# Patient Record
Sex: Male | Born: 1955 | Race: White | Hispanic: No | Smoking: Never smoker
Health system: Southern US, Community
[De-identification: ages and names within clinical notes are randomized; demographics above are authoritative.]

## PROBLEM LIST (undated history)

## (undated) DIAGNOSIS — M79673 Pain in unspecified foot: Secondary | ICD-10-CM

## (undated) DIAGNOSIS — E119 Type 2 diabetes mellitus without complications: Secondary | ICD-10-CM

## (undated) DIAGNOSIS — F419 Anxiety disorder, unspecified: Secondary | ICD-10-CM

## (undated) DIAGNOSIS — E7800 Pure hypercholesterolemia, unspecified: Secondary | ICD-10-CM

## (undated) DIAGNOSIS — B1920 Unspecified viral hepatitis C without hepatic coma: Secondary | ICD-10-CM

## (undated) HISTORY — DX: Unspecified viral hepatitis C without hepatic coma: B19.20

## (undated) HISTORY — DX: Pure hypercholesterolemia, unspecified: E78.00

## (undated) HISTORY — DX: Anxiety disorder, unspecified: F41.9

## (undated) HISTORY — PX: OTHER SURGICAL HISTORY: SHX169

## (undated) HISTORY — DX: Pain in unspecified foot: M79.673

## (undated) HISTORY — DX: Type 2 diabetes mellitus without complications: E11.9

---

## 2009-03-16 ENCOUNTER — Emergency Department (HOSPITAL_COMMUNITY): Admission: EM | Admit: 2009-03-16 | Discharge: 2009-03-16 | Payer: Self-pay | Admitting: Emergency Medicine

## 2009-05-02 ENCOUNTER — Encounter: Admission: RE | Admit: 2009-05-02 | Discharge: 2009-05-02 | Payer: Self-pay | Admitting: Family Medicine

## 2009-06-30 ENCOUNTER — Encounter: Admission: RE | Admit: 2009-06-30 | Discharge: 2009-06-30 | Payer: Self-pay | Admitting: Family Medicine

## 2010-04-13 ENCOUNTER — Emergency Department (HOSPITAL_COMMUNITY): Admission: EM | Admit: 2010-04-13 | Discharge: 2010-04-13 | Payer: Self-pay | Admitting: Emergency Medicine

## 2010-08-22 LAB — URINALYSIS, ROUTINE W REFLEX MICROSCOPIC
Bilirubin Urine: NEGATIVE
Glucose, UA: NEGATIVE mg/dL
Hgb urine dipstick: NEGATIVE
Ketones, ur: NEGATIVE mg/dL
Nitrite: NEGATIVE
Protein, ur: NEGATIVE mg/dL
Specific Gravity, Urine: 1.014 (ref 1.005–1.030)
Urobilinogen, UA: 1 mg/dL (ref 0.0–1.0)
pH: 6 (ref 5.0–8.0)

## 2010-08-22 LAB — CBC
HCT: 41 % (ref 39.0–52.0)
Hemoglobin: 14.3 g/dL (ref 13.0–17.0)
MCH: 32.2 pg (ref 26.0–34.0)
MCHC: 34.9 g/dL (ref 30.0–36.0)
MCV: 92.3 fL (ref 78.0–100.0)
Platelets: 103 K/uL — ABNORMAL LOW (ref 150–400)
RBC: 4.44 MIL/uL (ref 4.22–5.81)
RDW: 14 % (ref 11.5–15.5)
WBC: 4.9 10*3/uL (ref 4.0–10.5)

## 2010-08-22 LAB — DIFFERENTIAL
Basophils Absolute: 0 K/uL (ref 0.0–0.1)
Basophils Relative: 0 % (ref 0–1)
Eosinophils Absolute: 0.1 10*3/uL (ref 0.0–0.7)
Eosinophils Relative: 1 % (ref 0–5)
Lymphocytes Relative: 31 % (ref 12–46)
Lymphs Abs: 1.6 10*3/uL (ref 0.7–4.0)
Monocytes Absolute: 0.3 K/uL (ref 0.1–1.0)
Monocytes Relative: 6 % (ref 3–12)
Neutro Abs: 3 K/uL (ref 1.7–7.7)
Neutrophils Relative %: 61 % (ref 43–77)

## 2010-08-22 LAB — COMPREHENSIVE METABOLIC PANEL WITH GFR
Albumin: 2.8 g/dL — ABNORMAL LOW (ref 3.5–5.2)
Alkaline Phosphatase: 120 U/L — ABNORMAL HIGH (ref 39–117)
BUN: 6 mg/dL (ref 6–23)
Chloride: 106 meq/L (ref 96–112)
Creatinine, Ser: 0.77 mg/dL (ref 0.4–1.5)
Glucose, Bld: 82 mg/dL (ref 70–99)
Potassium: 3.2 meq/L — ABNORMAL LOW (ref 3.5–5.1)
Total Bilirubin: 0.9 mg/dL (ref 0.3–1.2)

## 2010-08-22 LAB — COMPREHENSIVE METABOLIC PANEL
ALT: 177 U/L — ABNORMAL HIGH (ref 0–53)
AST: 206 U/L — ABNORMAL HIGH (ref 0–37)
CO2: 26 mEq/L (ref 19–32)
Calcium: 8.6 mg/dL (ref 8.4–10.5)
GFR calc Af Amer: >60 mL/min (ref >60)
GFR calc non Af Amer: >60 mL/min (ref >60)
Sodium: 138 mEq/L (ref 135–145)
Total Protein: 7.9 g/dL (ref 6.0–8.3)

## 2010-08-22 LAB — LIPASE, BLOOD: Lipase: 41 U/L (ref 11–59)

## 2010-08-22 LAB — GLUCOSE, CAPILLARY: Glucose-Capillary: 76 mg/dL (ref 70–99)

## 2011-03-19 DIAGNOSIS — F329 Major depressive disorder, single episode, unspecified: Secondary | ICD-10-CM | POA: Insufficient documentation

## 2011-06-06 DIAGNOSIS — D61818 Other pancytopenia: Secondary | ICD-10-CM | POA: Insufficient documentation

## 2011-08-14 DIAGNOSIS — E1140 Type 2 diabetes mellitus with diabetic neuropathy, unspecified: Secondary | ICD-10-CM | POA: Insufficient documentation

## 2011-08-14 DIAGNOSIS — B351 Tinea unguium: Secondary | ICD-10-CM | POA: Insufficient documentation

## 2011-08-20 DIAGNOSIS — E291 Testicular hypofunction: Secondary | ICD-10-CM | POA: Insufficient documentation

## 2011-09-12 IMAGING — CR DG WRIST COMPLETE 3+V*R*
4 series · 4 of 4 positions shown · non-contrast
Comparison: None available.

CLINICAL DATA: Status post fall.  Pain.

RIGHT WRIST - COMPLETE 3+ VIEW

[x wrist pa right]
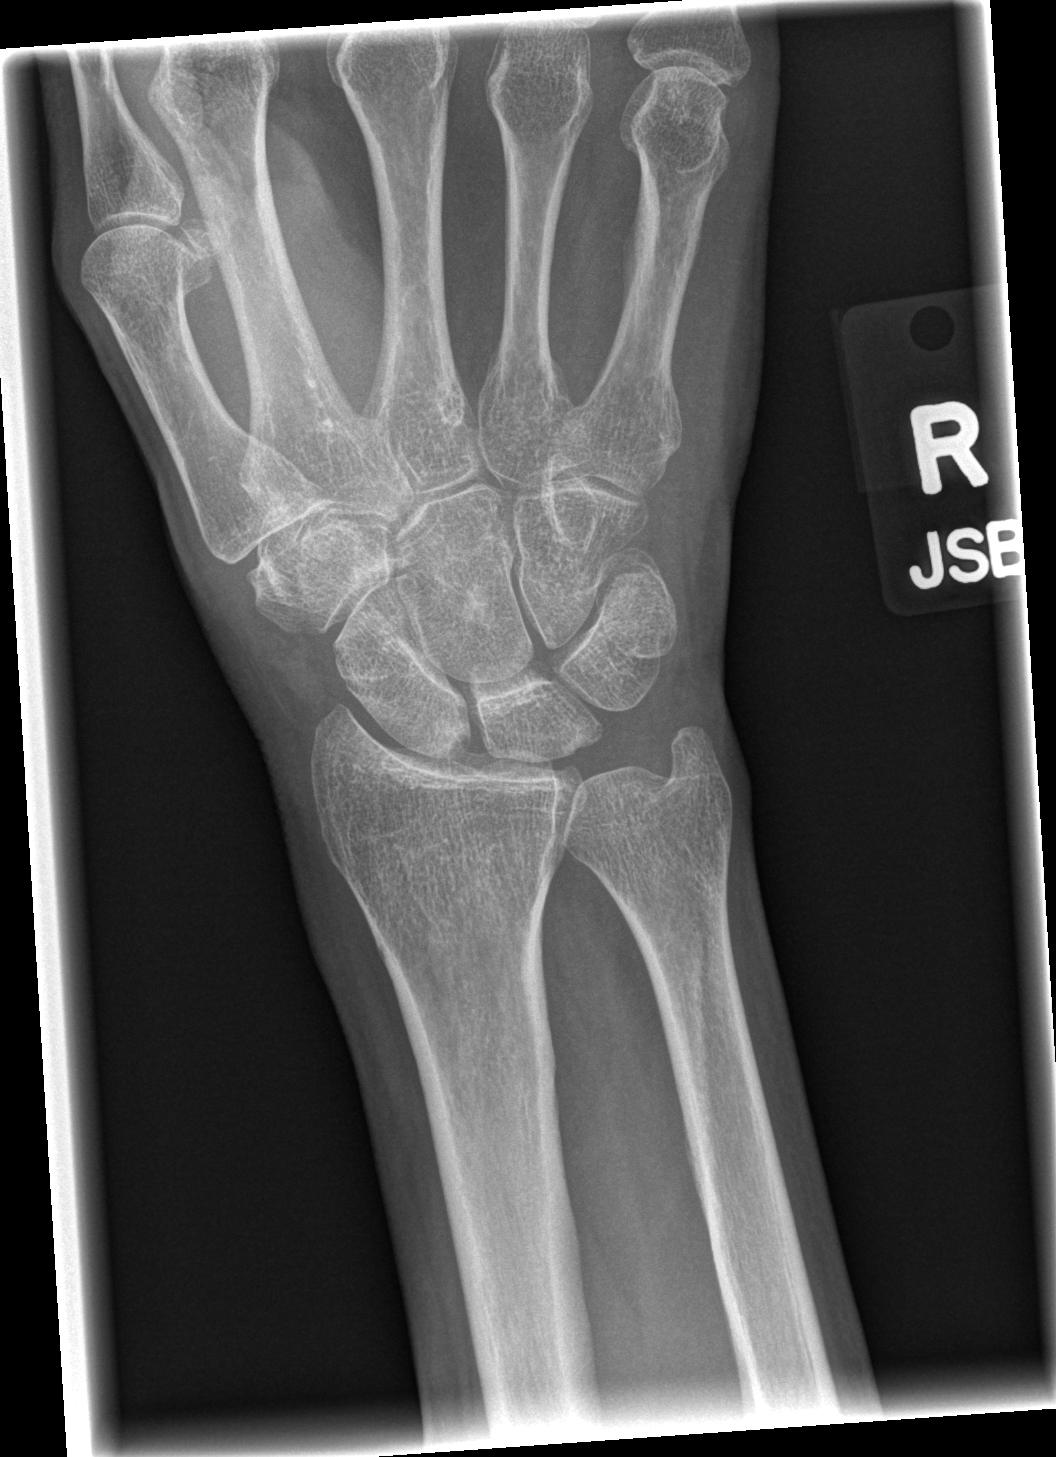

[x wrist obl right]
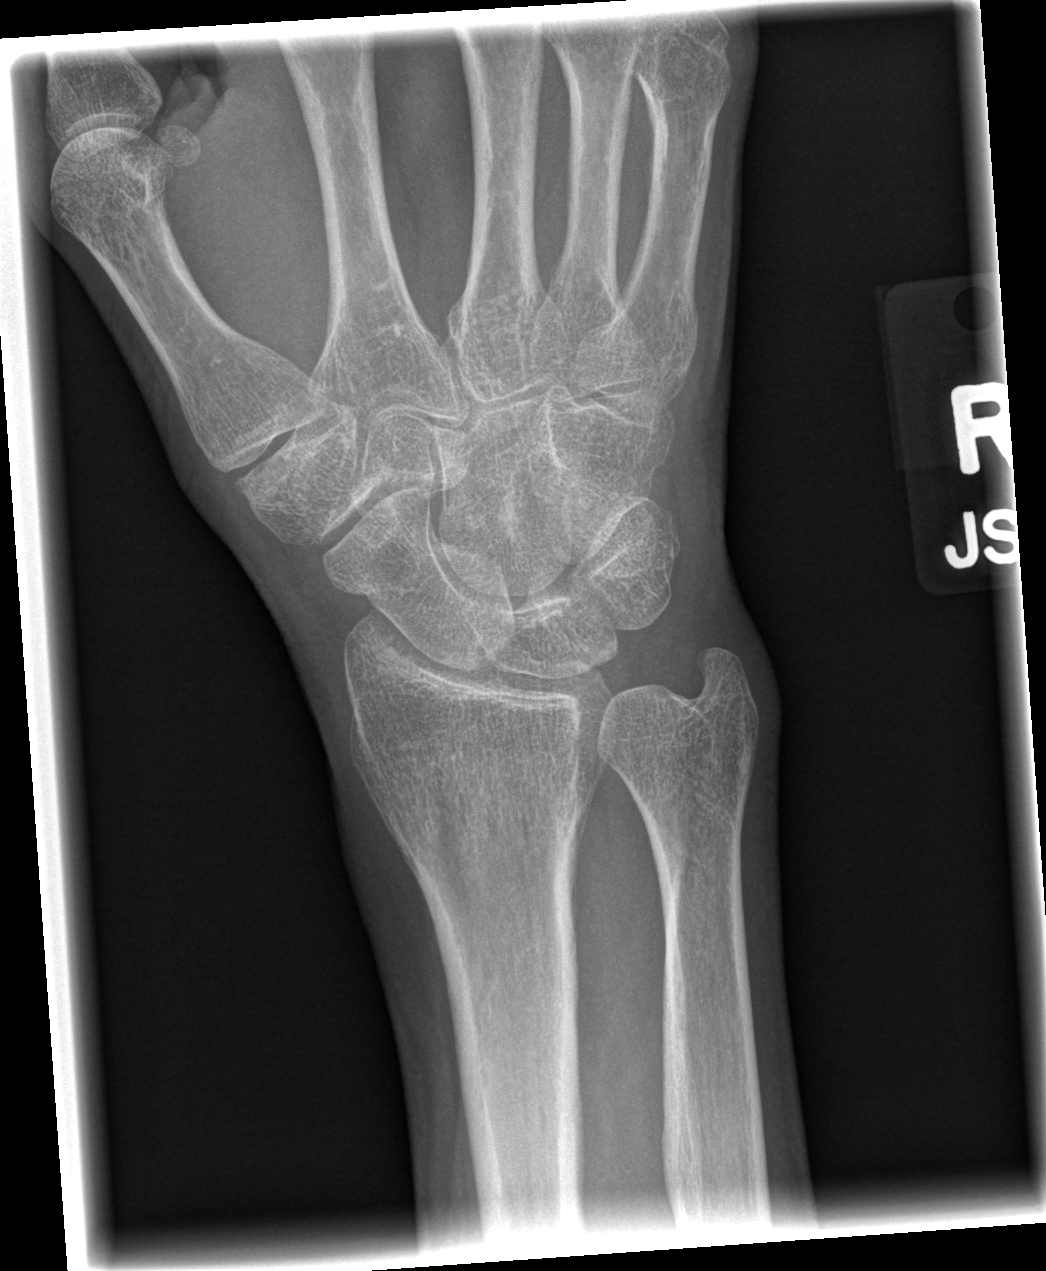

[x wrist lat right]
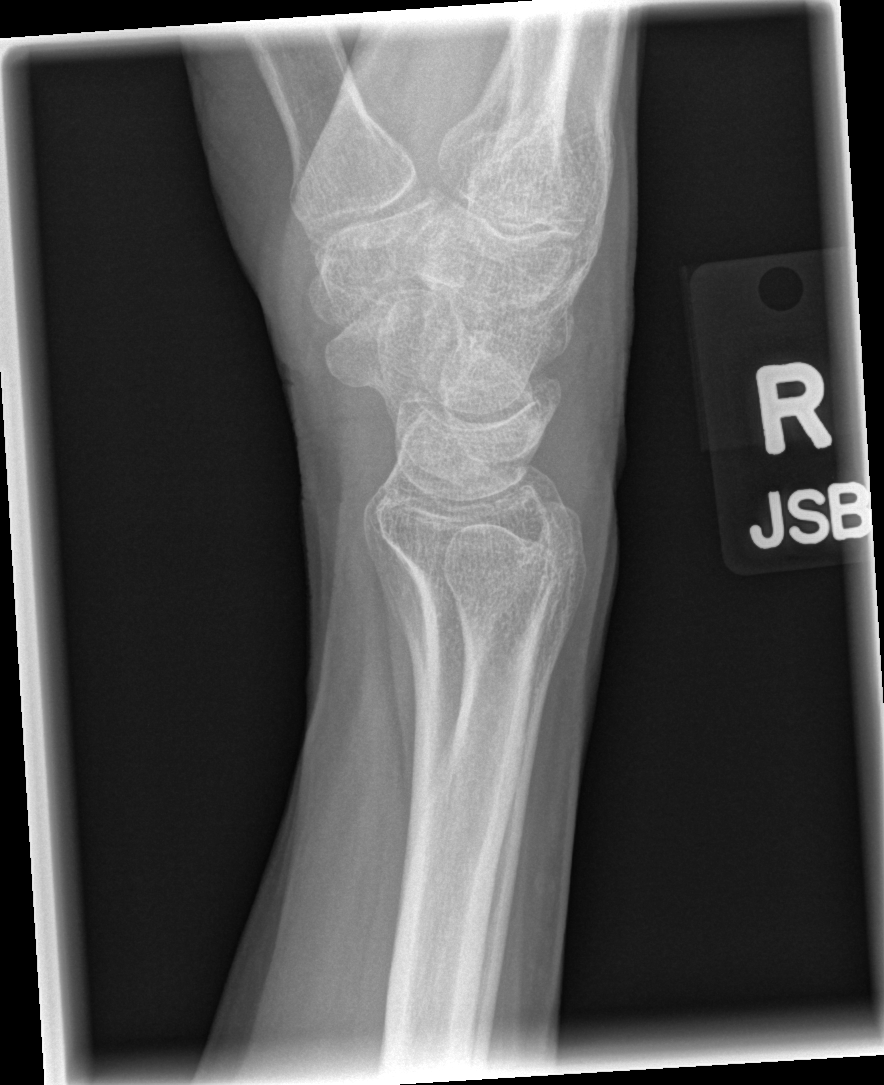

[x navicular]
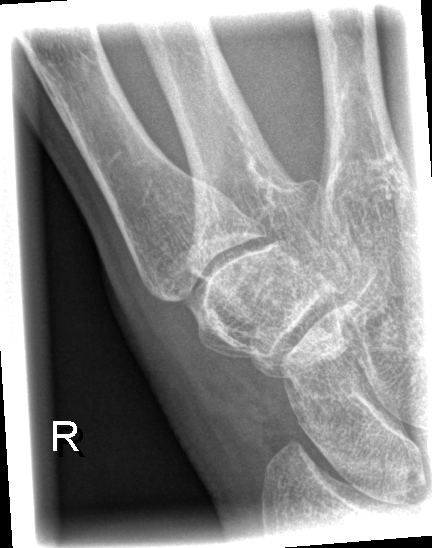

[4 of 4 positions shown; findings below may reference images not displayed]

FINDINGS: No fracture or dislocation is identified.  Bones are
osteopenic.  There is some degenerative disease of the first CMC
joint.
IMPRESSION: 1.  No acute finding.
2.  Osteopenia.
3.  First CMC osteoarthritis.

## 2012-03-17 DIAGNOSIS — K921 Melena: Secondary | ICD-10-CM | POA: Insufficient documentation

## 2012-05-14 DIAGNOSIS — E559 Vitamin D deficiency, unspecified: Secondary | ICD-10-CM | POA: Insufficient documentation

## 2012-10-17 DIAGNOSIS — R252 Cramp and spasm: Secondary | ICD-10-CM | POA: Insufficient documentation

## 2012-12-01 DIAGNOSIS — L089 Local infection of the skin and subcutaneous tissue, unspecified: Secondary | ICD-10-CM | POA: Insufficient documentation

## 2013-06-12 ENCOUNTER — Telehealth: Payer: Self-pay | Admitting: Internal Medicine

## 2013-06-12 NOTE — Telephone Encounter (Signed)
S/W PT AND GVE NP APPT 01/20 @ 10:30 W/DR. CHISM REFERRING DR. Melina SchoolsOBYN [name] DX- HEMOCHROMATOSIS WELCOME PACKET MAILED.

## 2013-06-15 ENCOUNTER — Telehealth: Payer: Self-pay | Admitting: Internal Medicine

## 2013-06-15 NOTE — Telephone Encounter (Signed)
C/D 06/15/13 for appt. 06/30/13

## 2013-06-29 ENCOUNTER — Encounter: Payer: Self-pay | Admitting: Neurology

## 2013-06-29 ENCOUNTER — Ambulatory Visit (INDEPENDENT_AMBULATORY_CARE_PROVIDER_SITE_OTHER): Payer: Medicaid Other | Admitting: Neurology

## 2013-06-29 VITALS — BP 111/77 | HR 64 | Ht 72.0 in | Wt 163.0 lb

## 2013-06-29 DIAGNOSIS — B1920 Unspecified viral hepatitis C without hepatic coma: Secondary | ICD-10-CM

## 2013-06-29 DIAGNOSIS — F419 Anxiety disorder, unspecified: Secondary | ICD-10-CM | POA: Insufficient documentation

## 2013-06-29 DIAGNOSIS — E119 Type 2 diabetes mellitus without complications: Secondary | ICD-10-CM

## 2013-06-29 DIAGNOSIS — F411 Generalized anxiety disorder: Secondary | ICD-10-CM

## 2013-06-29 DIAGNOSIS — E7800 Pure hypercholesterolemia, unspecified: Secondary | ICD-10-CM | POA: Insufficient documentation

## 2013-06-29 NOTE — Progress Notes (Signed)
[facility]  PATIENT: John [name] DOB: May 17, 1956  HISTORICAL  John [name] is a 58 years old Caucasian male, referred by his primary care physician Dr. 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 Multiple echogenic nodules within a heterogenous liver parenchyma. Cholelithiasis with tumefactive sludge. Persistent gallbladder wall thickening is likely related to underlying liver disease.   He was previously evaluated by neurologists at [facility], was given prescription of gabapentin, which does help him some    He also complains of right hip pain, difficulty crossing his legs because of hip pain, radiating pain to his right groin area   REVIEW OF SYSTEMS: Full 14 system review of systems performed and notable only for feeling cold, confusion, numbness, weakness, cramps, insomnia, restless leg, not enough sleep, decreased energy, change in appetite  ALLERGIES: No Known Allergies  PAST MEDICAL HISTORY: Past Medical History  Diagnosis Date  . Diabetes   . High cholesterol   . Anxiety   . Hepatitis C   hereditary hemochromatosis   PAST SURGICAL HISTORY: Past Surgical History    Procedure Laterality Date  . None      FAMILY HISTORY: Adopted  SOCIAL HISTORY:  History   Social History  . Marital Status: Single    Spouse Name: N/A    Number of Children: 4  . Years of Education: 70   Occupational History    Disabled in 2007   Social History Main Topics  . Smoking status: Never Smoker   . Smokeless tobacco: Never Used  . Alcohol Use: No     Comment: Quit 2007  . Drug Use: No     Comment: Quit 1980  . Sexual Activity: Not on file   Other Topics Concern  . Not on file   Social History Narrative   Patient lives at home with his girlfriend.    Patient is disabled.   Right handed.   Caffeine- 1-2 cups of caffeine daily.   Education- two years of college.          PHYSICAL EXAM   Filed Vitals:   06/29/13 1052  BP: 111/77  Pulse: 64  Height: 6' (1.829 m)  Weight: 163 lb (73.936 kg)     Body mass index is 22.1 kg/(m^2).   Generalized: In no acute distress  Neck: Supple, no carotid bruits   Cardiac: Regular rate rhythm  Pulmonary: Clear to auscultation bilaterally  Musculoskeletal: No deformity  Neurological examination  Mentation: unkempt, Alert oriented to time, place, history taking, and causual conversation  Cranial nerve II-XII: Pupils were equal round reactive to light extraocular movements were full, Visual field were full on confrontational test. Bilateral fundi were sharp.  Facial sensation and strength were normal. Hearing was intact to finger rubbing bilaterally. Uvula tongue midline.  head  turning and shoulder shrug and were normal and symmetric.Tongue protrusion into cheek strength was normal.  Motor: normal tone, bulk and strength.  Sensory: Intact to fine touch, pinprick, preserved vibratory sensation, and proprioception at toes.  Coordination: Normal finger to nose, heel-to-shin bilaterally there was no truncal ataxia  Gait: Rising up from seated position without assistance narrow-base, mild atalgic due to  right hip pain  Romberg signs: Negative  Deep tendon reflexes: Brachioradialis 2/2, biceps 2/2, triceps 2/2, patellar 2/2, Achilles 2/2, plantar responses were flexor bilaterally.   DIAGNOSTIC DATA (LABS, IMAGING, TESTING) - I reviewed patient records, labs, notes, testing and imaging myself where available.  Lab Results  Component Value Date   WBC 4.9 04/13/2010   HGB 14.3 04/13/2010   HCT 41.0 04/13/2010   MCV 92.3 04/13/2010   PLT 103* 04/13/2010      Component Value Date/Time   NA 138 04/13/2010 1417   K 3.2* 04/13/2010 1417   CL 106 04/13/2010 1417   CO2 26 04/13/2010 1417   GLUCOSE 82 04/13/2010 1417   BUN 6 04/13/2010 1417   CREATININE 0.77 04/13/2010 1417   CALCIUM 8.6 04/13/2010 1417   PROT 7.9 04/13/2010 1417   ALBUMIN 2.8* 04/13/2010 1417   AST 206* 04/13/2010 1417   ALT 177* 04/13/2010 1417   ALKPHOS 120* 04/13/2010 1417   BILITOT 0.9 04/13/2010 1417   GFRNONAA >60 04/13/2010 1417   GFRAA  Value: >60        The eGFR has been calculated using the MDRD equation. This calculation has not been validated in all clinical situations. eGFR's persistently <60 mL/min signify possible Chronic Kidney Disease. 04/13/2010 1417     ASSESSMENT AND PLAN    58 years old Caucasian male, presenting with frequent motor and voice tics, in the setting of hemochromatosis, stage IV hepatic cirrhosis, hepatitis C,   1.  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  .    4 right hip pain, x-ray of right hip 5.Return to clinic in 2-3 months.     Marcial Pacas, M.D. Ph.D.  [facility] [address], [place], [place] [address] [phone]

## 2013-06-30 ENCOUNTER — Ambulatory Visit: Payer: Medicaid Other

## 2013-06-30 ENCOUNTER — Other Ambulatory Visit (HOSPITAL_BASED_OUTPATIENT_CLINIC_OR_DEPARTMENT_OTHER): Payer: Medicaid Other

## 2013-06-30 ENCOUNTER — Encounter: Payer: Self-pay | Admitting: Internal Medicine

## 2013-06-30 ENCOUNTER — Other Ambulatory Visit: Payer: Self-pay | Admitting: Internal Medicine

## 2013-06-30 ENCOUNTER — Ambulatory Visit (HOSPITAL_BASED_OUTPATIENT_CLINIC_OR_DEPARTMENT_OTHER): Payer: Medicaid Other | Admitting: Internal Medicine

## 2013-06-30 ENCOUNTER — Telehealth: Payer: Self-pay | Admitting: Internal Medicine

## 2013-06-30 DIAGNOSIS — K7460 Unspecified cirrhosis of liver: Secondary | ICD-10-CM

## 2013-06-30 DIAGNOSIS — E7800 Pure hypercholesterolemia, unspecified: Secondary | ICD-10-CM

## 2013-06-30 DIAGNOSIS — B1920 Unspecified viral hepatitis C without hepatic coma: Secondary | ICD-10-CM

## 2013-06-30 DIAGNOSIS — B182 Chronic viral hepatitis C: Secondary | ICD-10-CM

## 2013-06-30 DIAGNOSIS — E119 Type 2 diabetes mellitus without complications: Secondary | ICD-10-CM

## 2013-06-30 LAB — COMPREHENSIVE METABOLIC PANEL (CC13)
ALBUMIN: 2.3 g/dL — AB (ref 3.5–5.0)
ALK PHOS: 171 U/L — AB (ref 40–150)
ALT: 103 U/L — ABNORMAL HIGH (ref 0–55)
ANION GAP: 4 meq/L (ref 3–11)
AST: 137 U/L — ABNORMAL HIGH (ref 5–34)
BILIRUBIN TOTAL: 0.74 mg/dL (ref 0.20–1.20)
BUN: 11.3 mg/dL (ref 7.0–26.0)
CO2: 29 mEq/L (ref 22–29)
Calcium: 8.7 mg/dL (ref 8.4–10.4)
Chloride: 100 mEq/L (ref 98–109)
Creatinine: 0.9 mg/dL (ref 0.7–1.3)
GLUCOSE: 373 mg/dL — AB (ref 70–140)
Potassium: 4.5 mEq/L (ref 3.5–5.1)
Sodium: 133 mEq/L — ABNORMAL LOW (ref 136–145)
Total Protein: 7.4 g/dL (ref 6.4–8.3)

## 2013-06-30 LAB — CBC WITH DIFFERENTIAL/PLATELET
BASO%: 0 % (ref 0.0–2.0)
BASOS ABS: 0 10*3/uL (ref 0.0–0.1)
EOS%: 3.3 % (ref 0.0–7.0)
Eosinophils Absolute: 0.1 10*3/uL (ref 0.0–0.5)
HCT: 39.6 % (ref 38.4–49.9)
HGB: 13.7 g/dL (ref 13.0–17.1)
LYMPH#: 1 10*3/uL (ref 0.9–3.3)
LYMPH%: 30.5 % (ref 14.0–49.0)
MCH: 33.1 pg (ref 27.2–33.4)
MCHC: 34.6 g/dL (ref 32.0–36.0)
MCV: 95.7 fL (ref 79.3–98.0)
MONO#: 0.3 10*3/uL (ref 0.1–0.9)
MONO%: 9.6 % (ref 0.0–14.0)
NEUT%: 56.6 % (ref 39.0–75.0)
NEUTROS ABS: 1.9 10*3/uL (ref 1.5–6.5)
Platelets: DECREASED 10*3/uL (ref 140–400)
RBC: 4.14 10*6/uL — AB (ref 4.20–5.82)
RDW: 14.6 % (ref 11.0–14.6)
WBC: 3.3 10*3/uL — AB (ref 4.0–10.3)
nRBC: 0 % (ref 0–0)

## 2013-06-30 LAB — IRON AND TIBC CHCC
%SAT: 61 % — ABNORMAL HIGH (ref 20–55)
IRON: 196 ug/dL — AB (ref 42–163)
TIBC: 324 ug/dL (ref 202–409)
UIBC: 128 ug/dL (ref 117–376)

## 2013-06-30 LAB — LACTATE DEHYDROGENASE (CC13): LDH: 252 U/L — ABNORMAL HIGH (ref 125–245)

## 2013-06-30 LAB — FERRITIN CHCC: FERRITIN: 379 ng/mL — AB (ref 22–316)

## 2013-06-30 NOTE — Telephone Encounter (Signed)
Gave pt appt for lab and md for february 2015

## 2013-06-30 NOTE — Patient Instructions (Signed)
Hemochromatosis You have hemochromatosis. This is when you have too much iron in your body. Iron is necessary in your body for many reasons. Iron works in the hemoglobin (a protein in red blood cells) which carries oxygen to your body. However, too much iron can lead to long-term health problems. When the iron builds up in the body it affects the liver, heart, and pancreas. This can lead to heart failure, diabetes, cirrhosis, cancer, impotence, and depression. Fatigue is a common complaint. Additional symptoms include headache, shortness of breath, heart irregularities, and tanning skin. The most common cause of this disease is hereditary (passed from parents). Another cause is a diet that contains too much iron. This can happen to people who eat a lot of red meat, take supplemental iron, take vitamin C, and drink alcohol. Frequent transfusions may also lead to iron overload. DIAGNOSIS  Three simple blood tests will usually make the diagnosis of hemochromatosis.  A transferrin iron saturation percentage test will measure your ability to circulate iron.  A ferritin test will measure the amount of iron you have in storage.  A hemoglobin test will measure how much iron is circulating in your blood. HOME CARE INSTRUCTIONS  Follow the diet provided for you by your caregiver. The recommended daily allowance for iron is:  15 mg per day for women ages 17 to 17.  30 mg per day for women who are pregnant or lactating.  10 mg per day for men and women ages 54 and older. It is important to follow your caregiver's instructions because you can lead a normal life with treatment. If left untreated, this disease can be life-threatening. MAKE SURE YOU:   Understand these instructions.  Will watch your condition.  Will get help right away if you are not doing well or get worse. Document Released: 05/25/2000 Document Revised: 08/20/2011 Document Reviewed: 05/28/2005 [facility] Patient Information 2014  [place], [place].  Therapeutic Phlebotomy Therapeutic phlebotomy is the controlled removal of blood from your body for the purpose of treating a medical condition. It is similar to donating blood. Usually, about a pint (470 mL) of blood is removed. The average adult has 9 to 12 pints (4.3 to 5.7 L) of blood. Therapeutic phlebotomy may be used to treat the following medical conditions:  Hemochromatosis. This is a condition in which there is too much iron in the blood.  Polycythemia vera. This is a condition in which there are too many red cells in the blood.  Porphyria cutanea tarda. This is a disease usually passed from one generation to the next (inherited). It is a condition in which an important part of hemoglobin is not made properly. This results in the build up of abnormal amounts of porphyrins in the body.  Sickle cell disease. This is an inherited disease. It is a condition in which the red blood cells form an abnormal crescent shape rather than a round shape. LET YOUR CAREGIVER KNOW ABOUT:  Allergies.  Medicines taken including herbs, eyedrops, over-the-counter medicines, and creams.  Use of steroids (by mouth or creams).  Previous problems with anesthetics or numbing medicine.  History of blood clots.  History of bleeding or blood problems.  Previous surgery.  Possibility of pregnancy, if this applies. RISKS AND COMPLICATIONS This is a simple and safe procedure. Problems are unlikely. However, problems can occur and may include:  Nausea or lightheadedness.  Low blood pressure.  Soreness, bleeding, swelling, or bruising at the needle insertion site.  Infection. BEFORE THE PROCEDURE  This is  a procedure that can be done as an outpatient. Confirm the time that you need to arrive for your procedure. Confirm whether there is a need to fast or withhold any medications. It is helpful to wear clothing with sleeves that can be raised above the elbow. A blood sample may be done  to determine the amount of red blood cells or iron in your blood. Plan ahead of time to have someone drive you home after the procedure. PROCEDURE The entire procedure from preparation through recovery takes about 1 hour. The actual collection takes about 10 to 15 minutes.  A needle will be inserted into your vein.  Tubing and a collection bag will be attached to that needle.  Blood will flow through the needle and tubing into the collection bag.  You may be asked to open and close your hand slowly and continuously during the entire collection.  Once the specified amount of blood has been removed from your body, the collection bag and tubing will be clamped.  The needle will be removed.  Pressure will be held on the site of the needle insertion to stop the bleeding. Then a bandage will be placed over the needle insertion site. AFTER THE PROCEDURE  Your recovery will be assessed and monitored. If there are no problems, as an outpatient, you should be able to go home shortly after the procedure.  Document Released: 10/30/2010 Document Revised: 08/20/2011 Document Reviewed: 10/30/2010 [facility] Patient Information 2014 [place]

## 2013-06-30 NOTE — Progress Notes (Signed)
Checked in new patient with no financial issues .he has appt card and [place] access.

## 2013-07-01 ENCOUNTER — Telehealth: Payer: Self-pay | Admitting: Internal Medicine

## 2013-07-01 ENCOUNTER — Telehealth: Payer: Self-pay | Admitting: *Deleted

## 2013-07-01 DIAGNOSIS — R251 Tremor, unspecified: Secondary | ICD-10-CM | POA: Insufficient documentation

## 2013-07-01 NOTE — Telephone Encounter (Signed)
s.w. pt and advised on 1.22.15 and 2.3.15 appt....pt ok adn aware

## 2013-07-01 NOTE — Telephone Encounter (Signed)
Per staff message and POF I have scheduled appts.  JMW  

## 2013-07-02 ENCOUNTER — Ambulatory Visit (HOSPITAL_BASED_OUTPATIENT_CLINIC_OR_DEPARTMENT_OTHER): Payer: Medicaid Other

## 2013-07-02 MED ORDER — SODIUM CHLORIDE 0.9 % IV SOLN
Freq: Once | INTRAVENOUS | Status: AC
  Administered 2013-07-02: 10:00:00 via INTRAVENOUS

## 2013-07-02 NOTE — Progress Notes (Signed)
[facility] [phone]   [phone]  NEW PATIENT EVALUATION   Name: John [name] Date: 07/02/2013 MRN: [id number] DOB: 27-Dec-1955  PCP: Alva Garnet., MD   REFERRING PHYSICIAN: Alva Garnet., MD [phone]  REASON FOR REFERRAL: Hereditary Hemochromatosis    HISTORY OF PRESENT ILLNESS:John [name] is a 58 y.o. male who is being referred for further evaluation and management for his hereditary hemochromatosis.Of note patient also has a history of hepatitis C and DM2 on insulin.  He was managed by Dr. Gardiner Coins from 2008 till presently.  He desires to follow locally for his hematology requiring bi-weekly therapeutic phlebotomy.  He noted phlebotomy times would be in excess five hours and made long commute times from [place] to [place].  Most of his care including his endocrinology and neurology remains at [facility].    He reports that he obtains therapeutic phlebotomy for hereditary hemochromatosis but notes elevated ferritin.  He also goes to [facility].  He has a history of essential tremors and goes to neurologist at [facility].  The hemachromatosis was diagnosed 7 years ago.  He also has diabetes managed by endocrinology.  He denies any recent hypoglycemic episodes.  He notes hyperglycemia recently.  His diabetes was diagnosed in 2007.    Today, he reports a limp due to his right leg hurting.  He has a right hip x-ray planned. In addition, his MR of brain is scheduled for 20th of April.  He reports an EGD done 2 years ago with varices reported.  He has been on nadolol.  His hepatitis C is managed by Dr. Dahlia Client with biannual ultrasound.  His HBA1C was 8.4.  He reports taking lactulose as needed.  He has not needed it in about one year.  He was diagnosed with the above when he moved from [place] in 2007.  He is adopted and does not know his family history.  He lives with his significant other of 13 years.  He has four children.  His  last hospitalization was during Christmas 2013 for pneumonia.    PAST MEDICAL HISTORY:  has a past medical history of Diabetes; High cholesterol; Anxiety; and Hepatitis C.     PAST SURGICAL HISTORY: Past Surgical History  Procedure Laterality Date  . None       CURRENT MEDICATIONS: has a current medication list which includes the following prescription(s): gabapentin, glucagon, insulin aspart, insulin glargine, and nadolol.   ALLERGIES: Sulfa antibiotics and Testosterone   SOCIAL HISTORY:  reports that he has never smoked. He has never used smokeless tobacco. He reports that he does not drink alcohol or use illicit drugs.   FAMILY HISTORY: family history is not on file.   LABORATORY DATA:  Results for orders placed in visit on 06/30/13 (from the past 48 hour(s))  CBC WITH DIFFERENTIAL     Status: Abnormal   Collection Time    06/30/13 10:33 AM      Result Value Range   WBC 3.3 (*) 4.0 - 10.3 10e3/uL   NEUT# 1.9  1.5 - 6.5 10e3/uL   HGB 13.7  13.0 - 17.1 g/dL   HCT 30.8  65.7 - 84.6 %   Platelets Clumped Platelets--Appears Decreased  140 - 400 10e3/uL   MCV 95.7  79.3 - 98.0 fL   MCH 33.1  27.2 - 33.4 pg   MCHC 34.6  32.0 - 36.0 g/dL   RBC 9.62 (*) 9.52 - 8.41 10e6/uL   RDW 14.6  11.0 -  14.6 %   lymph# 1.0  0.9 - 3.3 10e3/uL   MONO# 0.3  0.1 - 0.9 10e3/uL   Eosinophils Absolute 0.1  0.0 - 0.5 10e3/uL   Basophils Absolute 0.0  0.0 - 0.1 10e3/uL   NEUT% 56.6  39.0 - 75.0 %   LYMPH% 30.5  14.0 - 49.0 %   MONO% 9.6  0.0 - 14.0 %   EOS% 3.3  0.0 - 7.0 %   BASO% 0.0  0.0 - 2.0 %   nRBC 0  0 - 0 %  IRON AND TIBC [facility]     Status: Abnormal   Collection Time    06/30/13 10:33 AM      Result Value Range   Iron 196 (*) 42 - 163 ug/dL   TIBC 829324  562202 - 130409 ug/dL   UIBC 865128  784117 - 696376 ug/dL   %SAT 61 (*) 20 - 55 %  FERRITIN [facility]     Status: Abnormal   Collection Time    06/30/13 10:33 AM      Result Value Range   Ferritin 379 (*) 22 - 316 ng/ml  COMPREHENSIVE METABOLIC  PANEL ([id number])     Status: Abnormal   Collection Time    06/30/13 10:33 AM      Result Value Range   Sodium 133 (*) 136 - 145 mEq/L   Potassium 4.5  3.5 - 5.1 mEq/L   Chloride 100  98 - 109 mEq/L   CO2 29  22 - 29 mEq/L   Glucose 373 (*) 70 - 140 mg/dl   BUN 84.111.3  7.0 - 32.426.0 mg/dL   Creatinine 0.9  0.7 - 1.3 mg/dL   Total Bilirubin 4.010.74  0.20 - 1.20 mg/dL   Alkaline Phosphatase 171 (*) 40 - 150 U/L   AST 137 (*) 5 - 34 U/L   ALT 103 (*) 0 - 55 U/L   Total Protein 7.4  6.4 - 8.3 g/dL   Albumin 2.3 (*) 3.5 - 5.0 g/dL   Calcium 8.7  8.4 - 02.710.4 mg/dL   Anion Gap 4  3 - 11 mEq/L  LACTATE DEHYDROGENASE (CC13)     Status: Abnormal   Collection Time    06/30/13 10:33 AM      Result Value Range   LDH 252 (*) 125 - 245 U/L       RADIOGRAPHY: No results found.     REVIEW OF SYSTEMS:  Constitutional: Denies fevers, chills or abnormal weight loss Eyes: Denies blurriness of vision Ears, nose, mouth, throat, and face: Denies mucositis or sore throat Respiratory: Denies cough, dyspnea or wheezes Cardiovascular: Denies palpitation, chest discomfort or lower extremity swelling Gastrointestinal:  Denies nausea, heartburn or change in bowel habits Skin: Denies abnormal skin rashes Lymphatics: Denies new lymphadenopathy or easy bruising Neurological:Denies numbness, tingling or new weaknesses Behavioral/Psych: Mood is stable, no new changes  All other systems were reviewed with the patient and are negative.  PHYSICAL EXAM:  height is 6' (1.829 m) and weight is 159 lb 6.4 oz (72.303 kg). His oral temperature is 97.9 F (36.6 C). His blood pressure is 123/71 and his pulse is 61. His respiration is 18 and oxygen saturation is 100%.    GENERAL:alert, no distress and comfortable; well developed and well nourished; anxious and wears beard SKIN: skin color, texture, turgor are normal, no rashes or significant lesions EYES: normal, Conjunctiva are pink and non-injected, sclera clear OROPHARYNX:no  exudate, no erythema and lips, buccal mucosa, and tongue normal  NECK: supple, thyroid normal size, non-tender, without nodularity LYMPH:  no palpable lymphadenopathy in the cervical, axillary or inguinal LUNGS: clear to auscultation and percussion with normal breathing effort HEART: regular rate & rhythm and no murmurs and no lower extremity edema ABDOMEN:abdomen soft, non-tender and normal bowel sounds; enlarged spleen about 2 cm below costaphrenic angle.  No additional stigmata of liver disease.  Musculoskeletal:no cyanosis of digits and no clubbing  NEURO: alert & oriented x 3 with fluent speech, no focal motor/sensory deficits   IMPRESSION: John [name] is a 58 y.o. male with a history of    PLAN: 1.  Hereditary hemochromatosis. --We will request his hematology records from Dr. Alvera Singh office.  We discuss maintaining his ferritin less than 50.   We will facilitate therapeutic phlebotomy in the next day or so.  Patient reports that his needs normal saline with his phlebotomy.  We will continue phlebotomy biweekly as tolerated.   2. DM2 secondary #1.  --Continue insulin sliding scale and follow-up with [facility].    3. 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 --Per his report, he has a follow up with GI to discuss treatment.  We will follow closely.   --Continue nadolol.  He was counseled to report any bleeding.   4. Follow-up.  --Labs including CBC,ferritin biweekly with phlebotomy if ferritin greater than 50; Labs including CBC, CMP in 4 weeks upon return visit.   All questions were answered. The patient knows to call the clinic with any problems, questions or concerns. We can certainly see the patient much sooner if necessary.  I spent 25 minutes counseling the patient face to face. The total time spent in the appointment was 45 minutes.    Jourdan Durbin, MD 07/02/2013 6:14 AM

## 2013-07-02 NOTE — Progress Notes (Signed)
Pt in today for therapuetic phlebotomy. Pt received 500 ml NS over 1 hour per Dr Rosie Fatehism v/o/rb. 500 g blood phlebotomized via 18 g angio cath to rt ac. Pt tolerated procedure well and is without complaints. Phlebotomy time: 10:50-11:05 11:35 Pt's V/S stable/Pt discharged alert and ambulatory and is without complaints.

## 2013-07-02 NOTE — Patient Instructions (Signed)
Therapeutic Phlebotomy Therapeutic phlebotomy is the controlled removal of blood from your body for the purpose of treating a medical condition. It is similar to donating blood. Usually, about a pint (470 mL) of blood is removed. The average adult has 9 to 12 pints (4.3 to 5.7 L) of blood. Therapeutic phlebotomy may be used to treat the following medical conditions:  Hemochromatosis. This is a condition in which there is too much iron in the blood.  Polycythemia vera. This is a condition in which there are too many red cells in the blood.  Porphyria cutanea tarda. This is a disease usually passed from one generation to the next (inherited). It is a condition in which an important part of hemoglobin is not made properly. This results in the build up of abnormal amounts of porphyrins in the body.  Sickle cell disease. This is an inherited disease. It is a condition in which the red blood cells form an abnormal crescent shape rather than a round shape. LET YOUR CAREGIVER KNOW ABOUT:  Allergies.  Medicines taken including herbs, eyedrops, over-the-counter medicines, and creams.  Use of steroids (by mouth or creams).  Previous problems with anesthetics or numbing medicine.  History of blood clots.  History of bleeding or blood problems.  Previous surgery.  Possibility of pregnancy, if this applies. RISKS AND COMPLICATIONS This is a simple and safe procedure. Problems are unlikely. However, problems can occur and may include:  Nausea or lightheadedness.  Low blood pressure.  Soreness, bleeding, swelling, or bruising at the needle insertion site.  Infection. BEFORE THE PROCEDURE  This is a procedure that can be done as an outpatient. Confirm the time that you need to arrive for your procedure. Confirm whether there is a need to fast or withhold any medications. It is helpful to wear clothing with sleeves that can be raised above the elbow. A blood sample may be done to determine the  amount of red blood cells or iron in your blood. Plan ahead of time to have someone drive you home after the procedure. PROCEDURE The entire procedure from preparation through recovery takes about 1 hour. The actual collection takes about 10 to 15 minutes.  A needle will be inserted into your vein.  Tubing and a collection bag will be attached to that needle.  Blood will flow through the needle and tubing into the collection bag.  You may be asked to open and close your hand slowly and continuously during the entire collection.  Once the specified amount of blood has been removed from your body, the collection bag and tubing will be clamped.  The needle will be removed.  Pressure will be held on the site of the needle insertion to stop the bleeding. Then a bandage will be placed over the needle insertion site. AFTER THE PROCEDURE  Your recovery will be assessed and monitored. If there are no problems, as an outpatient, you should be able to go home shortly after the procedure.  Document Released: 10/30/2010 Document Revised: 08/20/2011 Document Reviewed: 10/30/2010 [employer] Patient Information 2014 [employer].  

## 2013-07-08 ENCOUNTER — Telehealth: Payer: Self-pay | Admitting: Neurology

## 2013-07-08 NOTE — Telephone Encounter (Signed)
Message copied by Levert FeinsteinYAN, Elleigh Cassetta on Wed Jul 08, 2013  4:26 PM ------      Message from: Cindie LarocheSIMPKINS, KATHERINE V      Created: Wed Jul 08, 2013  3:12 PM      Regarding: MRI Denial       Medicaid denied the MRI Brain WO stating their clinical guidelines do not deem it necessary to perform MRI Brain for movement disorder unless there are Parkinson's symptoms.  If you would like to do a peer to peer you can call [employer] at [phone] option 4 and reference case # [id number].            Thanks,      Dorathy DaftKayla ------

## 2013-07-08 NOTE — Telephone Encounter (Signed)
Dana:  Please help me with peer to peer review.

## 2013-07-09 NOTE — Telephone Encounter (Signed)
I have done peer-to-peer reveal, patient is approved for MRI of the brain, the confirmation member will be faxed over within 12 hours.

## 2013-07-10 ENCOUNTER — Ambulatory Visit
Admission: RE | Admit: 2013-07-10 | Discharge: 2013-07-10 | Disposition: A | Discharge status: Home / Self Care | Payer: Medicaid Other | Source: Ambulatory Visit | Attending: Neurology | Admitting: Neurology

## 2013-07-10 DIAGNOSIS — E7800 Pure hypercholesterolemia, unspecified: Secondary | ICD-10-CM

## 2013-07-10 DIAGNOSIS — F419 Anxiety disorder, unspecified: Secondary | ICD-10-CM

## 2013-07-10 DIAGNOSIS — E119 Type 2 diabetes mellitus without complications: Secondary | ICD-10-CM

## 2013-07-10 DIAGNOSIS — B1920 Unspecified viral hepatitis C without hepatic coma: Secondary | ICD-10-CM

## 2013-07-14 ENCOUNTER — Telehealth: Payer: Self-pay | Admitting: *Deleted

## 2013-07-14 ENCOUNTER — Ambulatory Visit (HOSPITAL_BASED_OUTPATIENT_CLINIC_OR_DEPARTMENT_OTHER): Payer: Medicaid Other | Admitting: Internal Medicine

## 2013-07-14 ENCOUNTER — Other Ambulatory Visit (HOSPITAL_BASED_OUTPATIENT_CLINIC_OR_DEPARTMENT_OTHER): Payer: Medicaid Other

## 2013-07-14 ENCOUNTER — Telehealth: Payer: Self-pay | Admitting: Internal Medicine

## 2013-07-14 ENCOUNTER — Ambulatory Visit: Payer: Medicaid Other

## 2013-07-14 DIAGNOSIS — E119 Type 2 diabetes mellitus without complications: Secondary | ICD-10-CM

## 2013-07-14 DIAGNOSIS — K7460 Unspecified cirrhosis of liver: Secondary | ICD-10-CM

## 2013-07-14 DIAGNOSIS — B182 Chronic viral hepatitis C: Secondary | ICD-10-CM

## 2013-07-14 LAB — CBC WITH DIFFERENTIAL/PLATELET
BASO%: 0.4 % (ref 0.0–2.0)
BASOS ABS: 0 10*3/uL (ref 0.0–0.1)
EOS%: 3.7 % (ref 0.0–7.0)
Eosinophils Absolute: 0.1 10*3/uL (ref 0.0–0.5)
HEMATOCRIT: 37 % — AB (ref 38.4–49.9)
HEMOGLOBIN: 12.5 g/dL — AB (ref 13.0–17.1)
LYMPH#: 1 10*3/uL (ref 0.9–3.3)
LYMPH%: 31.5 % (ref 14.0–49.0)
MCH: 34.1 pg — AB (ref 27.2–33.4)
MCHC: 33.8 g/dL (ref 32.0–36.0)
MCV: 100.9 fL — ABNORMAL HIGH (ref 79.3–98.0)
MONO#: 0.4 10*3/uL (ref 0.1–0.9)
MONO%: 11.8 % (ref 0.0–14.0)
NEUT%: 52.6 % (ref 39.0–75.0)
NEUTROS ABS: 1.6 10*3/uL (ref 1.5–6.5)
Platelets: 53 10*3/uL — ABNORMAL LOW (ref 140–400)
RBC: 3.67 10*6/uL — ABNORMAL LOW (ref 4.20–5.82)
RDW: 14.6 % (ref 11.0–14.6)
WBC: 3 10*3/uL — ABNORMAL LOW (ref 4.0–10.3)

## 2013-07-14 LAB — IRON AND TIBC CHCC
%SAT: 23 % (ref 20–55)
Iron: 76 ug/dL (ref 42–163)
TIBC: 334 ug/dL (ref 202–409)
UIBC: 258 ug/dL (ref 117–376)

## 2013-07-14 LAB — FERRITIN CHCC: Ferritin: 196 ng/ml (ref 22–316)

## 2013-07-14 NOTE — Telephone Encounter (Signed)
Per staff message and POF I have scheduled appts.  JMW  

## 2013-07-14 NOTE — Telephone Encounter (Signed)
Gave pt appt for infusion tomorrow also advised pt to get appt calendar for the rest of February 2015

## 2013-07-14 NOTE — Telephone Encounter (Signed)
Gave pt appt for lab and MD on MArch 2015 pt will have phlebotomy for tomorrow and and next 2 weeks

## 2013-07-14 NOTE — Progress Notes (Signed)
[facility] OFFICE PROGRESS NOTE  Alva GarnetSHELTON,KIMBERLY R., MD [address] [place] [id number]  DIAGNOSIS: Other hemochromatosis - Plan: CBC with Differential, Comprehensive metabolic panel (Cmet) - [facility], Ferritin, CBC with Differential, Ferritin  Chief Complaint  Patient presents with  . Other hemochromatosis    CURRENT THERAPY: Phlebotomy to maintain a ferritin less than 50 and hold prlebotomy if hemoglobin is less than 11.   INTERVAL HISTORY: John [name] 58 y.o. male with a history of  Hereditary hemochromatosis is here for follow-up. He was initially seen by us on 06/30/2013.   Of note patient also has a history of hepatitis C and DM2 on insulin. He was managed by Dr. Gardiner CoinsJohn Owen from 2008 till presently. He desires to follow locally for his hematology requiring bi-weekly therapeutic phlebotomy. He noted phlebotomy times would be in excess five hours and [place] [facility] [place] Most of his care including his endocrinology and neurology [facility]   He reports that he obtains therapeutic phlebotomy for hereditary hemochromatosis but notes elevated ferritin. [facility] also [facility]. He has a history of essential tremors and [facility]. The hemachromatosis was diagnosed 7 years ago. He also has diabetes managed by endocrinology. He denies any recent hypoglycemic episodes. He notes hyperglycemia recently. His diabetes was diagnosed in 2007.   Today, he reports a limp due to his right leg hurting. He has a right hip x-ray planned. In addition, his MR of brain is scheduled for 20th of April. He reports an EGD done 2 years ago with varices reported. He has been on nadolol. His hepatitis C is managed by Dr. Dahlia ClientBrussan with biannual ultrasound. His HBA1C was 8.4. He reports taking lactulose as needed. He has not needed it in about one year. He was diagnosed with the above when he moved from [place] in  2007. He is adopted and does not know his family history. He lives with his significant other of 13 years. He has four children. His last hospitalization was during Christmas 2013 for pneumonia. He reports that his youngest child is ill.     MEDICAL HISTORY: Past Medical History  Diagnosis Date  . Diabetes   . High cholesterol   . Anxiety   . Hepatitis C     INTERIM HISTORY: has Diabetes; High cholesterol; Anxiety; Hepatitis C; Other hemochromatosis; and Tremors of nervous system on his problem list.    ALLERGIES:  is allergic to sulfa antibiotics and testosterone.  MEDICATIONS: has a current medication list which includes the following prescription(s): gabapentin, glucagon, insulin aspart, insulin glargine, and nadolol.  SURGICAL HISTORY:  Past Surgical History  Procedure Laterality Date  . None      REVIEW OF SYSTEMS:   Constitutional: Denies fevers, chills or abnormal weight loss Eyes: Denies blurriness of vision Ears, nose, mouth, throat, and face: Denies mucositis or sore throat Respiratory: Denies cough, dyspnea or wheezes Cardiovascular: Denies palpitation, chest discomfort or lower extremity swelling Gastrointestinal:  Denies nausea, heartburn or change in bowel habits Skin: Denies abnormal skin rashes Lymphatics: Denies new lymphadenopathy or easy bruising Neurological:Denies numbness, tingling or new weaknesses Behavioral/Psych: Mood is stable, no new changes  All other systems were reviewed with the patient and are negative.  PHYSICAL EXAMINATION: ECOG PERFORMANCE STATUS: 0 - Asymptomatic  Blood pressure 98/66, pulse 98, temperature 97.7 F (36.5 C), temperature source Oral, resp. rate 18, height 6' (1.829 m), weight 167 lb 4.8 oz (75.887 kg),  SpO2 98.00%.  GENERAL:alert, no distress and comfortable SKIN: skin color, texture, turgor are normal, no rashes or significant lesions EYES: normal, Conjunctiva are pink and non-injected, sclera clear OROPHARYNX:no  exudate, no erythema and lips, buccal mucosa, and tongue normal  NECK: supple, thyroid normal size, non-tender, without nodularity LYMPH:  no palpable lymphadenopathy in the cervical, axillary or supraclavicular LUNGS: clear to auscultation and percussion with normal breathing effort HEART: regular rate & rhythm and no murmurs and no lower extremity edema ABDOMEN:abdomen soft, non-tender and normal bowel sounds; enlarged spleen about 2 cm below costaphrenic angle. No additional stigmata of liver disease.  Musculoskeletal:no cyanosis of digits and no clubbing  NEURO: alert & oriented x 3 with fluent speech, no focal motor/sensory deficits  LABORATORY DATA: Results for orders placed in visit on 07/14/13 (from the past 48 hour(s))  CBC WITH DIFFERENTIAL     Status: Abnormal   Collection Time    07/14/13  8:41 AM      Result Value Range   WBC 3.0 (*) 4.0 - 10.3 10e3/uL   NEUT# 1.6  1.5 - 6.5 10e3/uL   HGB 12.5 (*) 13.0 - 17.1 g/dL   HCT 09.8 (*) 11.9 - 14.7 %   Platelets 53 Platelet count confirmed by slide estimate (*) 140 - 400 10e3/uL   MCV 100.9 (*) 79.3 - 98.0 fL   MCH 34.1 (*) 27.2 - 33.4 pg   MCHC 33.8  32.0 - 36.0 g/dL   RBC 8.29 (*) 5.62 - 1.30 10e6/uL   RDW 14.6  11.0 - 14.6 %   lymph# 1.0  0.9 - 3.3 10e3/uL   MONO# 0.4  0.1 - 0.9 10e3/uL   Eosinophils Absolute 0.1  0.0 - 0.5 10e3/uL   Basophils Absolute 0.0  0.0 - 0.1 10e3/uL   NEUT% 52.6  39.0 - 75.0 %   LYMPH% 31.5  14.0 - 49.0 %   MONO% 11.8  0.0 - 14.0 %   EOS% 3.7  0.0 - 7.0 %   BASO% 0.4  0.0 - 2.0 %       Labs:  Lab Results  Component Value Date   WBC 3.0* 07/14/2013   HGB 12.5* 07/14/2013   HCT 37.0* 07/14/2013   MCV 100.9* 07/14/2013   PLT 53 Platelet count confirmed by slide estimate* 07/14/2013   NEUTROABS 1.6 07/14/2013      Chemistry      Component Value Date/Time   NA 133* 06/30/2013 1033   NA 138 04/13/2010 1417   K 4.5 06/30/2013 1033   K 3.2* 04/13/2010 1417   CL 106 04/13/2010 1417   CO2 29 06/30/2013  1033   CO2 26 04/13/2010 1417   BUN 11.3 06/30/2013 1033   BUN 6 04/13/2010 1417   CREATININE 0.9 06/30/2013 1033   CREATININE 0.77 04/13/2010 1417      Component Value Date/Time   CALCIUM 8.7 06/30/2013 1033   CALCIUM 8.6 04/13/2010 1417   ALKPHOS 171* 06/30/2013 1033   ALKPHOS 120* 04/13/2010 1417   AST 137* 06/30/2013 1033   AST 206* 04/13/2010 1417   ALT 103* 06/30/2013 1033   ALT 177* 04/13/2010 1417   BILITOT 0.74 06/30/2013 1033   BILITOT 0.9 04/13/2010 1417     CBC:  Recent Labs Lab 07/14/13 0841  WBC 3.0*  NEUTROABS 1.6  HGB 12.5*  HCT 37.0*  MCV 100.9*  PLT 53 Platelet count confirmed by slide estimate*   Iron/TIBC/Ferritin    Component Value Date/Time   IRON 196* 06/30/2013 1033  TIBC 324 06/30/2013 1033   FERRITIN 379* 06/30/2013 1033   Studies:  No results found.   RADIOGRAPHIC STUDIES: Dg Hip Complete Right  07/10/2013   CLINICAL DATA:  Right groin pain for approximately 1 year. No known injury.  EXAM: RIGHT HIP - COMPLETE 2+ VIEW  COMPARISON:  None.  FINDINGS: The mineralization and alignment are normal. There is no evidence of acute fracture or dislocation. There is no evidence of femoral head avascular necrosis. No significant arthropathic changes are identified.  IMPRESSION: Negative right hip radiographs.   Electronically Signed   By: Roxy Horseman M.D.   On: 07/10/2013 10:39    ASSESSMENT: Sher Hellinger 58 y.o. male with a history of Other hemochromatosis - Plan: CBC with Differential, Comprehensive metabolic panel (Cmet) - [facility], Ferritin, CBC with Differential, Ferritin   PLAN:  1. Hereditary hemochromatosis.  --We will request his hematology records from Dr. Alvera Singh office. We discuss maintaining his ferritin less than 50. We will facilitate therapeutic phlebotomy in the next day or so. Patient reports that his needs normal saline with his phlebotomy. We will continue phlebotomy biweekly as tolerated. We hope to maintain a hemoglobin greater than 11 (20%  of his initial hemoglobin baseline of 13.7) and hold phlebotomy if it is much lower than that.   He was counseled on symptoms of ortho stasis.     2. DM2 secondary #1.  --Continue insulin sliding scale and follow-up with [facility].   3. 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  --Per his report, he has a follow up with GI to discuss treatment. We will follow closely.  --Continue nadolol. He was counseled to report any bleeding.   4. Follow-up.  --Labs including CBC,ferritin biweekly with phlebotomy if ferritin greater than 50 and hemoglobin greater than 11; Labs including CBC, CMP in 4 weeks upon return visit.   All questions were answered. The patient knows to call the clinic with any problems, questions or concerns. We can certainly see the patient much sooner if necessary.  I spent 15 minutes counseling the patient face to face. The total time spent in the appointment was 25 minutes.    Latosha Gaylord, MD 07/14/2013 9:45 AM

## 2013-07-15 ENCOUNTER — Ambulatory Visit (HOSPITAL_BASED_OUTPATIENT_CLINIC_OR_DEPARTMENT_OTHER): Payer: Medicaid Other

## 2013-07-15 ENCOUNTER — Other Ambulatory Visit: Payer: Self-pay | Admitting: Internal Medicine

## 2013-07-15 NOTE — Progress Notes (Signed)
1 unit of blood removed for therapeutic phlebotomy per orders via L AC. Patient tolerated well, procedure lasted from 1428-1435. Food and drink provided and patient monitored per policy.

## 2013-07-15 NOTE — Patient Instructions (Signed)
Therapeutic Phlebotomy Therapeutic phlebotomy is the controlled removal of blood from your body for the purpose of treating a medical condition. It is similar to donating blood. Usually, about a pint (470 mL) of blood is removed. The average adult has 9 to 12 pints (4.3 to 5.7 L) of blood. Therapeutic phlebotomy may be used to treat the following medical conditions:  Hemochromatosis. This is a condition in which there is too much iron in the blood.  Polycythemia vera. This is a condition in which there are too many red cells in the blood.  Porphyria cutanea tarda. This is a disease usually passed from one generation to the next (inherited). It is a condition in which an important part of hemoglobin is not made properly. This results in the build up of abnormal amounts of porphyrins in the body.  Sickle cell disease. This is an inherited disease. It is a condition in which the red blood cells form an abnormal crescent shape rather than a round shape. LET YOUR CAREGIVER KNOW ABOUT:  Allergies.  Medicines taken including herbs, eyedrops, over-the-counter medicines, and creams.  Use of steroids (by mouth or creams).  Previous problems with anesthetics or numbing medicine.  History of blood clots.  History of bleeding or blood problems.  Previous surgery.  Possibility of pregnancy, if this applies. RISKS AND COMPLICATIONS This is a simple and safe procedure. Problems are unlikely. However, problems can occur and may include:  Nausea or lightheadedness.  Low blood pressure.  Soreness, bleeding, swelling, or bruising at the needle insertion site.  Infection. BEFORE THE PROCEDURE  This is a procedure that can be done as an outpatient. Confirm the time that you need to arrive for your procedure. Confirm whether there is a need to fast or withhold any medications. It is helpful to wear clothing with sleeves that can be raised above the elbow. A blood sample may be done to determine the  amount of red blood cells or iron in your blood. Plan ahead of time to have someone drive you home after the procedure. PROCEDURE The entire procedure from preparation through recovery takes about 1 hour. The actual collection takes about 10 to 15 minutes.  A needle will be inserted into your vein.  Tubing and a collection bag will be attached to that needle.  Blood will flow through the needle and tubing into the collection bag.  You may be asked to open and close your hand slowly and continuously during the entire collection.  Once the specified amount of blood has been removed from your body, the collection bag and tubing will be clamped.  The needle will be removed.  Pressure will be held on the site of the needle insertion to stop the bleeding. Then a bandage will be placed over the needle insertion site. AFTER THE PROCEDURE  Your recovery will be assessed and monitored. If there are no problems, as an outpatient, you should be able to go home shortly after the procedure.  Document Released: 10/30/2010 Document Revised: 08/20/2011 Document Reviewed: 10/30/2010 [employer] Patient Information 2014 [employer].  

## 2013-08-12 ENCOUNTER — Telehealth: Payer: Self-pay | Admitting: *Deleted

## 2013-08-12 ENCOUNTER — Ambulatory Visit (HOSPITAL_BASED_OUTPATIENT_CLINIC_OR_DEPARTMENT_OTHER): Payer: Medicaid Other | Admitting: Internal Medicine

## 2013-08-12 ENCOUNTER — Telehealth: Payer: Self-pay | Admitting: Internal Medicine

## 2013-08-12 ENCOUNTER — Other Ambulatory Visit (HOSPITAL_BASED_OUTPATIENT_CLINIC_OR_DEPARTMENT_OTHER): Payer: Medicaid Other

## 2013-08-12 ENCOUNTER — Ambulatory Visit (HOSPITAL_BASED_OUTPATIENT_CLINIC_OR_DEPARTMENT_OTHER): Payer: Medicaid Other

## 2013-08-12 ENCOUNTER — Encounter: Payer: Self-pay | Admitting: Internal Medicine

## 2013-08-12 LAB — COMPREHENSIVE METABOLIC PANEL (CC13)
ALT: 135 U/L — ABNORMAL HIGH (ref 0–55)
ANION GAP: 6 meq/L (ref 3–11)
AST: 178 U/L — AB (ref 5–34)
Albumin: 2.2 g/dL — ABNORMAL LOW (ref 3.5–5.0)
Alkaline Phosphatase: 153 U/L — ABNORMAL HIGH (ref 40–150)
BUN: 11.8 mg/dL (ref 7.0–26.0)
CHLORIDE: 103 meq/L (ref 98–109)
CO2: 29 meq/L (ref 22–29)
Calcium: 8.5 mg/dL (ref 8.4–10.4)
Creatinine: 0.9 mg/dL (ref 0.7–1.3)
Glucose: 229 mg/dl — ABNORMAL HIGH (ref 70–140)
POTASSIUM: 3.9 meq/L (ref 3.5–5.1)
Sodium: 138 mEq/L (ref 136–145)
TOTAL PROTEIN: 7.1 g/dL (ref 6.4–8.3)
Total Bilirubin: 0.59 mg/dL (ref 0.20–1.20)

## 2013-08-12 LAB — CBC WITH DIFFERENTIAL/PLATELET
BASO%: 0.5 % (ref 0.0–2.0)
Basophils Absolute: 0 10*3/uL (ref 0.0–0.1)
EOS%: 3.4 % (ref 0.0–7.0)
Eosinophils Absolute: 0.1 10*3/uL (ref 0.0–0.5)
HEMATOCRIT: 39.3 % (ref 38.4–49.9)
HEMOGLOBIN: 13.2 g/dL (ref 13.0–17.1)
LYMPH%: 25.6 % (ref 14.0–49.0)
MCH: 32.3 pg (ref 27.2–33.4)
MCHC: 33.6 g/dL (ref 32.0–36.0)
MCV: 96.2 fL (ref 79.3–98.0)
MONO#: 0.4 10*3/uL (ref 0.1–0.9)
MONO%: 8.9 % (ref 0.0–14.0)
NEUT#: 2.5 10*3/uL (ref 1.5–6.5)
NEUT%: 61.6 % (ref 39.0–75.0)
Platelets: 66 10*3/uL — ABNORMAL LOW (ref 140–400)
RBC: 4.08 10*6/uL — ABNORMAL LOW (ref 4.20–5.82)
RDW: 13.8 % (ref 11.0–14.6)
WBC: 4.1 10*3/uL (ref 4.0–10.3)
lymph#: 1.1 10*3/uL (ref 0.9–3.3)

## 2013-08-12 LAB — FERRITIN CHCC: Ferritin: 144 ng/ml (ref 22–316)

## 2013-08-12 NOTE — Telephone Encounter (Signed)
Per staff message and POF I have scheduled appts.  JMW  

## 2013-08-12 NOTE — Patient Instructions (Signed)
Therapeutic Phlebotomy Therapeutic phlebotomy is the controlled removal of blood from your body for the purpose of treating a medical condition. It is similar to donating blood. Usually, about a pint (470 mL) of blood is removed. The average adult has 9 to 12 pints (4.3 to 5.7 L) of blood. Therapeutic phlebotomy may be used to treat the following medical conditions:  Hemochromatosis. This is a condition in which there is too much iron in the blood.  Polycythemia vera. This is a condition in which there are too many red cells in the blood.  Porphyria cutanea tarda. This is a disease usually passed from one generation to the next (inherited). It is a condition in which an important part of hemoglobin is not made properly. This results in the build up of abnormal amounts of porphyrins in the body.  Sickle cell disease. This is an inherited disease. It is a condition in which the red blood cells form an abnormal crescent shape rather than a round shape. LET YOUR CAREGIVER KNOW ABOUT:  Allergies.  Medicines taken including herbs, eyedrops, over-the-counter medicines, and creams.  Use of steroids (by mouth or creams).  Previous problems with anesthetics or numbing medicine.  History of blood clots.  History of bleeding or blood problems.  Previous surgery.  Possibility of pregnancy, if this applies. RISKS AND COMPLICATIONS This is a simple and safe procedure. Problems are unlikely. However, problems can occur and may include:  Nausea or lightheadedness.  Low blood pressure.  Soreness, bleeding, swelling, or bruising at the needle insertion site.  Infection. BEFORE THE PROCEDURE  This is a procedure that can be done as an outpatient. Confirm the time that you need to arrive for your procedure. Confirm whether there is a need to fast or withhold any medications. It is helpful to wear clothing with sleeves that can be raised above the elbow. A blood sample may be done to determine the  amount of red blood cells or iron in your blood. Plan ahead of time to have someone drive you home after the procedure. PROCEDURE The entire procedure from preparation through recovery takes about 1 hour. The actual collection takes about 10 to 15 minutes.  A needle will be inserted into your vein.  Tubing and a collection bag will be attached to that needle.  Blood will flow through the needle and tubing into the collection bag.  You may be asked to open and close your hand slowly and continuously during the entire collection.  Once the specified amount of blood has been removed from your body, the collection bag and tubing will be clamped.  The needle will be removed.  Pressure will be held on the site of the needle insertion to stop the bleeding. Then a bandage will be placed over the needle insertion site. AFTER THE PROCEDURE  Your recovery will be assessed and monitored. If there are no problems, as an outpatient, you should be able to go home shortly after the procedure.  Document Released: 10/30/2010 Document Revised: 08/20/2011 Document Reviewed: 10/30/2010 [employer] Patient Information 2014 [employer].  

## 2013-08-12 NOTE — Progress Notes (Signed)
One unit phlebotomy performed using a 16 gauge phlebotomy set in the left antecubital. Patient tolerated well. Nourishment provided.

## 2013-08-12 NOTE — Telephone Encounter (Signed)
pt will get appt calendar for [facility]Arch with [facility] chemo room, he was called to chemo

## 2013-08-12 NOTE — Progress Notes (Signed)
[facility] OFFICE PROGRESS NOTE  Alva GarnetSHELTON,KIMBERLY R., MD [address] [place] [id number]  DIAGNOSIS: Other hemochromatosis - Plan: CBC with Differential, Comprehensive metabolic panel (Cmet) - [facility]  Chief Complaint  Patient presents with  . Other hemochromatosis    CURRENT THERAPY: Phlebotomy to maintain a ferritin less than 50 and hold phlebotomy if hemoglobin is less than 11.   INTERVAL HISTORY: John Chylelvin Gnau 58 y.o. male with a history of  Hereditary hemochromatosis is here for follow-up. He was last seen by us on 07/14/2013.   Of note patient also has a history of hepatitis C and DM2 on insulin. He was managed by Dr. Gardiner CoinsJohn Owen from 2008 till presently. He desires to follow locally for his hematology requiring bi-weekly therapeutic phlebotomy. He noted phlebotomy times would be in excess five hours and [place]. Most of his care including his endocrinology and neurology [facility].   He reports that he obtains therapeutic phlebotomy for hereditary hemochromatosis but notes elevated ferritin. [facility] also [facility]. He has a history of essential tremors and [facility]. The hemachromatosis was diagnosed 7 years ago. He also has diabetes managed by endocrinology. He denies any recent hypoglycemic episodes. He notes hyperglycemia recently. His diabetes was diagnosed in 2007.   He reports a limp due to his right leg hurting. He has a right hip x-ray planned. In addition, his MR of brain is scheduled for 20th of April. He reports an EGD done 2 years ago with varices reported. He has been on nadolol. His hepatitis C is managed by Dr. Dahlia ClientBrussan with biannual ultrasound. His HBA1C was 8.4. He reports taking lactulose as needed. He has not needed it in about one year. He was diagnosed with the above when he moved from [place] in 2007. He is adopted and does not know his family history.  He lives with his significant other of 13 years. He has four children. His last hospitalization was during Christmas 2013 for pneumonia.   Today, he is accompanied by his significant other.  He reports increased fatigue but denies falls.  He has day time somnolence and takes naps doing the day.  He snores occasionally.  He significant other denies evidence of apnea episodes.     MEDICAL HISTORY: Past Medical History  Diagnosis Date  . Diabetes   . High cholesterol   . Anxiety   . Hepatitis C     INTERIM HISTORY: has Diabetes; High cholesterol; Anxiety; Hepatitis C; Other hemochromatosis; and Tremors of nervous system on his problem list.    ALLERGIES:  is allergic to sulfa antibiotics and testosterone.  MEDICATIONS: has a current medication list which includes the following prescription(s): gabapentin, glucagon, insulin aspart, insulin glargine, and nadolol.  SURGICAL HISTORY:  Past Surgical History  Procedure Laterality Date  . None      REVIEW OF SYSTEMS:   Constitutional: Denies fevers, chills or abnormal weight loss Eyes: Denies blurriness of vision Ears, nose, mouth, throat, and face: Denies mucositis or sore throat Respiratory: Denies cough, dyspnea or wheezes Cardiovascular: Denies palpitation, chest discomfort or lower extremity swelling Gastrointestinal:  Denies nausea, heartburn or change in bowel habits Skin: Denies abnormal skin rashes Lymphatics: Denies new lymphadenopathy or easy bruising Neurological:Denies numbness, tingling or new weaknesses Behavioral/Psych: Mood is stable, no new changes  All other systems were reviewed with the patient and are negative.  PHYSICAL EXAMINATION: ECOG PERFORMANCE STATUS: 0 -  Asymptomatic  Blood pressure 91/55, pulse 68, temperature 97.1 F (36.2 C), temperature source Oral, resp. rate 18, height 6' (1.829 m), weight 162 lb 3.2 oz (73.573 kg).  GENERAL:alert, no distress and comfortable SKIN: skin color, texture, turgor  are normal, no rashes or significant lesions EYES: normal, Conjunctiva are pink and non-injected, sclera clear OROPHARYNX:no exudate, no erythema and lips, buccal mucosa, and tongue normal  NECK: supple, thyroid normal size, non-tender, without nodularity LYMPH:  no palpable lymphadenopathy in the cervical, axillary or supraclavicular LUNGS: clear to auscultation and percussion with normal breathing effort HEART: regular rate & rhythm and no murmurs and no lower extremity edema ABDOMEN:abdomen soft, non-tender and normal bowel sounds; enlarged spleen about 2 cm below costaphrenic angle. No additional stigmata of liver disease.  Musculoskeletal:no cyanosis of digits and no clubbing  NEURO: alert & oriented x 3 with fluent speech, no focal motor/sensory deficits  LABORATORY DATA: Results for orders placed in visit on 08/12/13 (from the past 48 hour(s))  CBC WITH DIFFERENTIAL     Status: Abnormal   Collection Time    08/12/13  8:32 AM      Result Value Ref Range   WBC 4.1  4.0 - 10.3 10e3/uL   NEUT# 2.5  1.5 - 6.5 10e3/uL   HGB 13.2  13.0 - 17.1 g/dL   HCT 16.1  09.6 - 04.5 %   Platelets 66 (*) 140 - 400 10e3/uL   MCV 96.2  79.3 - 98.0 fL   MCH 32.3  27.2 - 33.4 pg   MCHC 33.6  32.0 - 36.0 g/dL   RBC 4.09 (*) 8.11 - 9.14 10e6/uL   RDW 13.8  11.0 - 14.6 %   lymph# 1.1  0.9 - 3.3 10e3/uL   MONO# 0.4  0.1 - 0.9 10e3/uL   Eosinophils Absolute 0.1  0.0 - 0.5 10e3/uL   Basophils Absolute 0.0  0.0 - 0.1 10e3/uL   NEUT% 61.6  39.0 - 75.0 %   LYMPH% 25.6  14.0 - 49.0 %   MONO% 8.9  0.0 - 14.0 %   EOS% 3.4  0.0 - 7.0 %   BASO% 0.5  0.0 - 2.0 %  FERRITIN [facility]     Status: None   Collection Time    08/12/13  8:32 AM      Result Value Ref Range   Ferritin 144  22 - 316 ng/ml  COMPREHENSIVE METABOLIC PANEL (CC13)     Status: Abnormal   Collection Time    08/12/13  8:32 AM      Result Value Ref Range   Sodium 138  136 - 145 mEq/L   Potassium 3.9  3.5 - 5.1 mEq/L   Chloride 103  98 - 109  mEq/L   CO2 29  22 - 29 mEq/L   Glucose 229 (*) 70 - 140 mg/dl   BUN 78.2  7.0 - 95.6 mg/dL   Creatinine 0.9  0.7 - 1.3 mg/dL   Total Bilirubin 2.13  0.20 - 1.20 mg/dL   Alkaline Phosphatase 153 (*) 40 - 150 U/L   AST 178 (*) 5 - 34 U/L   ALT 135 (*) 0 - 55 U/L   Total Protein 7.1  6.4 - 8.3 g/dL   Albumin 2.2 (*) 3.5 - 5.0 g/dL   Calcium 8.5  8.4 - 08.6 mg/dL   Anion Gap 6  3 - 11 mEq/L       Labs:  Lab Results  Component Value Date   WBC 4.1  08/12/2013   HGB 13.2 08/12/2013   HCT 39.3 08/12/2013   MCV 96.2 08/12/2013   PLT 66* 08/12/2013   NEUTROABS 2.5 08/12/2013      Chemistry      Component Value Date/Time   NA 138 08/12/2013 0832   NA 138 04/13/2010 1417   K 3.9 08/12/2013 0832   K 3.2* 04/13/2010 1417   CL 106 04/13/2010 1417   CO2 29 08/12/2013 0832   CO2 26 04/13/2010 1417   BUN 11.8 08/12/2013 0832   BUN 6 04/13/2010 1417   CREATININE 0.9 08/12/2013 0832   CREATININE 0.77 04/13/2010 1417      Component Value Date/Time   CALCIUM 8.5 08/12/2013 0832   CALCIUM 8.6 04/13/2010 1417   ALKPHOS 153* 08/12/2013 0832   ALKPHOS 120* 04/13/2010 1417   AST 178* 08/12/2013 0832   AST 206* 04/13/2010 1417   ALT 135* 08/12/2013 0832   ALT 177* 04/13/2010 1417   BILITOT 0.59 08/12/2013 0832   BILITOT 0.9 04/13/2010 1417     CBC:  Recent Labs Lab 08/12/13 0832  WBC 4.1  NEUTROABS 2.5  HGB 13.2  HCT 39.3  MCV 96.2  PLT 66*   Iron/TIBC/Ferritin    Component Value Date/Time   IRON 76 07/14/2013 0841   TIBC 334 07/14/2013 0841   FERRITIN 144 08/12/2013 0832   Studies:  No results found.   RADIOGRAPHIC STUDIES: Dg Hip Complete Right  07/10/2013   CLINICAL DATA:  Right groin pain for approximately 1 year. No known injury.  EXAM: RIGHT HIP - COMPLETE 2+ VIEW  COMPARISON:  None.  FINDINGS: The mineralization and alignment are normal. There is no evidence of acute fracture or dislocation. There is no evidence of femoral head avascular necrosis. No significant arthropathic changes are identified.   IMPRESSION: Negative right hip radiographs.   Electronically Signed   By: Roxy Horseman M.D.   On: 07/10/2013 10:39    ASSESSMENT: John [name] 58 y.o. male with a history of Other hemochromatosis - Plan: CBC with Differential, Comprehensive metabolic panel (Cmet) - [facility]   PLAN:  1. Hereditary hemochromatosis.  --We discuss maintaining his ferritin less than 50. We will facilitate therapeutic phlebotomy today. Patient reports that his needs normal saline with his phlebotomy. We will continue phlebotomy biweekly as tolerated. We hope to maintain a hemoglobin greater than 11 (20% of his initial hemoglobin baseline of 13.7) and hold phlebotomy if it is much lower than that.   He was counseled on symptoms of ortho stasis.     2. DM2 secondary #1.  --Continue insulin sliding scale and follow-up with [facility].   3. 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  --Per his report, he has a follow up with GI to discuss treatment. We will follow closely.  --Continue nadolol. He was counseled to report any bleeding.   4. Daytime somnolence. --We discussed continued good sleep hygiene.  If this persists, he may require a sleep study.   5. Follow-up.  --Labs including CBC,ferritin biweekly with phlebotomy if ferritin greater than 50 and hemoglobin greater than 11; Labs including CBC, CMP in 4 weeks upon return visit.   All questions were answered. The patient knows to call the clinic with any problems, questions or concerns. We can certainly see the patient much sooner if necessary.  I spent 15 minutes counseling the patient face to face. The total time spent in the appointment was 25 minutes.    Brighton Pilley,  MD 08/14/2013 5:46 AM

## 2013-08-14 ENCOUNTER — Telehealth: Payer: Self-pay | Admitting: Internal Medicine

## 2013-08-14 NOTE — Telephone Encounter (Signed)
Called pt and left message regarding next week advised pt to get appt calendar for lab,md and phlebotomy

## 2013-08-26 ENCOUNTER — Telehealth: Payer: Self-pay | Admitting: *Deleted

## 2013-08-26 ENCOUNTER — Ambulatory Visit (HOSPITAL_BASED_OUTPATIENT_CLINIC_OR_DEPARTMENT_OTHER): Payer: Medicaid Other

## 2013-08-26 ENCOUNTER — Other Ambulatory Visit (HOSPITAL_BASED_OUTPATIENT_CLINIC_OR_DEPARTMENT_OTHER): Payer: Medicaid Other

## 2013-08-26 LAB — CBC WITH DIFFERENTIAL/PLATELET
BASO%: 0.4 % (ref 0.0–2.0)
Basophils Absolute: 0 10*3/uL (ref 0.0–0.1)
EOS%: 3.6 % (ref 0.0–7.0)
Eosinophils Absolute: 0.1 10*3/uL (ref 0.0–0.5)
HCT: 38.8 % (ref 38.4–49.9)
HGB: 12.9 g/dL — ABNORMAL LOW (ref 13.0–17.1)
LYMPH%: 35.9 % (ref 14.0–49.0)
MCH: 31.6 pg (ref 27.2–33.4)
MCHC: 33.3 g/dL (ref 32.0–36.0)
MCV: 94.9 fL (ref 79.3–98.0)
MONO#: 0.4 10*3/uL (ref 0.1–0.9)
MONO%: 12.1 % (ref 0.0–14.0)
NEUT#: 1.6 10*3/uL (ref 1.5–6.5)
NEUT%: 48 % (ref 39.0–75.0)
PLATELETS: 75 10*3/uL — AB (ref 140–400)
RBC: 4.09 10*6/uL — AB (ref 4.20–5.82)
RDW: 14.6 % (ref 11.0–14.6)
WBC: 3.2 10*3/uL — ABNORMAL LOW (ref 4.0–10.3)
lymph#: 1.2 10*3/uL (ref 0.9–3.3)

## 2013-08-26 LAB — FERRITIN CHCC: FERRITIN: 81 ng/mL (ref 22–316)

## 2013-08-26 NOTE — Patient Instructions (Signed)

## 2013-08-26 NOTE — Telephone Encounter (Signed)
Nourishments offered to [name] while here today for phlebotomy.  He did not eat breakfast and did not want to eat.  He did eat peanut butter, cheese on crackers with a diet soda.  Reports he has been unable to see dietician for diabetes.  [facility] dietician does not evaluate solely for diabetes.  Would like a referral and needs something to help with increasing appetite.  Mentions living in [place] and needing something close to home.  Will notify providers.

## 2013-08-26 NOTE — Progress Notes (Signed)
Vitals post phlebotomy WNL.

## 2013-08-26 NOTE — Progress Notes (Signed)
Discharged ambulatory in no distress with wife.

## 2013-09-01 ENCOUNTER — Encounter: Payer: Self-pay | Admitting: Internal Medicine

## 2013-09-02 ENCOUNTER — Telehealth: Payer: Self-pay | Admitting: Medical Oncology

## 2013-09-02 NOTE — Telephone Encounter (Signed)
I called pt and left him a message regarding his my chart message. Per Dr. Rosie Fatehism his Iron levels 07/14/13 were normal. His ferritin level is coming down nicely. He could be feeling more tired due the phlebotomy considering he has other medical conditions like his diabetes. I asked him to call back if he has further questions or concerns. He has lab and ? Phlebotomy ( depending on labs) on 09/09/13.

## 2013-09-08 ENCOUNTER — Other Ambulatory Visit: Payer: Self-pay

## 2013-09-09 ENCOUNTER — Ambulatory Visit: Payer: Medicaid Other

## 2013-09-09 ENCOUNTER — Other Ambulatory Visit (HOSPITAL_BASED_OUTPATIENT_CLINIC_OR_DEPARTMENT_OTHER): Payer: Medicaid Other

## 2013-09-09 LAB — FERRITIN CHCC: FERRITIN: 54 ng/mL (ref 22–316)

## 2013-09-09 LAB — CBC WITH DIFFERENTIAL/PLATELET
BASO%: 0.4 % (ref 0.0–2.0)
BASOS ABS: 0 10*3/uL (ref 0.0–0.1)
EOS%: 3.8 % (ref 0.0–7.0)
Eosinophils Absolute: 0.1 10*3/uL (ref 0.0–0.5)
HCT: 37 % — ABNORMAL LOW (ref 38.4–49.9)
HEMOGLOBIN: 12.2 g/dL — AB (ref 13.0–17.1)
LYMPH#: 1 10*3/uL (ref 0.9–3.3)
LYMPH%: 33 % (ref 14.0–49.0)
MCH: 30.8 pg (ref 27.2–33.4)
MCHC: 33 g/dL (ref 32.0–36.0)
MCV: 93.4 fL (ref 79.3–98.0)
MONO#: 0.3 10*3/uL (ref 0.1–0.9)
MONO%: 9.6 % (ref 0.0–14.0)
NEUT%: 53.2 % (ref 39.0–75.0)
NEUTROS ABS: 1.7 10*3/uL (ref 1.5–6.5)
Platelets: 67 10*3/uL — ABNORMAL LOW (ref 140–400)
RBC: 3.96 10*6/uL — AB (ref 4.20–5.82)
RDW: 14.9 % — AB (ref 11.0–14.6)
WBC: 3.1 10*3/uL — ABNORMAL LOW (ref 4.0–10.3)

## 2013-09-09 NOTE — Progress Notes (Signed)
Patient does not feel comfortable having a phlebotomy today as he wants to know his ferritin level first. Patient rescheduled for tomorrow for possible phlebotomy.

## 2013-09-10 ENCOUNTER — Ambulatory Visit: Payer: Medicaid Other

## 2013-09-10 NOTE — Progress Notes (Signed)
Patient came today for a phlebotomy. This RN reviewed labs with patient. Ferritin was 54 and Hemoglobin was 12.2. Patient refused the phlebotomy. He stated,"I would rather wait since, my labs aren't that much over where Dr. Rosie Fatehism wants them to be." I informed patient that if he started feeling like he needed a phlebotomy to please call the office. Patient has appointments for lab/ phlebotomy in two weeks. Patient verbalized understanding.

## 2013-09-21 ENCOUNTER — Ambulatory Visit
Admission: RE | Admit: 2013-09-21 | Discharge: 2013-09-21 | Disposition: A | Discharge status: Home / Self Care | Payer: Medicaid Other | Source: Ambulatory Visit | Attending: Neurology | Admitting: Neurology

## 2013-09-21 DIAGNOSIS — E119 Type 2 diabetes mellitus without complications: Secondary | ICD-10-CM

## 2013-09-21 DIAGNOSIS — B1920 Unspecified viral hepatitis C without hepatic coma: Secondary | ICD-10-CM

## 2013-09-21 DIAGNOSIS — E7800 Pure hypercholesterolemia, unspecified: Secondary | ICD-10-CM

## 2013-09-21 DIAGNOSIS — F419 Anxiety disorder, unspecified: Secondary | ICD-10-CM

## 2013-09-21 DIAGNOSIS — R259 Unspecified abnormal involuntary movements: Secondary | ICD-10-CM

## 2013-09-23 ENCOUNTER — Ambulatory Visit: Payer: Medicaid Other

## 2013-09-23 ENCOUNTER — Other Ambulatory Visit (HOSPITAL_BASED_OUTPATIENT_CLINIC_OR_DEPARTMENT_OTHER): Payer: Medicaid Other

## 2013-09-23 LAB — CBC WITH DIFFERENTIAL/PLATELET
BASO%: 0.4 % (ref 0.0–2.0)
Basophils Absolute: 0 10*3/uL (ref 0.0–0.1)
EOS ABS: 0.2 10*3/uL (ref 0.0–0.5)
EOS%: 3.4 % (ref 0.0–7.0)
HCT: 39 % (ref 38.4–49.9)
HGB: 12.9 g/dL — ABNORMAL LOW (ref 13.0–17.1)
LYMPH#: 1.6 10*3/uL (ref 0.9–3.3)
LYMPH%: 35 % (ref 14.0–49.0)
MCH: 30.3 pg (ref 27.2–33.4)
MCHC: 33.1 g/dL (ref 32.0–36.0)
MCV: 91.3 fL (ref 79.3–98.0)
MONO#: 0.5 10*3/uL (ref 0.1–0.9)
MONO%: 11.4 % (ref 0.0–14.0)
NEUT%: 49.8 % (ref 39.0–75.0)
NEUTROS ABS: 2.3 10*3/uL (ref 1.5–6.5)
PLATELETS: 75 10*3/uL — AB (ref 140–400)
RBC: 4.27 10*6/uL (ref 4.20–5.82)
RDW: 15.5 % — ABNORMAL HIGH (ref 11.0–14.6)
WBC: 4.5 10*3/uL (ref 4.0–10.3)

## 2013-09-23 LAB — FERRITIN CHCC: Ferritin: 43 ng/ml (ref 22–316)

## 2013-09-23 NOTE — Progress Notes (Signed)
Patient did not want to proceed with phlebotomy today since his ferratin level is not back. Patient rescheduled for tomorrow at 8:15am. Patient given copy of today's CBC and agreeable to return tomorrow. Angelena FormKristen Matson Welch, RN

## 2013-09-23 NOTE — Telephone Encounter (Signed)
Lupita LeashDonna: Please Caucasian, MRI of the brain showed age-related changes, no acute lesions, I will explain details at his followup visit.

## 2013-09-24 ENCOUNTER — Ambulatory Visit: Payer: Medicaid Other

## 2013-09-24 NOTE — Progress Notes (Signed)
Ferritin level 43.  Per Dr. Rosie Fatehism no phlebotomy needed.  Pt notified of ferritin level via telephone, and instructed to return in two weeks for repeat levels.  Pt verbalized understanding.

## 2013-09-28 ENCOUNTER — Encounter: Payer: Self-pay | Admitting: Neurology

## 2013-09-28 ENCOUNTER — Ambulatory Visit (INDEPENDENT_AMBULATORY_CARE_PROVIDER_SITE_OTHER): Payer: Medicaid Other | Admitting: Neurology

## 2013-09-28 VITALS — BP 102/61 | HR 74 | Ht 72.0 in | Wt 163.0 lb

## 2013-09-28 DIAGNOSIS — F411 Generalized anxiety disorder: Secondary | ICD-10-CM

## 2013-09-28 DIAGNOSIS — E7800 Pure hypercholesterolemia, unspecified: Secondary | ICD-10-CM

## 2013-09-28 DIAGNOSIS — E119 Type 2 diabetes mellitus without complications: Secondary | ICD-10-CM

## 2013-09-28 DIAGNOSIS — F419 Anxiety disorder, unspecified: Secondary | ICD-10-CM

## 2013-09-28 DIAGNOSIS — F959 Tic disorder, unspecified: Secondary | ICD-10-CM | POA: Insufficient documentation

## 2013-09-28 DIAGNOSIS — B1920 Unspecified viral hepatitis C without hepatic coma: Secondary | ICD-10-CM

## 2013-09-28 MED ORDER — GABAPENTIN 100 MG PO CAPS: 300.0000 mg | ORAL_CAPSULE | Freq: Three times a day (TID) | ORAL | Status: DC

## 2013-09-28 NOTE — Progress Notes (Signed)
[facility]  PATIENT: John [name] DOB: 12/11/1955  HISTORICAL  John [name] is a 58 years old Caucasian male, referred by his primary care physician Dr. Karlton [name] for evaluation of episodes of uncontrollable sudden body movement. 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  Around that time, he also developed episode of uncontrollable body jerking movement, making sound sometimes, it progressively worsened over the years,  During interview, he had multiple episodes of uncontrollable sudden body flexion, making sound, no loss of consciousness,  He was previously diagnosed with motor tics, was given prescription of clonazepam, but he rarely tries it, worry about the side effect, clonazepam would put him into sleep, also worry about the side effect of addiction, liver damage.   [place] 05/2013  showed Hepatic cirrhosis. Multiple echogenic nodules within a heterogenous liver parenchyma. Cholelithiasis with tumefactive sludge. Persistent gallbladder wall thickening is likely related to underlying liver disease.  He was previously evaluated by neurologists at [name] for similar complains, was given prescription of gabapentin, which does help him some   He also complains of right hip pain, difficulty crossing his legs because of hip pain, radiating pain to his right groin area  UPDATE April 20th 2015:  He is with his girl friend [name] since 2002 at today's clinical visit. We have reviewed MRI of the brain which showed mild small vessel disease, no acute lesions, no  significant basal ganglion signal changes  X-ray of right hip was essentially normal,  He was seen by [name] in November 2013, was diagnosed with complex tic disorder, he was getting prescription of clonazepam, which was helping him, but again patient was complains about potential side effect  He has been  followed up closely by GI specialist at [name], most recen visit was in April 14th 2015 by Dr. Deidre [name] on starting [name].   He was diagnosed with hepatitis C  in 2006. He has a liver biopsy then in [place], [place]. He was later seen at [name]. He has never received hepatitis C  treatment in part because  he was concerned about the possible psychiatric side effects of interferon.  He undergoes phlebotomy every 2 weeks since 05/2013; he is seeing an outside hematologist at [name] in [place].  He does not have hepatic encephalopathy or  ascites. He takes lactulose prn for constipation. He is on nadolol for varices, but has had no bleeding events.  He responded very well to [name] 300 mg 3 times a day, but complains of tongue swelling with certain generic form of gabapentin   REVIEW OF SYSTEMS: Full 14 system review of systems performed and notable only for fatigue, excessive thirst, abdominal pain, restless legs, frequent awakening, daytime sleepiness, snoring, achy muscles, muscle cramps, neck stiffness, rash, numbness, weakness, confusion, nervousness   ALLERGIES: Allergies  Allergen Reactions  . Sulfa Antibiotics Swelling    Of tongue  . Sulfamethoxazole Swelling  . Testosterone Rash and Dermatitis    PAST MEDICAL HISTORY: Past Medical History  Diagnosis Date  . Diabetes   . High cholesterol   . Anxiety   .  Hepatitis C   hereditary hemochromatosis   PAST SURGICAL HISTORY: Past Surgical History  Procedure Laterality Date  . None      FAMILY HISTORY: Adopted  SOCIAL HISTORY:  History   Social  History  . Marital Status: Single    Spouse Name: N/A    Number of Children: 4  . Years of Education: 39   Occupational History    Disabled in 2007   Social History Main Topics  . Smoking status: Never Smoker   . Smokeless tobacco: Never Used  . Alcohol Use: No     Comment: Quit 2007  . Drug Use: No     Comment: Quit 1980  . Sexual Activity: Not on file   Other Topics Concern  . Not on file   Social History Narrative   Patient lives at home with his girlfriend.    Patient is disabled.   Right handed.   Caffeine- 1-2 cups of caffeine daily.   Education- two years of college.          PHYSICAL EXAM   Filed Vitals:   09/28/13 1117  BP: 102/61  Pulse: 74  Height: 6' (1.829 m)  Weight: 163 lb (73.936 kg)     Body mass index is 22.1 kg/(m^2).   Generalized: In no acute distress  Neck: Supple, no carotid bruits   Cardiac: Regular rate rhythm  Pulmonary: Clear to auscultation bilaterally  Musculoskeletal: No deformity  Neurological examination  Mentation: unkempt, Alert oriented to time, place, history taking, and causual conversation  Cranial nerve II-XII: Pupils were equal round reactive to light extraocular movements were full, Visual field were full on confrontational test. Bilateral fundi were sharp.  Facial sensation and strength were normal. Hearing was intact to finger rubbing bilaterally. Uvula tongue midline.  head turning and shoulder shrug and were normal and symmetric.Tongue protrusion into cheek strength was normal.  Motor: normal tone, bulk and strength, he complains of bilateral adductor longus muscle tenderness upon deep palpation, after he holds his legs in an external rotated position.  Sensory: Intact to fine touch, pinprick, preserved vibratory sensation, and proprioception at toes.  Coordination: Normal finger to nose, heel-to-shin bilaterally there was no truncal ataxia  Gait: Rising up from seated position without assistance  narrow-base, mild atalgic due to right hip pain  Romberg signs: Negative  Deep tendon reflexes: Brachioradialis 2/2, biceps 2/2, triceps 2/2, patellar 2/2, Achilles 2/2, plantar responses were flexor bilaterally.   DIAGNOSTIC DATA (LABS, IMAGING, TESTING) - I reviewed patient records, labs, notes, testing and imaging myself where available.  Lab Results  Component Value Date   WBC 4.5 09/23/2013   HGB 12.9* 09/23/2013   HCT 39.0 09/23/2013   MCV 91.3 09/23/2013   PLT 75* 09/23/2013      Component Value Date/Time   NA 138 08/12/2013 0832   NA 138 04/13/2010 1417   K 3.9 08/12/2013 0832   K 3.2* 04/13/2010 1417   CL 106 04/13/2010 1417   CO2 29 08/12/2013 0832   CO2 26 04/13/2010 1417   GLUCOSE 229* 08/12/2013 0832   GLUCOSE 82 04/13/2010 1417   BUN 11.8 08/12/2013 0832   BUN 6 04/13/2010 1417   CREATININE 0.9 08/12/2013 0832   CREATININE 0.77 04/13/2010 1417   CALCIUM 8.5 08/12/2013 0832   CALCIUM 8.6 04/13/2010 1417   PROT 7.1 08/12/2013 0832   PROT 7.9 04/13/2010 1417   ALBUMIN 2.2* 08/12/2013 0832   ALBUMIN 2.8* 04/13/2010 1417   AST 178* 08/12/2013 1572  AST 206* 04/13/2010 1417   ALT 135* 08/12/2013 0832   ALT 177* 04/13/2010 1417   ALKPHOS 153* 08/12/2013 0832   ALKPHOS 120* 04/13/2010 1417   BILITOT 0.59 08/12/2013 0832   BILITOT 0.9 04/13/2010 1417   GFRNONAA >60 04/13/2010 1417   GFRAA  Value: >60        The eGFR has been calculated using the MDRD equation. This calculation has not been validated in all clinical situations. eGFR's persistently <60 mL/min signify possible Chronic Kidney Disease. 04/13/2010 1417     ASSESSMENT AND PLAN   58 years old Caucasian male, presenting with frequent motor and voice tics, in the setting of hemochromatosis, stage IV hepatic cirrhosis, hepatitis C,   He responded to [name] well, but complains of tongue swelling with certain generic form of gabapentin, he sleeps well with 300 mg every day,  1. I have right brand-name [name] 100 mg 3 tablets 3 times a  day 2,, Continue hepatitis C treatment, return to clinic in 6-9 months with Rhae Hammock, M.D. Ph.D.  [name] [address], [place],  [address] [phone]

## 2013-10-07 ENCOUNTER — Other Ambulatory Visit (HOSPITAL_BASED_OUTPATIENT_CLINIC_OR_DEPARTMENT_OTHER): Payer: Medicaid Other

## 2013-10-07 LAB — COMPREHENSIVE METABOLIC PANEL (CC13)
ALT: 88 U/L — AB (ref 0–55)
AST: 131 U/L — AB (ref 5–34)
Albumin: 2 g/dL — ABNORMAL LOW (ref 3.5–5.0)
Alkaline Phosphatase: 170 U/L — ABNORMAL HIGH (ref 40–150)
Anion Gap: 5 mEq/L (ref 3–11)
BILIRUBIN TOTAL: 0.48 mg/dL (ref 0.20–1.20)
BUN: 11.9 mg/dL (ref 7.0–26.0)
CHLORIDE: 105 meq/L (ref 98–109)
CO2: 23 mEq/L (ref 22–29)
CREATININE: 0.9 mg/dL (ref 0.7–1.3)
Calcium: 8.4 mg/dL (ref 8.4–10.4)
Glucose: 356 mg/dl — ABNORMAL HIGH (ref 70–140)
Potassium: 4.2 mEq/L (ref 3.5–5.1)
Sodium: 133 mEq/L — ABNORMAL LOW (ref 136–145)
TOTAL PROTEIN: 7 g/dL (ref 6.4–8.3)

## 2013-10-07 LAB — CBC WITH DIFFERENTIAL/PLATELET
BASO%: 0.2 % (ref 0.0–2.0)
BASOS ABS: 0 10*3/uL (ref 0.0–0.1)
EOS ABS: 0.1 10*3/uL (ref 0.0–0.5)
EOS%: 3.8 % (ref 0.0–7.0)
HEMATOCRIT: 38 % — AB (ref 38.4–49.9)
HEMOGLOBIN: 12.5 g/dL — AB (ref 13.0–17.1)
LYMPH#: 1 10*3/uL (ref 0.9–3.3)
LYMPH%: 27.1 % (ref 14.0–49.0)
MCH: 30.2 pg (ref 27.2–33.4)
MCHC: 32.9 g/dL (ref 32.0–36.0)
MCV: 91.7 fL (ref 79.3–98.0)
MONO#: 0.5 10*3/uL (ref 0.1–0.9)
MONO%: 12.4 % (ref 0.0–14.0)
NEUT#: 2.1 10*3/uL (ref 1.5–6.5)
NEUT%: 56.5 % (ref 39.0–75.0)
Platelets: 59 10*3/uL — ABNORMAL LOW (ref 140–400)
RBC: 4.15 10*6/uL — ABNORMAL LOW (ref 4.20–5.82)
RDW: 16.6 % — AB (ref 11.0–14.6)
WBC: 3.6 10*3/uL — AB (ref 4.0–10.3)

## 2013-10-07 LAB — FERRITIN CHCC: Ferritin: 34 ng/ml (ref 22–316)

## 2013-10-12 ENCOUNTER — Ambulatory Visit (HOSPITAL_BASED_OUTPATIENT_CLINIC_OR_DEPARTMENT_OTHER): Payer: Medicaid Other | Admitting: Internal Medicine

## 2013-10-12 ENCOUNTER — Other Ambulatory Visit (HOSPITAL_BASED_OUTPATIENT_CLINIC_OR_DEPARTMENT_OTHER): Payer: Medicaid Other

## 2013-10-12 ENCOUNTER — Telehealth: Payer: Self-pay | Admitting: Internal Medicine

## 2013-10-12 DIAGNOSIS — B1920 Unspecified viral hepatitis C without hepatic coma: Secondary | ICD-10-CM

## 2013-10-12 DIAGNOSIS — E119 Type 2 diabetes mellitus without complications: Secondary | ICD-10-CM

## 2013-10-12 DIAGNOSIS — K7460 Unspecified cirrhosis of liver: Secondary | ICD-10-CM

## 2013-10-12 LAB — CBC WITH DIFFERENTIAL/PLATELET
BASO%: 0.3 % (ref 0.0–2.0)
BASOS ABS: 0 10*3/uL (ref 0.0–0.1)
EOS ABS: 0.2 10*3/uL (ref 0.0–0.5)
EOS%: 4.6 % (ref 0.0–7.0)
HCT: 37.8 % — ABNORMAL LOW (ref 38.4–49.9)
HEMOGLOBIN: 12.6 g/dL — AB (ref 13.0–17.1)
LYMPH%: 31.8 % (ref 14.0–49.0)
MCH: 30.1 pg (ref 27.2–33.4)
MCHC: 33.3 g/dL (ref 32.0–36.0)
MCV: 90.3 fL (ref 79.3–98.0)
MONO#: 0.5 10*3/uL (ref 0.1–0.9)
MONO%: 12.2 % (ref 0.0–14.0)
NEUT%: 51.1 % (ref 39.0–75.0)
NEUTROS ABS: 2 10*3/uL (ref 1.5–6.5)
PLATELETS: 65 10*3/uL — AB (ref 140–400)
RBC: 4.19 10*6/uL — ABNORMAL LOW (ref 4.20–5.82)
RDW: 16.1 % — AB (ref 11.0–14.6)
WBC: 3.9 10*3/uL — ABNORMAL LOW (ref 4.0–10.3)
lymph#: 1.2 10*3/uL (ref 0.9–3.3)

## 2013-10-12 NOTE — Patient Instructions (Signed)
TIBC, Transferrin, Total Iron-Binding Capacity  This is a test to learn about your body's ability to transport iron. Your caregiver may do this when they suspect you may have too much or too little iron in your body because of a variety of conditions. The test also helps to monitor liver function and nutrition. TIBC measures the total capacity of your blood to transport iron. TIBC correlates with the amount of the protein transferrin in your blood. Transferrin is a protein that attaches iron molecules and transports iron to the blood plasma. Transferrin is largely made in the liver and regulates your body's iron absorption into the blood.  PREPARATION FOR TEST A blood sample is obtained by inserting a needle into a vein in the arm. NORMAL FINDINGS  Iron  Male: 80-180 mcg/dL or 16-1014-32 micromole/L (SI units)  Male: 60-160 mcg/dL or 96-0411-29 micromole/L (SI units)  Newborn: 100-250 mcg/dL  Child: 54-09850-120 mcg/dL  TIBC  119-147250-460 mcg/dL or 82-9545-82 micromole/L (SI units)  Transferrin  Adult male: 215-365 mg/dL or 6.21-3.082.15-3.65 g/L (SI units)  Adult male: 250-380 mg/dL or 6.57-8.462.50-3.80 g/L (SI units)  Newborn: 130-275 mg/dL  Child: 962-952203-360 mg/dL  Transferrin Saturation  Males: 20% to 50%  Females: 15% to 50% Ranges for normal findings may vary among different laboratories and hospitals. You should always check with your doctor after having lab work or other tests done to discuss the meaning of your test results and whether your values are considered within normal limits. MEANING OF TEST  Your caregiver will go over the test results with you and discuss the importance and meaning of your results, as well as treatment options and the need for additional tests if necessary. OBTAINING THE TEST RESULTS It is your responsibility to obtain your test results. Ask the lab or department performing the test when and how you will get your results. Document Released: 06/30/2004 Document Revised: 08/20/2011  Document Reviewed: 05/09/2008 [facility]ExitCare Patient Information 2014 [place]Care, [place]. Ferritin This is done to test for anemia. Anemia occurs when the amount of hemoglobin (found in the red blood cells) drops below normal. Hemoglobin is necessary for the transportation of oxygen throughout the body. Blood tests may show a variety of common, treatable abnormalities that can lead to problems associated with anemia. Iron deficiency anemia is the most common of the anemias. It is usually due to bleeding. In women, iron deficiency may be due to heavy menstrual periods. In older women and in men, the bleeding is usually from disease of the intestines. In children and in pregnant women, the body needs more iron. Iron deficiency may be due to simply not eating enough iron in the diet. Iron deficiency may also result from some extreme diets. Treatment of iron deficiency usually involves iron supplements. In older women and in men, there is usually some further testing needed to determine why the person is iron deficient.  PREPARATION FOR TEST A blood sample is obtained by inserting a needle into a vein in the arm. NORMAL FINDINGS Male: 12-300ng/ml or 12-300  g/L (SI units) Male:  10-150 ng/ml or 10-150  g/L (SI units) Children/adolescent:  Newborn: 25-200 ng/ml  1 month: 200-600 ng/ml  2-5 months: 50-200 ng/ml  6 months-15 years: 7-142 ng/ml Ranges for normal findings may vary among different laboratories and hospitals. You should always check with your doctor after having lab work or other tests done to discuss the meaning of your test results and whether your values are considered within normal limits. MEANING OF TEST  Your caregiver will go over the test results with you and discuss the importance and meaning of your results, as well as treatment options and the need for additional tests if necessary. OBTAINING THE TEST RESULTS  It is your responsibility to obtain your test results. Ask the lab or  department performing the test when and how you will get your results. Document Released: 06/20/2004 Document Revised: 08/20/2011 Document Reviewed: 05/07/2008 [facility]ExitCare Patient Information 2014 [place]ExitCare, [place]LL[place]. Hemochromatosis You have hemochromatosis. This is when you have too much iron in your body. Iron is necessary in your body for many reasons. Iron works in the hemoglobin (a protein in red blood cells) which carries oxygen to your body. However, too much iron can lead to long-term health problems. When the iron builds up in the body it affects the liver, heart, and pancreas. This can lead to heart failure, diabetes, cirrhosis, cancer, impotence, and depression. Fatigue is a common complaint. Additional symptoms include headache, shortness of breath, heart irregularities, and tanning skin. The most common cause of this disease is hereditary (passed from parents). Another cause is a diet that contains too much iron. This can happen to people who eat a lot of red meat, take supplemental iron, take vitamin C, and drink alcohol. Frequent transfusions may also lead to iron overload. DIAGNOSIS  Three simple blood tests will usually make the diagnosis of hemochromatosis.  A transferrin iron saturation percentage test will measure your ability to circulate iron.  A ferritin test will measure the amount of iron you have in storage.  A hemoglobin test will measure how much iron is circulating in your blood. HOME CARE INSTRUCTIONS  Follow the diet provided for you by your caregiver. The recommended daily allowance for iron is:  15 mg per day for women ages 2312 to 6949.  30 mg per day for women who are pregnant or lactating.  10 mg per day for men and women ages 5750 and older. It is important to follow your caregiver's instructions because you can lead a normal life with treatment. If left untreated, this disease can be life-threatening. MAKE SURE YOU:   Understand these instructions.  Will watch your  condition.  Will get help right away if you are not doing well or get worse. Document Released: 05/25/2000 Document Revised: 08/20/2011 Document Reviewed: 05/28/2005 [facility] Patient Information 2014 [place] ParkExitCare, [place]LLC.

## 2013-10-12 NOTE — Progress Notes (Signed)
[facility] OFFICE PROGRESS NOTE  Alva Garnet., MD [address] [place] [address]  DIAGNOSIS: Other hemochromatosis - Plan: CBC with Differential, Comprehensive metabolic panel (Cmet) - [facility], Ferritin  Hepatitis C  Chief Complaint  Patient presents with  . Follow-up    CURRENT THERAPY: Phlebotomy to maintain a ferritin less than 50 and hold phlebotomy if hemoglobin is less than 11. We will start every month on 10/12/2013.  He will start treatment for hepatitis C in the coming weeks and may require a hold of his phlebotomy.   INTERVAL HISTORY: [name] 58 y.o. male with a history of  Hereditary hemochromatosis is here for follow-up. He was last seen by [place] on 08/12/2013.   Of note patient also has a history of hepatitis C and DM2 on insulin. He was managed by Dr. Gardiner Coins from 2008 till presently. He desires to follow locally for his hematology requiring bi-weekly therapeutic phlebotomy. He noted phlebotomy times would be in excess five hours and made long commute times from [place] to [place]. Most of his care including his endocrinology and neurology remains at [facility]   He also goes to [facility]. He has a history of essential tremors and goes to neurologist at [facility]. The hemachromatosis was diagnosed 7 years ago. He also has diabetes managed by endocrinology. He denies any recent hypoglycemic episodes. He notes hyperglycemia recently. His diabetes was diagnosed in 2007.   He reports a limp due to his right leg hurting. He has a right hip x-ray planned. In addition, his MR of brain is scheduled for 20th of April. He reports an EGD done 2 years ago with varices reported. He has been on nadolol. His hepatitis C is managed by Dr. Dahlia Client with biannual ultrasound. His HBA1C was 8.4. He reports taking lactulose as needed. He has not needed it in about one year. He was diagnosed with the above when he moved from [place] in 2007. He is  adopted and does not know his family history. He lives with his significant other of 13 years. He has four children. His last hospitalization was during Christmas 2013 for pneumonia.   Today, he reports that he has some fatigue.  He is seeing hepatologist and will start treatment for his hepatitis c.  Otherwise, he reports feeling well.    MEDICAL HISTORY: Past Medical History  Diagnosis Date  . Diabetes   . High cholesterol   . Anxiety   . Hepatitis C     INTERIM HISTORY: has Diabetes; High cholesterol; Anxiety; Hepatitis C; Other hemochromatosis; Tremors of nervous system; and Tic on his problem list.    ALLERGIES:  is allergic to sulfa antibiotics; sulfamethoxazole; and testosterone.  MEDICATIONS: has a current medication list which includes the following prescription(s): gabapentin, glucagon, insulin aspart, insulin glargine, and nadolol.  SURGICAL HISTORY:  Past Surgical History  Procedure Laterality Date  . None      REVIEW OF SYSTEMS:   Constitutional: Denies fevers, chills or abnormal weight loss Eyes: Denies blurriness of vision Ears, nose, mouth, throat, and face: Denies mucositis or sore throat Respiratory: Denies cough, dyspnea or wheezes Cardiovascular: Denies palpitation, chest discomfort or lower extremity swelling Gastrointestinal:  Denies nausea, heartburn or change in bowel habits Skin: Denies abnormal skin rashes Lymphatics: Denies new lymphadenopathy or easy bruising Neurological:Denies numbness, tingling or new weaknesses Behavioral/Psych: Mood is stable, no new changes  All other systems were reviewed with the patient and are negative.  PHYSICAL EXAMINATION: ECOG PERFORMANCE  STATUS: 0 - Asymptomatic  Blood pressure 104/65, pulse 58, temperature 97.7 F (36.5 C), temperature source Oral, resp. rate 18, height 6' (1.829 m), weight 163 lb 14.4 oz (74.345 kg), SpO2 100.00%.  GENERAL:alert, no distress and comfortable SKIN: skin color, texture, turgor  are normal, no rashes or significant lesions EYES: normal, Conjunctiva are pink and non-injected, sclera clear OROPHARYNX:no exudate, no erythema and lips, buccal mucosa, and tongue normal  NECK: supple, thyroid normal size, non-tender, without nodularity LYMPH:  no palpable lymphadenopathy in the cervical, axillary or supraclavicular LUNGS: clear to auscultation and percussion with normal breathing effort HEART: regular rate & rhythm and no murmurs and no lower extremity edema ABDOMEN:abdomen soft, non-tender and normal bowel sounds; enlarged spleen about 2 cm below costaphrenic angle. No additional stigmata of liver disease.  Musculoskeletal:no cyanosis of digits and no clubbing  NEURO: alert & oriented x 3 with fluent speech, no focal motor/sensory deficits  LABORATORY DATA: Results for orders placed in visit on 10/12/13 (from the past 48 hour(s))  CBC WITH DIFFERENTIAL     Status: Abnormal   Collection Time    10/12/13  8:14 AM      Result Value Ref Range   WBC 3.9 (*) 4.0 - 10.3 10e3/uL   NEUT# 2.0  1.5 - 6.5 10e3/uL   HGB 12.6 (*) 13.0 - 17.1 g/dL   HCT 16.137.8 (*) 09.638.4 - 04.549.9 %   Platelets 65 (*) 140 - 400 10e3/uL   MCV 90.3  79.3 - 98.0 fL   MCH 30.1  27.2 - 33.4 pg   MCHC 33.3  32.0 - 36.0 g/dL   RBC 4.094.19 (*) 8.114.20 - 9.145.82 10e6/uL   RDW 16.1 (*) 11.0 - 14.6 %   lymph# 1.2  0.9 - 3.3 10e3/uL   MONO# 0.5  0.1 - 0.9 10e3/uL   Eosinophils Absolute 0.2  0.0 - 0.5 10e3/uL   Basophils Absolute 0.0  0.0 - 0.1 10e3/uL   NEUT% 51.1  39.0 - 75.0 %   LYMPH% 31.8  14.0 - 49.0 %   MONO% 12.2  0.0 - 14.0 %   EOS% 4.6  0.0 - 7.0 %   BASO% 0.3  0.0 - 2.0 %       Labs:  Lab Results  Component Value Date   WBC 3.9* 10/12/2013   HGB 12.6* 10/12/2013   HCT 37.8* 10/12/2013   MCV 90.3 10/12/2013   PLT 65* 10/12/2013   NEUTROABS 2.0 10/12/2013      Chemistry      Component Value Date/Time   NA 133* 10/07/2013 0806   NA 138 04/13/2010 1417   K 4.2 10/07/2013 0806   K 3.2* 04/13/2010 1417   CL  106 04/13/2010 1417   CO2 23 10/07/2013 0806   CO2 26 04/13/2010 1417   BUN 11.9 10/07/2013 0806   BUN 6 04/13/2010 1417   CREATININE 0.9 10/07/2013 0806   CREATININE 0.77 04/13/2010 1417      Component Value Date/Time   CALCIUM 8.4 10/07/2013 0806   CALCIUM 8.6 04/13/2010 1417   ALKPHOS 170* 10/07/2013 0806   ALKPHOS 120* 04/13/2010 1417   AST 131* 10/07/2013 0806   AST 206* 04/13/2010 1417   ALT 88* 10/07/2013 0806   ALT 177* 04/13/2010 1417   BILITOT 0.48 10/07/2013 0806   BILITOT 0.9 04/13/2010 1417     CBC:  Recent Labs Lab 10/07/13 0806 10/12/13 0814  WBC 3.6* 3.9*  NEUTROABS 2.1 2.0  HGB 12.5* 12.6*  HCT 38.0* 37.8*  MCV 91.7 90.3  PLT 59* 65*   Iron/TIBC/Ferritin    Component Value Date/Time   IRON 76 07/14/2013 0841   TIBC 334 07/14/2013 0841   FERRITIN 34 10/07/2013 0806   Studies:  No results found.   RADIOGRAPHIC STUDIES: Dg Hip Complete Right  07/10/2013   CLINICAL DATA:  Right groin pain for approximately 1 year. No known injury.  EXAM: RIGHT HIP - COMPLETE 2+ VIEW  COMPARISON:  None.  FINDINGS: The mineralization and alignment are normal. There is no evidence of acute fracture or dislocation. There is no evidence of femoral head avascular necrosis. No significant arthropathic changes are identified.  IMPRESSION: Negative right hip radiographs.   Electronically Signed   By: Roxy HorsemanBill  Veazey M.D.   On: 07/10/2013 10:39    ASSESSMENT: [name] 58 y.o. male with a history of Other hemochromatosis - Plan: CBC with Differential, Comprehensive metabolic panel (Cmet) - [facility], Ferritin  Hepatitis C   PLAN:  1. Hereditary hemochromatosis.  --We discuss maintaining his ferritin less than 50. His ferritin was 39 on 10/07/2013.  Patient reports that his needs normal saline with his phlebotomy. We will continue phlebotomy monthly prn (if not on hepatitis C treatment). We hope to maintain a hemoglobin greater than 11 (20% of his initial hemoglobin baseline of 13.7) and hold  phlebotomy if it is much lower than that.   He was counseled on symptoms of ortho stasis.     2. DM2 secondary #1.  --Continue insulin sliding scale and follow-up with [facility].   3. 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  --Per his report, he has a follow up with GI to discuss treatment. We will follow closely.  --Continue nadolol. He was counseled to report any bleeding.   4. Follow-up.  --Labs including CBC,ferritin monthly with phlebotomy if ferritin greater than 50 and hemoglobin greater than 11; Labs in 3 months.   We may HOLD phlebotomy if on active hepatitis C.   All questions were answered. The patient knows to call the clinic with any problems, questions or concerns. We can certainly see the patient much sooner if necessary.  I spent 10 minutes counseling the patient face to face. The total time spent in the appointment was 15 minutes.    Myra Rudeavid Enora Trillo, MD 10/12/2013 9:03 AM

## 2013-10-12 NOTE — Telephone Encounter (Signed)
gve the pt his June,aug 2015 appt calendar.

## 2013-11-09 ENCOUNTER — Other Ambulatory Visit (HOSPITAL_BASED_OUTPATIENT_CLINIC_OR_DEPARTMENT_OTHER): Payer: Medicaid Other

## 2013-11-09 LAB — CBC WITH DIFFERENTIAL/PLATELET
BASO%: 0.6 % (ref 0.0–2.0)
Basophils Absolute: 0 10*3/uL (ref 0.0–0.1)
EOS ABS: 0.1 10*3/uL (ref 0.0–0.5)
EOS%: 4.4 % (ref 0.0–7.0)
HCT: 38.8 % (ref 38.4–49.9)
HGB: 12.8 g/dL — ABNORMAL LOW (ref 13.0–17.1)
LYMPH%: 37.1 % (ref 14.0–49.0)
MCH: 30.3 pg (ref 27.2–33.4)
MCHC: 33 g/dL (ref 32.0–36.0)
MCV: 91.6 fL (ref 79.3–98.0)
MONO#: 0.3 10*3/uL (ref 0.1–0.9)
MONO%: 10.3 % (ref 0.0–14.0)
NEUT#: 1.5 10*3/uL (ref 1.5–6.5)
NEUT%: 47.6 % (ref 39.0–75.0)
PLATELETS: 61 10*3/uL — AB (ref 140–400)
RBC: 4.23 10*6/uL (ref 4.20–5.82)
RDW: 18.3 % — AB (ref 11.0–14.6)
WBC: 3.1 10*3/uL — ABNORMAL LOW (ref 4.0–10.3)
lymph#: 1.2 10*3/uL (ref 0.9–3.3)

## 2013-11-09 LAB — FERRITIN CHCC: FERRITIN: 48 ng/mL (ref 22–316)

## 2013-11-18 ENCOUNTER — Encounter: Payer: Self-pay | Admitting: Internal Medicine

## 2013-11-19 ENCOUNTER — Encounter: Payer: Self-pay | Admitting: *Deleted

## 2013-11-19 NOTE — Telephone Encounter (Signed)
Message printed, placed in provider in basket for review upon his return tomorrow.  Message sent to patient.

## 2013-11-24 ENCOUNTER — Telehealth: Payer: Self-pay | Admitting: Medical Oncology

## 2013-11-24 NOTE — Telephone Encounter (Signed)
I called pt to let him know we can do his every 2 week lab here if his liver specialist will send over orders. [facility] there to get it done. So, he will not need us to set up any appointments. He voiced understanding.

## 2013-12-07 ENCOUNTER — Other Ambulatory Visit (HOSPITAL_BASED_OUTPATIENT_CLINIC_OR_DEPARTMENT_OTHER): Payer: Medicaid Other

## 2013-12-07 LAB — CBC WITH DIFFERENTIAL/PLATELET
BASO%: 0.4 % (ref 0.0–2.0)
Basophils Absolute: 0 10*3/uL (ref 0.0–0.1)
EOS%: 4.8 % (ref 0.0–7.0)
Eosinophils Absolute: 0.2 10*3/uL (ref 0.0–0.5)
HCT: 37 % — ABNORMAL LOW (ref 38.4–49.9)
HGB: 12 g/dL — ABNORMAL LOW (ref 13.0–17.1)
LYMPH#: 1 10*3/uL (ref 0.9–3.3)
LYMPH%: 25.5 % (ref 14.0–49.0)
MCH: 31.1 pg (ref 27.2–33.4)
MCHC: 32.4 g/dL (ref 32.0–36.0)
MCV: 95.8 fL (ref 79.3–98.0)
MONO#: 0.3 10*3/uL (ref 0.1–0.9)
MONO%: 8 % (ref 0.0–14.0)
NEUT#: 2.3 10*3/uL (ref 1.5–6.5)
NEUT%: 61.3 % (ref 39.0–75.0)
Platelets: 76 10*3/uL — ABNORMAL LOW (ref 140–400)
RBC: 3.86 10*6/uL — AB (ref 4.20–5.82)
RDW: 17.5 % — ABNORMAL HIGH (ref 11.0–14.6)
WBC: 3.8 10*3/uL — AB (ref 4.0–10.3)

## 2013-12-07 LAB — FERRITIN CHCC: FERRITIN: 115 ng/mL (ref 22–316)

## 2013-12-09 ENCOUNTER — Ambulatory Visit (INDEPENDENT_AMBULATORY_CARE_PROVIDER_SITE_OTHER): Payer: Medicaid Other | Admitting: Podiatrist

## 2013-12-09 ENCOUNTER — Encounter: Payer: Self-pay | Admitting: Podiatrist

## 2013-12-09 VITALS — BP 117/73 | HR 70 | Resp 14 | Ht 72.0 in | Wt 155.0 lb

## 2013-12-09 DIAGNOSIS — M79673 Pain in unspecified foot: Secondary | ICD-10-CM

## 2013-12-09 DIAGNOSIS — B353 Tinea pedis: Secondary | ICD-10-CM

## 2013-12-09 DIAGNOSIS — E1149 Type 2 diabetes mellitus with other diabetic neurological complication: Secondary | ICD-10-CM

## 2013-12-09 DIAGNOSIS — M79609 Pain in unspecified limb: Secondary | ICD-10-CM

## 2013-12-09 DIAGNOSIS — G588 Other specified mononeuropathies: Secondary | ICD-10-CM

## 2013-12-09 DIAGNOSIS — B351 Tinea unguium: Secondary | ICD-10-CM

## 2013-12-09 DIAGNOSIS — E1140 Type 2 diabetes mellitus with diabetic neuropathy, unspecified: Secondary | ICD-10-CM

## 2013-12-09 DIAGNOSIS — G5760 Lesion of plantar nerve, unspecified lower limb: Secondary | ICD-10-CM

## 2013-12-09 MED ORDER — KETOCONAZOLE 2 % EX CREA: 1.0000 "application " | TOPICAL_CREAM | Freq: Every day | CUTANEOUS | Status: DC

## 2013-12-09 MED ORDER — CICLOPIROX 8 % EX SOLN: Freq: Every day | CUTANEOUS | Status: DC

## 2013-12-09 NOTE — Progress Notes (Signed)
   Subjective:    Patient ID: John [name], male    DOB: April 04, 1956, 58 y.o.   MRN: [id number]  HPI Comments: Pt presents for diabetic foot care and exam.  Pt states he has had a history athletes foot infections and treatment for nerve problems between left 2, 3rd toes with cortisone injections per the pt.  Diabetes  Foot Pain      Review of Systems  All other systems reviewed and are negative.      Objective:   Physical Exam GENERAL APPEARANCE: Alert, conversant. A bit disheveled, No acute distress.  VASCULAR: Pedal pulses palpable at 2/4 DP and PT bilateral.  Capillary refill time is immediate to all digits,  Proximal to distal cooling it warm to warm.  Digital hair growth is present bilateral  NEUROLOGIC: sensation is intact epicritically and protectively to 5.07 monofilament at 5/5 sites bilateral.  Light touch is intact bilateral, vibratory sensation intact bilateral, achilles tendon reflex is intact bilateral. Patient relates sensory neuropathy with numbness and some tingling at the distal portion of bilateral feet. He also relates pain at the second interspace left greater than right consistent with neuromas. MUSCULOSKELETAL: acceptable muscle strength, tone and stability bilateral.  Intrinsic muscluature intact bilateral.  Rectus appearance of foot and digits noted bilateral.   DERMATOLOGIC: Patient's toenails are thickened, discolored, dystrophic, clinically mycotic. He has interdigital maceration and tinea pedis noted bilateral. No preulcerative lesions are seen, no interdigital maceration noted.  No open lesions present.       Assessment & Plan:  Neuroma, tinea pedis, symptomatic mycotic toenails  Plan: The patient is undergoing active treatment for hepatitis C at [facility] and would like to avoid any medications including injections that may effect his treatment. Therefore we did not do any injections for the neuromas at today's visit and I recommended waiting until  he is finished with his therapy before doing so. I did trim his toenails without complication. I recommended Penlac nail lacquer and ketoconazole cream for the tinea and toenails. He was be seen back as needed for followup and for treatment of the neuromas.

## 2013-12-10 ENCOUNTER — Telehealth: Payer: Self-pay | Admitting: Internal Medicine

## 2013-12-10 ENCOUNTER — Telehealth: Payer: Self-pay | Admitting: *Deleted

## 2013-12-10 ENCOUNTER — Telehealth: Payer: Self-pay

## 2013-12-10 NOTE — Telephone Encounter (Signed)
I called and left him a message that he forgot his prescription from yesterday.  Please give me a call back.

## 2013-12-10 NOTE — Telephone Encounter (Signed)
Pt called stating he is returning Dr Benjiman Corehism's call from yesterday. He will be home most of today. Information passed to Dr Rosie Fatehism to call pt back.

## 2013-12-10 NOTE — Telephone Encounter (Signed)
Left a message

## 2013-12-20 DIAGNOSIS — H40033 Anatomical narrow angle, bilateral: Secondary | ICD-10-CM | POA: Insufficient documentation

## 2014-01-06 ENCOUNTER — Telehealth: Payer: Self-pay | Admitting: Internal Medicine

## 2014-01-12 ENCOUNTER — Other Ambulatory Visit: Payer: Medicaid Other

## 2014-01-12 ENCOUNTER — Ambulatory Visit: Payer: Medicaid Other

## 2014-01-28 ENCOUNTER — Encounter: Payer: Self-pay | Admitting: Internal Medicine

## 2014-01-28 ENCOUNTER — Ambulatory Visit (HOSPITAL_BASED_OUTPATIENT_CLINIC_OR_DEPARTMENT_OTHER): Payer: Medicaid Other | Admitting: Internal Medicine

## 2014-01-28 ENCOUNTER — Telehealth: Payer: Self-pay | Admitting: Internal Medicine

## 2014-01-28 ENCOUNTER — Other Ambulatory Visit (HOSPITAL_BASED_OUTPATIENT_CLINIC_OR_DEPARTMENT_OTHER): Payer: Medicaid Other

## 2014-01-28 DIAGNOSIS — E119 Type 2 diabetes mellitus without complications: Secondary | ICD-10-CM

## 2014-01-28 DIAGNOSIS — B182 Chronic viral hepatitis C: Secondary | ICD-10-CM

## 2014-01-28 DIAGNOSIS — I8500 Esophageal varices without bleeding: Secondary | ICD-10-CM

## 2014-01-28 DIAGNOSIS — B1920 Unspecified viral hepatitis C without hepatic coma: Secondary | ICD-10-CM

## 2014-01-28 DIAGNOSIS — R7402 Elevation of levels of lactic acid dehydrogenase (LDH): Secondary | ICD-10-CM

## 2014-01-28 DIAGNOSIS — R740 Nonspecific elevation of levels of transaminase and lactic acid dehydrogenase [LDH]: Secondary | ICD-10-CM

## 2014-01-28 LAB — CBC WITH DIFFERENTIAL/PLATELET
BASO%: 0.5 % (ref 0.0–2.0)
Basophils Absolute: 0 10*3/uL (ref 0.0–0.1)
EOS%: 4.1 % (ref 0.0–7.0)
Eosinophils Absolute: 0.2 10*3/uL (ref 0.0–0.5)
HCT: 39.5 % (ref 38.4–49.9)
HGB: 12.8 g/dL — ABNORMAL LOW (ref 13.0–17.1)
LYMPH#: 1 10*3/uL (ref 0.9–3.3)
LYMPH%: 24.3 % (ref 14.0–49.0)
MCH: 31.8 pg (ref 27.2–33.4)
MCHC: 32.4 g/dL (ref 32.0–36.0)
MCV: 98.1 fL — ABNORMAL HIGH (ref 79.3–98.0)
MONO#: 0.4 10*3/uL (ref 0.1–0.9)
MONO%: 10.4 % (ref 0.0–14.0)
NEUT#: 2.4 10*3/uL (ref 1.5–6.5)
NEUT%: 60.7 % (ref 39.0–75.0)
Platelets: 93 10*3/uL — ABNORMAL LOW (ref 140–400)
RBC: 4.03 10*6/uL — AB (ref 4.20–5.82)
RDW: 14.7 % — AB (ref 11.0–14.6)
WBC: 3.9 10*3/uL — AB (ref 4.0–10.3)

## 2014-01-28 LAB — COMPREHENSIVE METABOLIC PANEL (CC13)
ALBUMIN: 2.3 g/dL — AB (ref 3.5–5.0)
ALT: 19 U/L (ref 0–55)
ANION GAP: 6 meq/L (ref 3–11)
AST: 32 U/L (ref 5–34)
Alkaline Phosphatase: 130 U/L (ref 40–150)
BILIRUBIN TOTAL: 1.02 mg/dL (ref 0.20–1.20)
BUN: 13.7 mg/dL (ref 7.0–26.0)
CALCIUM: 8.8 mg/dL (ref 8.4–10.4)
CHLORIDE: 101 meq/L (ref 98–109)
CO2: 28 meq/L (ref 22–29)
Creatinine: 0.9 mg/dL (ref 0.7–1.3)
GLUCOSE: 215 mg/dL — AB (ref 70–140)
POTASSIUM: 3.9 meq/L (ref 3.5–5.1)
SODIUM: 136 meq/L (ref 136–145)
TOTAL PROTEIN: 6.7 g/dL (ref 6.4–8.3)

## 2014-01-28 LAB — FERRITIN CHCC: Ferritin: 163 ng/ml (ref 22–316)

## 2014-01-28 NOTE — Telephone Encounter (Signed)
gv adn printed appt sched and avs for pt for Aug adn sEpt....sed added tx.

## 2014-01-28 NOTE — Progress Notes (Signed)
[facility] OFFICE PROGRESS NOTE  Alva Garnet., MD [address] [place] [address]  DIAGNOSIS: Other hemochromatosis - Plan: CBC with Differential, Ferritin, CBC with Differential, Ferritin, CANCELED: CBC with Differential, CANCELED: Ferritin, CANCELED: CBC with Differential, CANCELED: Ferritin  Chronic hepatitis C without hepatic coma  Chief Complaint  Patient presents with  . Other hemochromatosis    CURRENT THERAPY: Phlebotomy to maintain a ferritin less than 50 and hold phlebotomy if hemoglobin is less than 11. He started treatment with Ombitas-paritapre-titona-dasab for hepatitis C on 10/29/2013. HCV RNA quantitative not detected as on 12/29/2013 (Care Everywhere in [facility] lab results).     INTERVAL HISTORY: John [name] 58 y.o. male with a history of  Hereditary hemochromatosis is here for follow-up. He was last seen by [place] on 10/12/2013.   Of note patient also has a history of hepatitis C and DM2 on insulin. He was managed by Dr. Gardiner Coins from 2008 till presently. He desires to follow locally for his hematology requiring bi-weekly therapeutic phlebotomy. He noted phlebotomy times would be in excess five hours and made long commute times from [place] to [place]. Most of his care including his endocrinology and neurology remains at [facility].   He also goes to [facility]. He has a history of essential tremors and goes to neurologist at [facility]. The hemachromatosis was diagnosed 7 years ago. He also has diabetes managed by endocrinology. He denies any recent hypoglycemic episodes. He notes hyperglycemia recently. His diabetes was diagnosed in 2007.   He reports an EGD done 2 years ago with varices reported. He has been on nadolol. His hepatitis C is managed by Dr. Dahlia Client with biannual ultrasound. His HBA1C was 8.4. He reports taking lactulose as needed. He has not needed it in about one year. He was diagnosed  with the above when he moved from [place] in 2007. He is adopted and does not know his family history. He lives with his significant other of 13 years. He has four children. His last hospitalization was during Christmas 2013 for pneumonia.   Today, he reports that he has some mild fatigue but feels well overall. He is accompanied by his wife. Marland Kitchen  He is seeing hepatologist (Dr. Renae Fickle Schmeltzer) for his hepatitis c with a non-detectable viral load per patient.  Otherwise, he reports feeling well.    MEDICAL HISTORY: Past Medical History  Diagnosis Date  . Diabetes   . High cholesterol   . Anxiety   . Hepatitis C     INTERIM HISTORY: has Diabetes; High cholesterol; Anxiety; Hepatitis C; Other hemochromatosis; Tremors of nervous system; and Tic on his problem list.    ALLERGIES:  is allergic to sulfa antibiotics; sulfamethoxazole; and testosterone.  MEDICATIONS: has a current medication list which includes the following prescription(s): gabapentin, glucagon, insulin aspart, insulin glargine, nadolol, ombitas-paritapre-ritona-dasab, ribavirin, and ketoconazole.  SURGICAL HISTORY:  Past Surgical History  Procedure Laterality Date  . None      REVIEW OF SYSTEMS:   Constitutional: Denies fevers, chills or abnormal weight loss Eyes: Denies blurriness of vision Ears, nose, mouth, throat, and face: Denies mucositis or sore throat Respiratory: Denies cough, dyspnea or wheezes Cardiovascular: Denies palpitation, chest discomfort or lower extremity swelling Gastrointestinal:  Denies nausea, heartburn or change in bowel habits Skin: Denies abnormal skin rashes Lymphatics: Denies new lymphadenopathy or easy bruising Neurological:Denies numbness, tingling or new weaknesses Behavioral/Psych: Mood is stable, no new changes  All other systems were reviewed with  the patient and are negative.  PHYSICAL EXAMINATION: ECOG PERFORMANCE STATUS: 0 - Asymptomatic  Blood pressure 91/57, pulse 64,  temperature 98 F (36.7 C), temperature source Oral, resp. rate 18, height 6' (1.829 m), weight 160 lb 8 oz (72.802 kg), SpO2 100.00%.  GENERAL:alert, no distress and comfortable; bearded thin male.  SKIN: skin color, texture, turgor are normal, no rashes or significant lesions EYES: normal, Conjunctiva are pink and non-injected, sclera clear OROPHARYNX:no exudate, no erythema and lips, buccal mucosa, and tongue normal  NECK: supple, thyroid normal size, non-tender, without nodularity LYMPH:  no palpable lymphadenopathy in the cervical, axillary or supraclavicular LUNGS: clear to auscultation and percussion with normal breathing effort HEART: regular rate & rhythm and no murmurs and no lower extremity edema ABDOMEN:abdomen soft, non-tender and normal bowel sounds; enlarged spleen about 2 cm below costaphrenic angle. No additional stigmata of liver disease.  Musculoskeletal:no cyanosis of digits and no clubbing  NEURO: alert & oriented x 3 with fluent speech, no focal motor/sensory deficits  LABORATORY DATA: Labs:  Lab Results  Component Value Date   WBC 3.9* 01/28/2014   HGB 12.8* 01/28/2014   HCT 39.5 01/28/2014   MCV 98.1* 01/28/2014   PLT 93* 01/28/2014   NEUTROABS 2.4 01/28/2014      Chemistry      Component Value Date/Time   NA 136 01/28/2014 0906   NA 138 04/13/2010 1417   K 3.9 01/28/2014 0906   K 3.2* 04/13/2010 1417   CL 106 04/13/2010 1417   CO2 28 01/28/2014 0906   CO2 26 04/13/2010 1417   BUN 13.7 01/28/2014 0906   BUN 6 04/13/2010 1417   CREATININE 0.9 01/28/2014 0906   CREATININE 0.77 04/13/2010 1417      Component Value Date/Time   CALCIUM 8.8 01/28/2014 0906   CALCIUM 8.6 04/13/2010 1417   ALKPHOS 130 01/28/2014 0906   ALKPHOS 120* 04/13/2010 1417   AST 32 01/28/2014 0906   AST 206* 04/13/2010 1417   ALT 19 01/28/2014 0906   ALT 177* 04/13/2010 1417   BILITOT 1.02 01/28/2014 0906   BILITOT 0.9 04/13/2010 1417     CBC:  Recent Labs Lab 01/28/14 0906  WBC 3.9*   NEUTROABS 2.4  HGB 12.8*  HCT 39.5  MCV 98.1*  PLT 93*   Iron/TIBC/Ferritin    Component Value Date/Time   IRON 76 07/14/2013 0841   TIBC 334 07/14/2013 0841   FERRITIN 115 12/07/2013 0908   Studies:  No results found.   RADIOGRAPHIC STUDIES: None   ASSESSMENT: John [name] 58 y.o. male with a history of Other hemochromatosis - Plan: CBC with Differential, Ferritin, CBC with Differential, Ferritin, CANCELED: CBC with Differential, CANCELED: Ferritin, CANCELED: CBC with Differential, CANCELED: Ferritin  Chronic hepatitis C without hepatic coma   PLAN:  1. Hereditary hemochromatosis.  --We discuss maintaining his ferritin less than 50. His ferritin was 115 on 12/07/2013.  Patient reports that his needs normal saline with his phlebotomy. We will continue phlebotomy biweekly prn given he is stable while on  hepatitis C treatment. We hope to maintain a hemoglobin greater than 11 (20% of his initial hemoglobin baseline of 13.7) and hold phlebotomy if it is much lower than that.   He was counseled on symptoms of ortho stasis.   He will start phlebotomy tomorrow on 08/21.  He requests to always have labs including CBC and ferritin one day prior to consideration of phlebotomy.   2. DM2 secondary #1.  --Continue insulin sliding scale and follow-up  [facility].   3. 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  --Per his report, he has a follow up with GI to discuss treatment. We will follow closely.  --Continue nadolol. He was counseled to report any bleeding.  --His HCV level is non-detected (labs in Care Everywhere) with normalization of his transiminases.   4. Follow-up.  --Labs including CBC,ferritin biweekly (labs obtained one day prior at patient's request) with phlebotomy if ferritin greater than 50 and hemoglobin greater than 11; RCT in 1 month.     All questions were answered. The patient knows to call the clinic with  any problems, questions or concerns. We can certainly see the patient much sooner if necessary.  I spent 15 minutes counseling the patient face to face. The total time spent in the appointment was 25 minutes.    Riggin Cuttino, MD 01/28/2014 10:07 AM

## 2014-01-29 ENCOUNTER — Ambulatory Visit (HOSPITAL_BASED_OUTPATIENT_CLINIC_OR_DEPARTMENT_OTHER): Payer: Medicaid Other

## 2014-01-29 NOTE — Progress Notes (Signed)
Pt's blood pressure low 30 min. Past phlebotomy.  Pt denines any dizziness.  Has been up to bathroom without any problems.  Pt given more fluids to drink.

## 2014-01-29 NOTE — Patient Instructions (Signed)

## 2014-01-29 NOTE — Progress Notes (Signed)
Blood pressure up past drinking another 12 oz glass of Sprite.  Denies any dizziness or other symptoms of hypotension.  Able to stand without difficulity.  Discharger to home.  Encouraged to continue to drink more fluids.

## 2014-01-29 NOTE — Progress Notes (Signed)
John [name] presents today for phlebotomy per MD orders. Phlebotomy procedure started at 0815 and ended at 0830. 50 grams removed. Patient observed for 30 minutes after procedure without any incident. Patient tolerated procedure well. IV needle removed intact.

## 2014-02-10 ENCOUNTER — Telehealth: Payer: Self-pay | Admitting: *Deleted

## 2014-02-10 NOTE — Telephone Encounter (Signed)
See note

## 2014-02-11 ENCOUNTER — Other Ambulatory Visit (HOSPITAL_BASED_OUTPATIENT_CLINIC_OR_DEPARTMENT_OTHER): Payer: Medicaid Other

## 2014-02-11 LAB — CBC WITH DIFFERENTIAL/PLATELET
BASO%: 0 % (ref 0.0–2.0)
Basophils Absolute: 0 10*3/uL (ref 0.0–0.1)
EOS%: 6 % (ref 0.0–7.0)
Eosinophils Absolute: 0.2 10*3/uL (ref 0.0–0.5)
HCT: 38.4 % (ref 38.4–49.9)
HGB: 12.6 g/dL — ABNORMAL LOW (ref 13.0–17.1)
LYMPH#: 1.3 10*3/uL (ref 0.9–3.3)
LYMPH%: 31.7 % (ref 14.0–49.0)
MCH: 32.3 pg (ref 27.2–33.4)
MCHC: 32.8 g/dL (ref 32.0–36.0)
MCV: 98.5 fL — ABNORMAL HIGH (ref 79.3–98.0)
MONO#: 0.4 10*3/uL (ref 0.1–0.9)
MONO%: 10 % (ref 0.0–14.0)
NEUT#: 2.1 10*3/uL (ref 1.5–6.5)
NEUT%: 52.3 % (ref 39.0–75.0)
NRBC: 0 % (ref 0–0)
Platelets: 104 10*3/uL — ABNORMAL LOW (ref 140–400)
RBC: 3.9 10*6/uL — AB (ref 4.20–5.82)
RDW: 14.5 % (ref 11.0–14.6)
WBC: 4 10*3/uL (ref 4.0–10.3)

## 2014-02-11 LAB — FERRITIN CHCC: Ferritin: 84 ng/ml (ref 22–316)

## 2014-02-12 ENCOUNTER — Telehealth: Payer: Self-pay | Admitting: *Deleted

## 2014-02-12 NOTE — Telephone Encounter (Signed)
Received call from pt requesting results of CBC done yest.  These were given & he states that he bled a lot after last phleb & is on ribavirin & he thinks that at a ferritin of 85 he doesn't need a phleb.  He plans to have eye surg on the 15th & then the other eye 1 wk later.  He states that his liver doc doesn't think he needs the phleb since the ribavirin can cause anemia.  Will report to Dr. Gerrit Halls.  Pt can be reached @ [phone].

## 2014-02-25 ENCOUNTER — Other Ambulatory Visit (HOSPITAL_BASED_OUTPATIENT_CLINIC_OR_DEPARTMENT_OTHER): Payer: Medicaid Other

## 2014-02-25 LAB — CBC WITH DIFFERENTIAL/PLATELET
BASO%: 0.3 % (ref 0.0–2.0)
Basophils Absolute: 0 10*3/uL (ref 0.0–0.1)
EOS%: 5.7 % (ref 0.0–7.0)
Eosinophils Absolute: 0.2 10*3/uL (ref 0.0–0.5)
HCT: 38.9 % (ref 38.4–49.9)
HGB: 12.7 g/dL — ABNORMAL LOW (ref 13.0–17.1)
LYMPH%: 26.5 % (ref 14.0–49.0)
MCH: 31.6 pg (ref 27.2–33.4)
MCHC: 32.6 g/dL (ref 32.0–36.0)
MCV: 96.8 fL (ref 79.3–98.0)
MONO#: 0.4 10*3/uL (ref 0.1–0.9)
MONO%: 11.3 % (ref 0.0–14.0)
NEUT%: 56.2 % (ref 39.0–75.0)
NEUTROS ABS: 2.2 10*3/uL (ref 1.5–6.5)
PLATELETS: 98 10*3/uL — AB (ref 140–400)
RBC: 4.02 10*6/uL — AB (ref 4.20–5.82)
RDW: 13.7 % (ref 11.0–14.6)
WBC: 3.9 10*3/uL — AB (ref 4.0–10.3)
lymph#: 1 10*3/uL (ref 0.9–3.3)

## 2014-02-25 LAB — FERRITIN CHCC: Ferritin: 118 ng/ml (ref 22–316)

## 2014-02-26 ENCOUNTER — Ambulatory Visit (HOSPITAL_BASED_OUTPATIENT_CLINIC_OR_DEPARTMENT_OTHER): Payer: Medicaid Other

## 2014-02-26 ENCOUNTER — Ambulatory Visit (HOSPITAL_BASED_OUTPATIENT_CLINIC_OR_DEPARTMENT_OTHER): Payer: Medicaid Other | Admitting: Hematology

## 2014-02-26 ENCOUNTER — Telehealth: Payer: Self-pay | Admitting: Hematology

## 2014-02-26 ENCOUNTER — Telehealth: Payer: Self-pay | Admitting: *Deleted

## 2014-02-26 DIAGNOSIS — K7460 Unspecified cirrhosis of liver: Secondary | ICD-10-CM

## 2014-02-26 DIAGNOSIS — R7401 Elevation of levels of liver transaminase levels: Secondary | ICD-10-CM

## 2014-02-26 DIAGNOSIS — B1920 Unspecified viral hepatitis C without hepatic coma: Secondary | ICD-10-CM

## 2014-02-26 DIAGNOSIS — R740 Nonspecific elevation of levels of transaminase and lactic acid dehydrogenase [LDH]: Secondary | ICD-10-CM

## 2014-02-26 DIAGNOSIS — I8390 Asymptomatic varicose veins of unspecified lower extremity: Secondary | ICD-10-CM

## 2014-02-26 DIAGNOSIS — E119 Type 2 diabetes mellitus without complications: Secondary | ICD-10-CM

## 2014-02-26 NOTE — Patient Instructions (Signed)
Pt to be called every 2 weeks on thursdays to let him know if he needs tp or not.

## 2014-02-26 NOTE — Progress Notes (Signed)
VSS post phlebotomy, dressing CDI on left AC site. Tolerated snack well. Denies distress upon discharge

## 2014-02-26 NOTE — Telephone Encounter (Signed)
Pt confirmed labs/ov per 09/18 POF, sent msg to add ferretin, gave pt AVS..Marland KitchenKJ

## 2014-02-26 NOTE — Telephone Encounter (Signed)
Per staff message and POF I have scheduled appts. Advised scheduler of appts. JMW  

## 2014-02-26 NOTE — Patient Instructions (Signed)

## 2014-02-26 NOTE — Progress Notes (Signed)
**John [name] De-Identified via Obfuscation** [name] HEMATOLOGY OFFICE PROGRESS John [name]  John Garnet., MD [address] [place] [address]  DIAGNOSIS: Hemochromatosis and Hepatitis C  Chief Complaint  Patient presents with  . Follow-up    CURRENT THERAPY: Phlebotomy to maintain a ferritin less than 50 and hold phlebotomy if hemoglobin is less than 10. He started treatment with Ombitas-paritapre-titona-dasab for hepatitis C on 10/29/2013. HCV RNA quantitative not detected as on 12/29/2013 (Care Everywhere in [facility] lab results).     INTERVAL HISTORY:  John John [name] 58 y.o. male with a history of  Hereditary hemochromatosis is here for follow-up and first encounter with me. He was last seen by Dr Rosie Fate on 01/28/2014.   Of John [name] patient also has a history of hepatitis C and brittle Diabetes on insulin. He was managed by Dr. Gardiner Coins from 2008 till presently. He desires to follow locally for his hematology requiring bi-weekly therapeutic phlebotomy. He noted phlebotomy times would be in excess five hours and made long commute times from [place] to [place]. Most of his care including his endocrinology and neurology remains at [facility].   He also goes to [facility]. He has a history of essential tremors and goes to neurologist at [name]. The hemachromatosis was diagnosed 7 years ago. He also has diabetes managed by endocrinology. He denies any recent hypoglycemic episodes. He notes hyperglycemia recently. His diabetes was diagnosed in 2007.   He reports an EGD done 2 years ago with varices reported. He has been on nadolol. His hepatitis C is managed by Dr. Dahlia Client with biannual ultrasound. His HBA1C was 8.4. He reports taking lactulose as needed. He has not needed it in about one year. He was diagnosed with the above when he moved from [place] in 2007. He is adopted and does not know his family history. He lives with his significant other of 13 years. He has four  children. His last hospitalization was during Christmas 2013 for pneumonia. His initial ferritin was more than 2000, in Dec 2014 it was 745 and now it is much better.  Today, he reports that he has some mild fatigue but feels well overall. He is accompanied by his wife. Marland Kitchen  He is seeing hepatologist (Dr. Renae Fickle Schmeltzer) for his hepatitis c with a non-detectable viral load per patient.  Otherwise, he reports feeling well. He states Ribavirin course was for 6 months but insurance called and denying the coverage. He will see haptologist about it and hopefully they can help resolve that issue. We are watching his blood counts and ferritin. The trend is as follows:        MEDICAL HISTORY: Past Medical History  Diagnosis Date  . Diabetes   . High cholesterol   . Anxiety   . Hepatitis C   Hemochromatosis (Iron Overload)  INTERIM HISTORY: has Diabetes; High cholesterol; Anxiety; Hepatitis C; Other hemochromatosis; Tremors of nervous system; and Tic on his problem list.    ALLERGIES:  is allergic to sulfa antibiotics; sulfamethoxazole; and testosterone.  MEDICATIONS: has a current medication list which includes the following prescription(s): gabapentin, glucagon, insulin aspart, insulin glargine, insulin pen needle, ketoconazole, nadolol, ombitas-paritapre-ritona-dasab, polyethylene glycol, prednisolone acetate, and ribavirin.  SURGICAL HISTORY:  Past Surgical History  Procedure Laterality Date  . None      REVIEW OF SYSTEMS:   Constitutional: Denies fevers, chills or abnormal weight loss Eyes: Denies blurriness of vision Ears, nose, mouth, throat, and face: Denies mucositis or sore throat Respiratory: Denies cough,  dyspnea or wheezes Cardiovascular: Denies palpitation, chest discomfort or lower extremity swelling Gastrointestinal:  Denies nausea, heartburn or change in bowel habits Skin: Denies abnormal skin rashes Lymphatics: Denies new lymphadenopathy or easy  bruising Neurological:Denies numbness, tingling or new weaknesses Behavioral/Psych: Mood is stable, no new changes  All other systems were reviewed with the patient and are negative.  PHYSICAL EXAMINATION: ECOG PERFORMANCE STATUS: 0  Blood pressure 114/61, pulse 67, temperature 97.6 F (36.4 C), temperature source Oral, resp. rate 18, height 6' (1.829 m), weight 166 lb 1.6 oz (75.342 kg).  GENERAL:alert, no distress and comfortable; bearded thin male.  SKIN: skin color, texture, turgor are normal, no rashes or significant lesions EYES: normal, Conjunctiva are pink and non-injected, sclera clear OROPHARYNX:no exudate, no erythema and lips, buccal mucosa, and tongue normal  NECK: supple, thyroid normal size, non-tender, without nodularity LYMPH:  no palpable lymphadenopathy in the cervical, axillary or supraclavicular LUNGS: clear to auscultation and percussion with normal breathing effort HEART: regular rate & rhythm and no murmurs and no lower extremity edema ABDOMEN:abdomen soft, non-tender and normal bowel sounds; enlarged spleen about 2 cm below costaphrenic angle. No additional stigmata of liver disease.  Musculoskeletal:no cyanosis of digits and no clubbing  NEURO: alert & oriented x 3 with fluent speech, no focal motor/sensory deficits  LABORATORY DATA: Labs:  Lab Results  Component Value Date   WBC 3.9* 02/25/2014   HGB 12.7* 02/25/2014   HCT 38.9 02/25/2014   MCV 96.8 02/25/2014   PLT 98* 02/25/2014   NEUTROABS 2.2 02/25/2014      Chemistry      Component Value Date/Time   NA 136 01/28/2014 0906   NA 138 04/13/2010 1417   K 3.9 01/28/2014 0906   K 3.2* 04/13/2010 1417   CL 106 04/13/2010 1417   CO2 28 01/28/2014 0906   CO2 26 04/13/2010 1417   BUN 13.7 01/28/2014 0906   BUN 6 04/13/2010 1417   CREATININE 0.9 01/28/2014 0906   CREATININE 0.77 04/13/2010 1417      Component Value Date/Time   CALCIUM 8.8 01/28/2014 0906   CALCIUM 8.6 04/13/2010 1417   ALKPHOS 130 01/28/2014 0906    ALKPHOS 120* 04/13/2010 1417   AST 32 01/28/2014 0906   AST 206* 04/13/2010 1417   ALT 19 01/28/2014 0906   ALT 177* 04/13/2010 1417   BILITOT 1.02 01/28/2014 0906   BILITOT 0.9 04/13/2010 1417     CBC:  Recent Labs Lab 02/25/14 0806  WBC 3.9*  NEUTROABS 2.2  HGB 12.7*  HCT 38.9  MCV 96.8  PLT 98*   Iron/TIBC/Ferritin    Component Value Date/Time   IRON 76 07/14/2013 0841   TIBC 334 07/14/2013 0841   FERRITIN 118 02/25/2014 0806   Studies:  No results found.   RADIOGRAPHIC STUDIES: MR BRAIN WO CONTRAST 09/21/2013  IMPRESSION: Mildly abnormal MRI brain (without) demonstrating: 1. Scattered periventricular, subcortical and juxtacortical foci of non-specific T2 hyperintensities. These findings are non-specific and considerations include autoimmune, inflammatory, post-infectious, microvascular ischemic or migraine associated etiologies. 2. Slightly hyperintense T1 signal in the bilateral globus pallidi. This is non-specific but can be seen in association with liver disease and mineral deposition. 3. No acute findings   ASSESSMENT: John John [name] 58 y.o. male with a history of Hepatitis C and Hemochromatosis.   PLAN:  1. Hereditary hemochromatosis.  --We discuss maintaining his ferritin less than 50. His ferritin is  118 on 02/25/2014.  Patient reports that his needs normal saline with his phlebotomy. We  will continue phlebotomy biweekly prn given he is stable while on  hepatitis C treatment. We hope to maintain a hemoglobin greater than 10 (20% of his initial hemoglobin baseline of 13.7) and hold phlebotomy if it is much lower than that.   He was counseled on symptoms of ortho stasis.   He will start phlebotomy today on 9/18. He requests to always have labs including CBC and ferritin one day prior to consideration of phlebotomy and I ordered that. He will be called for TP if his ferritin is greater than 50.  2. DM2 secondary #1.  --Continue insulin sliding scale and [facility].   3. 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  --Per his report, he has a follow up with GI to discuss treatment. We will follow closely.  --Continue nadolol. He was counseled to report any bleeding.  --His HCV level is non-detected (labs in Care Everywhere) with normalization of his transiminases.  --Worried about insurance coverage for Ribavirin but will discuss with hepatologist.  4. Follow-up.  --Labs including CBC,ferritin biweekly (labs obtained one day prior at patient's request) with phlebotomy if ferritin greater than 50 and hemoglobin greater than 11; RCT in 2 month.     All questions were answered. The patient knows to call the clinic with any problems, questions or concerns. We can certainly see the patient much sooner if necessary.  I spent 15 minutes counseling the patient face to face. The total time spent in the appointment was 25 minutes.    Renaye Rakers, MD 02/26/2014 10:13 AM

## 2014-03-11 ENCOUNTER — Other Ambulatory Visit (HOSPITAL_BASED_OUTPATIENT_CLINIC_OR_DEPARTMENT_OTHER): Payer: Medicaid Other

## 2014-03-11 LAB — CBC WITH DIFFERENTIAL/PLATELET
BASO%: 0.5 % (ref 0.0–2.0)
Basophils Absolute: 0 10*3/uL (ref 0.0–0.1)
EOS%: 3.5 % (ref 0.0–7.0)
Eosinophils Absolute: 0.2 10*3/uL (ref 0.0–0.5)
HCT: 41 % (ref 38.4–49.9)
HGB: 13.1 g/dL (ref 13.0–17.1)
LYMPH%: 29.1 % (ref 14.0–49.0)
MCH: 31.4 pg (ref 27.2–33.4)
MCHC: 32.1 g/dL (ref 32.0–36.0)
MCV: 97.8 fL (ref 79.3–98.0)
MONO#: 0.5 10*3/uL (ref 0.1–0.9)
MONO%: 12.7 % (ref 0.0–14.0)
NEUT%: 54.2 % (ref 39.0–75.0)
NEUTROS ABS: 2.3 10*3/uL (ref 1.5–6.5)
Platelets: 100 10*3/uL — ABNORMAL LOW (ref 140–400)
RBC: 4.19 10*6/uL — AB (ref 4.20–5.82)
RDW: 14.1 % (ref 11.0–14.6)
WBC: 4.3 10*3/uL (ref 4.0–10.3)
lymph#: 1.3 10*3/uL (ref 0.9–3.3)

## 2014-03-11 LAB — FERRITIN CHCC: FERRITIN: 93 ng/mL (ref 22–316)

## 2014-03-12 ENCOUNTER — Encounter: Payer: Self-pay | Admitting: Podiatrist

## 2014-03-12 ENCOUNTER — Ambulatory Visit (INDEPENDENT_AMBULATORY_CARE_PROVIDER_SITE_OTHER): Payer: Medicaid Other | Admitting: Podiatrist

## 2014-03-12 ENCOUNTER — Ambulatory Visit (HOSPITAL_BASED_OUTPATIENT_CLINIC_OR_DEPARTMENT_OTHER): Payer: Medicaid Other

## 2014-03-12 VITALS — BP 112/74 | HR 75 | Resp 17

## 2014-03-12 DIAGNOSIS — M79676 Pain in unspecified toe(s): Secondary | ICD-10-CM

## 2014-03-12 DIAGNOSIS — E1140 Type 2 diabetes mellitus with diabetic neuropathy, unspecified: Secondary | ICD-10-CM

## 2014-03-12 DIAGNOSIS — G588 Other specified mononeuropathies: Secondary | ICD-10-CM

## 2014-03-12 DIAGNOSIS — B351 Tinea unguium: Secondary | ICD-10-CM

## 2014-03-12 DIAGNOSIS — G5760 Lesion of plantar nerve, unspecified lower limb: Secondary | ICD-10-CM

## 2014-03-12 MED ORDER — CICLOPIROX 8 % EX SOLN: Freq: Every day | CUTANEOUS | Status: DC

## 2014-03-12 NOTE — Patient Instructions (Signed)
Morton's Neuroma in Sports  (Interdigital Plantar Neuroma) Morton's neuroma is a condition of the nervous system that results in pain or loss of feeling in the toes. The disease is caused by the bones of the foot squeezing the nerve that runs between two toes (interdigital nerve). The third and fourth toes are most likely to be affected by this disease. SYMPTOMS   Tingling, numbness, burning, or electric shocks in the front of the foot, often involving the third and fourth toes, although it may involve any other pair of toes.  Pain and tenderness in the front of the foot, that gets worse when walking.  Pain that gets worse when pressure is applied to the foot (wearing shoes).  Severe pain in the front of the foot, when standing on the front of the foot (on tiptoes), such as with running, jumping, pivoting, or dancing. CAUSES  Morton's neuroma is caused by swelling of the nerve between two toes. This swelling causes the nerve to be pinched between the bones of the foot. RISK INCREASES WITH:  Recurring foot or ankle injuries.  Poor fitting or worn shoes, with minimal padding and shock absorbers.  Loose ligaments of the foot, causing thickening of the nerve.  Poor foot strength and flexibility. PREVENTION  Warm up and stretch properly before activity.  Maintain physical fitness:  Foot and ankle flexibility.  Muscle strength and endurance.  Cardiovascular fitness.  Wear properly fitted and padded shoes.  Wear arch supports (orthotics), when needed. PROGNOSIS  If treated properly, Morton's neuroma can usually be cured with non-surgical treatment. For certain cases, surgery may be needed. RELATED COMPLICATIONS  Permanent numbness and pain in the foot.  Inability to participate in athletics, because of pain. TREATMENT Treatment first involves stopping any activities that make the symptoms worse. The use of ice and medicine will help reduce pain and inflammation. Wearing shoes  with a wide toe box, and an orthotic arch support or metatarsal bar, may also reduce pain. Your caregiver may give you a corticosteroid injection, to further reduce inflammation. If non-surgical treatment is unsuccessful, surgery may be needed. Surgery to fix Morton's neuroma is often performed as an outpatient procedure, meaning you can go home the same day as the surgery. The procedure involves removing the source of pressure on the nerve. If it is necessary to remove the nerve, you can expect persistent numbness. MEDICATION  If pain medicine is needed, nonsteroidal anti-inflammatory medicines (aspirin and ibuprofen), or other minor pain relievers (acetaminophen), are often advised.  Do not take pain medicine for 7 days before surgery.  Prescription pain relievers are usually prescribed only after surgery. Use only as directed and only as much as you need.  Corticosteroid injections are used in extreme cases, to reduce inflammation. These injections should be done only if necessary, because they may be given only a limited number of times. HEAT AND COLD  Cold treatment (icing) should be applied for 10 to 15 minutes every 2 to 3 hours for inflammation and pain, and immediately after activity that aggravates your symptoms. Use ice packs or an ice massage.  Heat treatment may be used before performing stretching and strengthening activities prescribed by your caregiver, physical therapist, or athletic trainer. Use a heat pack or a warm water soak. SEEK MEDICAL CARE IF:   Symptoms get worse or do not improve in 2 weeks, despite treatment.  After surgery you develop increasing pain, swelling, redness, increased warmth, bleeding, drainage of fluids, or fever.  New, unexplained symptoms develop. (  Drugs used in treatment may produce side effects.) Document Released: 04/04/2005 Document Revised: 08/20/2011 Document Reviewed: 09/09/2008 ExitCare Patient Information 2015 [employer]. This  information is not intended to replace advice given to you by your health care provider. Make sure you discuss any questions you have with your health care provider.  

## 2014-03-12 NOTE — Patient Instructions (Signed)

## 2014-03-12 NOTE — Progress Notes (Signed)
   Subjective:    Patient ID: John [name], male    DOB: 03/28/1956, 58 y.o.   MRN: [id number]  HPI Pt presents with neuroma left foot 2nd and 3rd met, very painful   Review of Systems     Objective:   Physical Exam  GENERAL APPEARANCE: Alert, conversant.  No acute distress.  VASCULAR: Pedal pulses palpable at 2/4 DP and PT bilateral. Capillary refill time is immediate to all digits, Proximal to distal cooling it warm to warm. Digital hair growth is present bilateral  NEUROLOGIC: sensation is intact epicritically and protectively to 5.07 monofilament at 5/5 sites bilateral. Light touch is intact bilateral, vibratory sensation intact bilateral, achilles tendon reflex is intact bilateral. Patient relates sensory neuropathy with numbness and some tingling at the distal portion of bilateral feet. He also relates pain at the first  interspace left greater than right consistent with neuromas.  MUSCULOSKELETAL: acceptable muscle strength, tone and stability bilateral. Intrinsic muscluature intact bilateral. Rectus appearance of foot and digits noted bilateral.  DERMATOLOGIC: Patient's toenails are thickened, discolored, dystrophic, clinically mycotic. No preulcerative lesions are seen, no interdigital maceration noted. No open lesions present.   Pain between first and 2nd metatarsals of left foot consistent with neuroma.        Assessment & Plan:  Neuroma left foot  Plan:  Injected the first interspace of the area of maximal tenderness with 2.5 mg of Kenalog and 0.5% Marcaine plain. He tolerated this well. We had a discussion about alcohol sclerosing injections however it is metabolize solely to the liver and we decided that with his other liver problems to be best to avoid. Debridement of toenails was also carried out at today's visit without complication he'll be seen back as needed for routine care and for neuroma.

## 2014-03-12 NOTE — Progress Notes (Signed)
500 grams removed from patient left AC. Patient tolerated well. Given drinksk pre/post phlebotomy procedure. Will monitor for 30 minutes.

## 2014-03-22 ENCOUNTER — Telehealth: Payer: Self-pay | Admitting: *Deleted

## 2014-03-22 NOTE — Telephone Encounter (Signed)
Called patient no answer and no voicemail set up. Called patient to r/s appt with CM to MM. CM never seen the patient.

## 2014-03-23 NOTE — Telephone Encounter (Signed)
Called patient again but no answer and no voicemail. Moved patient appt from CM to MM. Time is 1:30pm instead of 2pm.

## 2014-03-25 ENCOUNTER — Other Ambulatory Visit (HOSPITAL_BASED_OUTPATIENT_CLINIC_OR_DEPARTMENT_OTHER): Payer: Medicaid Other

## 2014-03-25 LAB — CBC WITH DIFFERENTIAL/PLATELET
BASO%: 0.5 % (ref 0.0–2.0)
Basophils Absolute: 0 10*3/uL (ref 0.0–0.1)
EOS%: 2.9 % (ref 0.0–7.0)
Eosinophils Absolute: 0.1 10*3/uL (ref 0.0–0.5)
HCT: 36.7 % — ABNORMAL LOW (ref 38.4–49.9)
HGB: 11.7 g/dL — ABNORMAL LOW (ref 13.0–17.1)
LYMPH%: 23.7 % (ref 14.0–49.0)
MCH: 30.3 pg (ref 27.2–33.4)
MCHC: 31.9 g/dL — AB (ref 32.0–36.0)
MCV: 94.9 fL (ref 79.3–98.0)
MONO#: 0.4 10*3/uL (ref 0.1–0.9)
MONO%: 9.5 % (ref 0.0–14.0)
NEUT#: 2.4 10*3/uL (ref 1.5–6.5)
NEUT%: 63.4 % (ref 39.0–75.0)
PLATELETS: 84 10*3/uL — AB (ref 140–400)
RBC: 3.87 10*6/uL — ABNORMAL LOW (ref 4.20–5.82)
RDW: 14.2 % (ref 11.0–14.6)
WBC: 3.8 10*3/uL — ABNORMAL LOW (ref 4.0–10.3)
lymph#: 0.9 10*3/uL (ref 0.9–3.3)

## 2014-03-25 LAB — FERRITIN CHCC: Ferritin: 29 ng/ml (ref 22–316)

## 2014-03-30 ENCOUNTER — Ambulatory Visit (INDEPENDENT_AMBULATORY_CARE_PROVIDER_SITE_OTHER): Payer: Medicaid Other | Admitting: Adult Health

## 2014-03-30 ENCOUNTER — Encounter: Payer: Self-pay | Admitting: Adult Health

## 2014-03-30 VITALS — BP 98/57 | HR 74 | Ht 72.0 in | Wt 172.0 lb

## 2014-03-30 DIAGNOSIS — F959 Tic disorder, unspecified: Secondary | ICD-10-CM

## 2014-03-30 MED ORDER — GABAPENTIN 100 MG PO CAPS: ORAL_CAPSULE | ORAL | Status: DC

## 2014-03-30 NOTE — Progress Notes (Signed)
PATIENT: John [name] DOB: 12-Jun-1955  REASON FOR VISIT: follow up HISTORY FROM: patient  HISTORY OF PRESENT ILLNESS: John [name] is a 58 year old male with a history of motor and voice tics. He returns today for follow-up. He was prescribed Neurontin 300 mg TID but could not tolerate that due to sleepiness. He currently is taking 500 mg only at night.  He reports that he does not have any problems with the tics until between 6-8 pm. He will  take the medication then. His significant other notices the movements during the night. The patient doesn't always notice it but it occasionally will wake him up and he will go urinate and go back to bed. He also was diagnosed with Hepatitis C at [name] as well as stage 4 cirrhosis of the liver. He is currently done with  Treatment for the hepatitis C and his latest serum was negative. He he will be rechecked next month.   HISTORY (YY): John [name] is a 58 years old Caucasian male, referred by his primary care physician John [name] for evaluation of episodes of uncontrollable sudden body movement. Initial visit was in January 2015. He had past medical history of diabetes, hyperlipidemia, hemochromatosis, hepatitis C, hepatic cirrhosis in August 2007, he presented with generalized weakness, glucose more than 1000, he was found to have hemochromatosis, stage IV hepatic cirrhosis based on biopsy, evaluation was done at [name] was diagnosed with hepatitis C at the same time, was not sure the source of infection, he had tatto before 1980s. Around that time, he also developed episode of uncontrollable body jerking movement, making sound sometimes, it progressively worsened over the years, During interview, he had multiple episodes of uncontrollable sudden body flexion, making sound, no loss of consciousness, He was previously diagnosed with motor tics, was given prescription of clonazepam, but he rarely tries it, worry about the side effect, clonazepam  would put him into sleep, also worry about the side effect of addiction, liver damage. [place] 05/2013 showed Hepatic cirrhosis. Multiple echogenic nodules within a heterogenous liver parenchyma. Cholelithiasis with tumefactive sludge. Persistent gallbladder wall thickening is likely related to underlying liver disease. He was previously evaluated by neurologists at [name] for similar complains, was given prescription of gabapentin, which does help him some. He also complains of right hip pain, difficulty crossing his legs because of hip pain, radiating pain to his right groin area  UPDATE April 20th 2015:  He is with his girl friend [facility] [name] since 2002 at today's clinical visit. We have reviewed MRI of the brain which showed mild small vessel disease, no acute lesions, no significant basal ganglion signal changes X-ray of right hip was essentially normal, He was seen by John [name] in November 2013, was diagnosed with complex tic disorder, he was getting prescription of clonazepam, which was helping him, but again patient was complains about potential side effect. He has been followed up closely by GI specialist at [name], most recen visit was in April 14th 2015 by John [name] on starting [place]. He was diagnosed with hepatitis C in 2006. He has a liver biopsy then in [name]. He was later seen at [name]. He has never received hepatitis C treatment in part because he was concerned about the possible psychiatric side effects of interferon.He undergoes phlebotomy every 2 weeks since 05/2013; he is seeing an outside hematologist at [name].He does not have hepatic encephalopathy or ascites. He takes lactulose  prn for constipation. He is on nadolol for varices, but has had no bleeding events.He responded very well to Neurontin 300 mg 3 times a day, but complains of tongue swelling with certain generic form of gabapentin     REVIEW OF  SYSTEMS: Full 14 [name] review of systems performed and notable only for:  Constitutional: fatigue Eyes: N/A Ear/Nose/Throat: N/A  Skin: N/A  Cardiovascular: N/A  Respiratory: chest tightness Gastrointestinal: N/A  Genitourinary: frequency of urination Hematology/Lymphatic: N/A  Endocrine: N/A Musculoskeletal:aching muscles, muscle cramps  Allergy/Immunology: N/A  Neurological: headache, numbness, weakness Psychiatric: anxious Sleep: restless leg, frequent waking, daytime sleepiness   ALLERGIES: Allergies  Allergen Reactions  . Sulfa Antibiotics Swelling    Of tongue  . Sulfamethoxazole Swelling  . Testosterone Rash and Dermatitis    HOME MEDICATIONS: Outpatient Prescriptions Prior to Visit  Medication Sig Dispense Refill  . ciclopirox (PENLAC) 8 % solution Apply topically at bedtime.  6.6 mL  2  . gabapentin (NEURONTIN) 100 MG capsule Take 3 capsules (300 mg total) by mouth 3 (three) times daily.  270 capsule  12  . glucagon 1 MG injection Inject 1 mg into the vein once as needed.      . insulin aspart (NOVOLOG) 100 UNIT/ML injection Inject into the skin 3 (three) times daily before meals. Sliding scale      . insulin glargine (LANTUS) 100 UNIT/ML injection Inject 50 Units into the skin daily.       . Insulin Pen Needle (B-D ULTRAFINE III SHORT PEN) 31G X 8 MM MISC Injects 4 times daily      . ketoconazole (NIZORAL) 2 % cream Apply 1 application topically daily.  60 g  2  . nadolol (CORGARD) 40 MG tablet Take 40 mg by mouth daily.      . polyethylene glycol (MIRALAX / GLYCOLAX) packet Take 17 g by mouth daily.      . Ombitas-Paritapre-Ritona-Dasab (VIEKIRA PAK) 12.5-75-50 &250 MG TBPK Take with a meal about same times each day: 2 pink tabs AND 1 beige tab every morning, 1 beige tab every evening      . ribavirin (COPEGUS) 200 MG tablet Take 3 tablets po qam and 2 tablets po qpm       No facility-administered medications prior to visit.    PAST MEDICAL HISTORY: Past  Medical History  Diagnosis Date  . Diabetes   . High cholesterol   . Anxiety   . Hepatitis C     PAST SURGICAL HISTORY: Past Surgical History  Procedure Laterality Date  . None      FAMILY HISTORY: History reviewed. No pertinent family history.  SOCIAL HISTORY: History   Social History  . Marital Status: Single    Spouse Name: N/A    Number of Children: 4  . Years of Education: 14   Occupational History  .      disabled   Social History Main Topics  . Smoking status: Never Smoker   . Smokeless tobacco: Never Used  . Alcohol Use: No     Comment: Quit 2007  . Drug Use: No     Comment: Quit 1980  . Sexual Activity: Not on file   Other Topics Concern  . Not on file   Social History Narrative   Patient lives at home with his girlfriend.    Patient is disabled.   Right handed.   Caffeine- 1-2 cups of caffeine daily.   Education- two years of college.  PHYSICAL EXAM  Filed Vitals:   03/30/14 1352  BP: 98/57  Pulse: 74  Height: 6' (1.829 m)  Weight: 172 lb (78.019 kg)   Body mass index is 23.32 kg/(m^2).  Generalized: Well developed, in no acute distress   Neurological examination  Mentation: Alert oriented to time, place, history taking. Follows all commands speech and language fluent Cranial nerve II-XII: Pupils were equal round reactive to light. Extraocular movements were full, visual field were full on confrontational test. Facial sensation and strength were normal.  Uvula tongue midline. Head turning and shoulder shrug  were normal and symmetric. Motor: The motor testing reveals 5 over 5 strength of all 4 extremities. Good symmetric motor tone is noted throughout.  Sensory: Sensory testing is intact to soft touch on all 4 extremities. No evidence of extinction is noted.  Coordination: Cerebellar testing reveals good finger-nose-finger and heel-to-shin bilaterally.  Gait and station: Gait is slightly wide-based. Tandem gait is normal.  Romberg is negative. No drift is seen.  Reflexes: Deep tendon reflexes are symmetric and normal bilaterally.    DIAGNOSTIC DATA (LABS, IMAGING, TESTING) - I reviewed patient records, labs, notes, testing and imaging myself where available.  Lab Results  Component Value Date   WBC 3.8* 03/25/2014   HGB 11.7* 03/25/2014   HCT 36.7* 03/25/2014   MCV 94.9 03/25/2014   PLT 84* 03/25/2014      Component Value Date/Time   NA 136 01/28/2014 0906   NA 138 04/13/2010 1417   K 3.9 01/28/2014 0906   K 3.2* 04/13/2010 1417   CL 106 04/13/2010 1417   CO2 28 01/28/2014 0906   CO2 26 04/13/2010 1417   GLUCOSE 215* 01/28/2014 0906   GLUCOSE 82 04/13/2010 1417   BUN 13.7 01/28/2014 0906   BUN 6 04/13/2010 1417   CREATININE 0.9 01/28/2014 0906   CREATININE 0.77 04/13/2010 1417   CALCIUM 8.8 01/28/2014 0906   CALCIUM 8.6 04/13/2010 1417   PROT 6.7 01/28/2014 0906   PROT 7.9 04/13/2010 1417   ALBUMIN 2.3* 01/28/2014 0906   ALBUMIN 2.8* 04/13/2010 1417   AST 32 01/28/2014 0906   AST 206* 04/13/2010 1417   ALT 19 01/28/2014 0906   ALT 177* 04/13/2010 1417   ALKPHOS 130 01/28/2014 0906   ALKPHOS 120* 04/13/2010 1417   BILITOT 1.02 01/28/2014 0906   BILITOT 0.9 04/13/2010 1417   GFRNONAA >60 04/13/2010 1417   GFRAA  Value: >60        The eGFR has been calculated using the MDRD equation. This calculation has not been validated in all clinical situations. eGFR's persistently <60 mL/min signify possible Chronic Kidney Disease. 04/13/2010 1417       ASSESSMENT AND PLAN 58 y.o. year old male  has a past medical history of Diabetes; High cholesterol; Anxiety; and Hepatitis C. here with:  1. Motor tics  Patient will continue taking the Neurontin. I advised him to try taking 1 tablet 100 mg at 6:00PM when the tic begins and then take 4 tablets before bed. The patient is opposed to trying benzodiazepines stating that he does not want to try anything addictive. If his movements during his sleep become bothersome Requip  could be tried in the future depending on liver function. Patient will follow-up in 6 months or sooner if needed.   Ward Givens, MSN, NP-C 03/30/2014, 1:54 PM [facility] [address], [place],  [place] [address] [phone]  Note: This document was prepared with digital dictation and possible smart phrase  technology. Any transcriptional errors that result from this process are unintentional.

## 2014-03-30 NOTE — Patient Instructions (Signed)
Gabapentin capsules or tablets What is this medicine? GABAPENTIN (GA ba pen tin) is used to control partial seizures in adults with epilepsy. It is also used to treat certain types of nerve pain. This medicine may be used for other purposes; ask your health care provider or pharmacist if you have questions. COMMON BRAND NAME(S): Gabarone, Neurontin What should I tell my health care provider before I take this medicine? They need to know if you have any of these conditions: -kidney disease -suicidal thoughts, plans, or attempt; a previous suicide attempt by you or a family member -an unusual or allergic reaction to gabapentin, other medicines, foods, dyes, or preservatives -pregnant or trying to get pregnant -breast-feeding How should I use this medicine? Take this medicine by mouth with a glass of water. Follow the directions on the prescription label. You can take it with or without food. If it upsets your stomach, take it with food.Take your medicine at regular intervals. Do not take it more often than directed. Do not stop taking except on your doctor's advice. If you are directed to break the 600 or 800 mg tablets in half as part of your dose, the extra half tablet should be used for the next dose. If you have not used the extra half tablet within 28 days, it should be thrown away. A special MedGuide will be given to you by the pharmacist with each prescription and refill. Be sure to read this information carefully each time. Talk to your pediatrician regarding the use of this medicine in children. Special care may be needed. Overdosage: If you think you have taken too much of this medicine contact a poison control center or emergency room at once. NOTE: This medicine is only for you. Do not share this medicine with others. What if I miss a dose? If you miss a dose, take it as soon as you can. If it is almost time for your next dose, take only that dose. Do not take double or extra  doses. What may interact with this medicine? Do not take this medicine with any of the following medications: -other gabapentin products This medicine may also interact with the following medications: -alcohol -antacids -antihistamines for allergy, cough and cold -certain medicines for anxiety or sleep -certain medicines for depression or psychotic disturbances -homatropine; hydrocodone -naproxen -narcotic medicines (opiates) for pain -phenothiazines like chlorpromazine, mesoridazine, prochlorperazine, thioridazine This list may not describe all possible interactions. Give your health care provider a list of all the medicines, herbs, non-prescription drugs, or dietary supplements you use. Also tell them if you smoke, drink alcohol, or use illegal drugs. Some items may interact with your medicine. What should I watch for while using this medicine? Visit your doctor or health care professional for regular checks on your progress. You may want to keep a record at home of how you feel your condition is responding to treatment. You may want to share this information with your doctor or health care professional at each visit. You should contact your doctor or health care professional if your seizures get worse or if you have any new types of seizures. Do not stop taking this medicine or any of your seizure medicines unless instructed by your doctor or health care professional. Stopping your medicine suddenly can increase your seizures or their severity. Wear a medical identification bracelet or chain if you are taking this medicine for seizures, and carry a card that lists all your medications. You may get drowsy, dizzy, or have blurred   vision. Do not drive, use machinery, or do anything that needs mental alertness until you know how this medicine affects you. To reduce dizzy or fainting spells, do not sit or stand up quickly, especially if you are an older patient. Alcohol can increase drowsiness and  dizziness. Avoid alcoholic drinks. Your mouth may get dry. Chewing sugarless gum or sucking hard candy, and drinking plenty of water will help. The use of this medicine may increase the chance of suicidal thoughts or actions. Pay special attention to how you are responding while on this medicine. Any worsening of mood, or thoughts of suicide or dying should be reported to your health care professional right away. Women who become pregnant while using this medicine may enroll in the [place] American Antiepileptic Drug Pregnancy Registry by calling 1-888-233-2334. This registry collects information about the safety of antiepileptic drug use during pregnancy. What side effects may I notice from receiving this medicine? Side effects that you should report to your doctor or health care professional as soon as possible: -allergic reactions like skin rash, itching or hives, swelling of the face, lips, or tongue -worsening of mood, thoughts or actions of suicide or dying Side effects that usually do not require medical attention (report to your doctor or health care professional if they continue or are bothersome): -constipation -difficulty walking or controlling muscle movements -dizziness -nausea -slurred speech -tiredness -tremors -weight gain This list may not describe all possible side effects. Call your doctor for medical advice about side effects. You may report side effects to FDA at 1-800-FDA-1088. Where should I keep my medicine? Keep out of reach of children. Store at room temperature between 15 and 30 degrees C (59 and 86 degrees F). Throw away any unused medicine after the expiration date. NOTE: This sheet is a summary. It may not cover all possible information. If you have questions about this medicine, talk to your doctor, pharmacist, or health care provider.  2015, [employer]vier/[employer] Standard. (2013-01-29 09:12:48)  

## 2014-04-08 ENCOUNTER — Telehealth: Payer: Self-pay

## 2014-04-08 ENCOUNTER — Other Ambulatory Visit (HOSPITAL_BASED_OUTPATIENT_CLINIC_OR_DEPARTMENT_OTHER): Payer: Medicaid Other

## 2014-04-08 LAB — CBC WITH DIFFERENTIAL/PLATELET
BASO%: 0.2 % (ref 0.0–2.0)
Basophils Absolute: 0 10*3/uL (ref 0.0–0.1)
EOS%: 3.7 % (ref 0.0–7.0)
Eosinophils Absolute: 0.2 10*3/uL (ref 0.0–0.5)
HCT: 37.4 % — ABNORMAL LOW (ref 38.4–49.9)
HGB: 12.2 g/dL — ABNORMAL LOW (ref 13.0–17.1)
LYMPH%: 29.9 % (ref 14.0–49.0)
MCH: 28.8 pg (ref 27.2–33.4)
MCHC: 32.6 g/dL (ref 32.0–36.0)
MCV: 88.2 fL (ref 79.3–98.0)
MONO#: 0.6 10*3/uL (ref 0.1–0.9)
MONO%: 12.1 % (ref 0.0–14.0)
NEUT#: 2.5 10*3/uL (ref 1.5–6.5)
NEUT%: 54.1 % (ref 39.0–75.0)
PLATELETS: 99 10*3/uL — AB (ref 140–400)
RBC: 4.24 10*6/uL (ref 4.20–5.82)
RDW: 14.6 % (ref 11.0–14.6)
WBC: 4.6 10*3/uL (ref 4.0–10.3)
lymph#: 1.4 10*3/uL (ref 0.9–3.3)

## 2014-04-08 LAB — FERRITIN CHCC: Ferritin: 24 ng/ml (ref 22–316)

## 2014-04-08 NOTE — Telephone Encounter (Signed)
Pt returned call. He does not need phlebotomy tomorrow. Ferritin is 24

## 2014-04-22 ENCOUNTER — Ambulatory Visit (HOSPITAL_BASED_OUTPATIENT_CLINIC_OR_DEPARTMENT_OTHER): Payer: Medicaid Other | Admitting: Hematology

## 2014-04-22 ENCOUNTER — Telehealth: Payer: Self-pay | Admitting: Hematology

## 2014-04-22 ENCOUNTER — Other Ambulatory Visit (HOSPITAL_BASED_OUTPATIENT_CLINIC_OR_DEPARTMENT_OTHER): Payer: Medicaid Other

## 2014-04-22 ENCOUNTER — Telehealth: Payer: Self-pay

## 2014-04-22 ENCOUNTER — Telehealth: Payer: Self-pay | Admitting: *Deleted

## 2014-04-22 DIAGNOSIS — R5383 Other fatigue: Secondary | ICD-10-CM

## 2014-04-22 DIAGNOSIS — B1920 Unspecified viral hepatitis C without hepatic coma: Secondary | ICD-10-CM

## 2014-04-22 DIAGNOSIS — E119 Type 2 diabetes mellitus without complications: Secondary | ICD-10-CM

## 2014-04-22 LAB — CBC WITH DIFFERENTIAL/PLATELET
BASO%: 0.8 % (ref 0.0–2.0)
Basophils Absolute: 0 10*3/uL (ref 0.0–0.1)
EOS%: 4.7 % (ref 0.0–7.0)
Eosinophils Absolute: 0.2 10*3/uL (ref 0.0–0.5)
HCT: 36.2 % — ABNORMAL LOW (ref 38.4–49.9)
HGB: 11.5 g/dL — ABNORMAL LOW (ref 13.0–17.1)
LYMPH#: 1.3 10*3/uL (ref 0.9–3.3)
LYMPH%: 30.3 % (ref 14.0–49.0)
MCH: 27.6 pg (ref 27.2–33.4)
MCHC: 31.7 g/dL — AB (ref 32.0–36.0)
MCV: 87.1 fL (ref 79.3–98.0)
MONO#: 0.4 10*3/uL (ref 0.1–0.9)
MONO%: 10.1 % (ref 0.0–14.0)
NEUT#: 2.3 10*3/uL (ref 1.5–6.5)
NEUT%: 54.1 % (ref 39.0–75.0)
Platelets: 102 10*3/uL — ABNORMAL LOW (ref 140–400)
RBC: 4.15 10*6/uL — AB (ref 4.20–5.82)
RDW: 16.4 % — ABNORMAL HIGH (ref 11.0–14.6)
WBC: 4.3 10*3/uL (ref 4.0–10.3)

## 2014-04-22 LAB — FERRITIN CHCC: Ferritin: 21 ng/ml — ABNORMAL LOW (ref 22–316)

## 2014-04-22 NOTE — Progress Notes (Signed)
[facility] HEMATOLOGY OFFICE PROGRESS NOTE  Alva GarnetSHELTON,KIMBERLY R., MD [address] [place] [id number]  DIAGNOSIS: Hemochromatosis and Hepatitis C  Chief Complaint  Patient presents with  . Follow-up    CURRENT THERAPY: Phlebotomy to maintain a ferritin less than 50 and hold phlebotomy if hemoglobin is less than 10. He started treatment with [place]-paritapre-titona-dasab for hepatitis C on 10/29/2013. HCV RNA quantitative not detected as on 12/29/2013 ([facility]).     INTERVAL HISTORY:  John [name] 58 y.o. male with a history of  Hereditary hemochromatosis is here for follow-up and last seen by me on 02/26/2014.   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 He was managed by Dr. Gardiner CoinsJohn Owen from 2008 till presently. He desires to follow locally for his hematology requiring bi-weekly therapeutic phlebotomy. He noted phlebotomy times would be in excess five hours and [place] [facility] [place]. Most of his care including his endocrinology and neurology [facility].   [facility] also [facility]. He has a history of essential tremors and [facility]. The hemachromatosis was diagnosed 7 years ago. He also has diabetes managed by endocrinology. He denies any recent hypoglycemic episodes. He notes hyperglycemia recently. His diabetes was diagnosed in 2007.   He reports an EGD done 2 years ago with varices reported. He has been on nadolol. His hepatitis C is managed by Dr. Dahlia ClientBrussan with biannual ultrasound. His HBA1C was 8.4. He reports taking lactulose as needed. He has not needed it in about one year. He was diagnosed with the above when [place] moved from [place] in [place]. He is adopted and does not know his family history. He lives with his significant other of 13 years. He has four children. His last hospitalization was  during Christmas 2013 for pneumonia. His initial ferritin was more than 2000, in Dec 2014 it was 745 and now it is much better.  Today, he reports that he has some mild fatigue but feels well overall. He is accompanied by his wife. Marland Kitchen.  He is seeing hepatologist (Dr. Renae FicklePaul Schmeltzer) for his hepatitis c with a non-detectable viral load per patient.  Otherwise, he reports feeling well. The trend of his counts is as follows:         MEDICAL HISTORY: Past Medical History  Diagnosis Date  . Diabetes   . High cholesterol   . Anxiety   . Hepatitis C   Hemochromatosis (Iron Overload)  INTERIM HISTORY: has Diabetes; High cholesterol; Anxiety; Hepatitis C; Other hemochromatosis; Tremors of nervous system; and Tic on his problem list.    ALLERGIES:  is allergic to sulfa antibiotics; sulfamethoxazole; and testosterone.  MEDICATIONS: has a current medication list which includes the following prescription(s): ciclopirox, gabapentin, glucagon, insulin aspart, insulin glargine, insulin pen needle, ketoconazole, nadolol, and polyethylene glycol.  SURGICAL HISTORY:  Past Surgical History  Procedure Laterality Date  . None      REVIEW OF SYSTEMS:   Constitutional: Denies fevers, chills or abnormal weight loss Eyes: Denies blurriness of vision Ears, nose, mouth, throat, and face: Denies mucositis or sore throat Respiratory: Denies cough, dyspnea or wheezes Cardiovascular: Denies palpitation, chest discomfort or lower extremity swelling Gastrointestinal:  Denies nausea, heartburn or change in bowel habits Skin: Denies abnormal skin rashes Lymphatics: Denies new lymphadenopathy or easy bruising Neurological:Denies numbness, tingling or new weaknesses Behavioral/Psych: Mood is  stable, no new changes  All other systems were reviewed with the patient and are negative.  PHYSICAL EXAMINATION: ECOG PERFORMANCE STATUS: 0  Blood pressure 118/65, pulse 65, temperature 98.1 F (36.7 C), temperature  source Oral, resp. rate 18, height 6' (1.829 m), weight 169 lb (76.658 kg), SpO2 99 %.  GENERAL:alert, no distress and comfortable; bearded thin male.  SKIN: skin color, texture, turgor are normal, no rashes or significant lesions EYES: normal, Conjunctiva are pink and non-injected, sclera clear OROPHARYNX:no exudate, no erythema and lips, buccal mucosa, and tongue normal  NECK: supple, thyroid normal size, non-tender, without nodularity LYMPH:  no palpable lymphadenopathy in the cervical, axillary or supraclavicular LUNGS: clear to auscultation and percussion with normal breathing effort HEART: regular rate & rhythm and no murmurs and no lower extremity edema ABDOMEN:abdomen soft, non-tender and normal bowel sounds; enlarged spleen about 2 cm below costaphrenic angle. No additional stigmata of liver disease.  Musculoskeletal:no cyanosis of digits and no clubbing  NEURO: alert & oriented x 3 with fluent speech, no focal motor/sensory deficits  LABORATORY DATA: Labs:  Lab Results  Component Value Date   WBC 4.3 04/22/2014   HGB 11.5* 04/22/2014   HCT 36.2* 04/22/2014   MCV 87.1 04/22/2014   PLT 102* 04/22/2014   NEUTROABS 2.3 04/22/2014      Chemistry      Component Value Date/Time   NA 136 01/28/2014 0906   NA 138 04/13/2010 1417   K 3.9 01/28/2014 0906   K 3.2* 04/13/2010 1417   CL 106 04/13/2010 1417   CO2 28 01/28/2014 0906   CO2 26 04/13/2010 1417   BUN 13.7 01/28/2014 0906   BUN 6 04/13/2010 1417   CREATININE 0.9 01/28/2014 0906   CREATININE 0.77 04/13/2010 1417      Component Value Date/Time   CALCIUM 8.8 01/28/2014 0906   CALCIUM 8.6 04/13/2010 1417   ALKPHOS 130 01/28/2014 0906   ALKPHOS 120* 04/13/2010 1417   AST 32 01/28/2014 0906   AST 206* 04/13/2010 1417   ALT 19 01/28/2014 0906   ALT 177* 04/13/2010 1417   BILITOT 1.02 01/28/2014 0906   BILITOT 0.9 04/13/2010 1417     CBC:  Recent Labs Lab 04/22/14 0946  WBC 4.3  NEUTROABS 2.3  HGB 11.5*    HCT 36.2*  MCV 87.1  PLT 102*   Iron/TIBC/Ferritin    Component Value Date/Time   IRON 76 07/14/2013 0841   TIBC 334 07/14/2013 0841   FERRITIN 24 04/08/2014 0914   Studies:  No results found.   RADIOGRAPHIC STUDIES: MR BRAIN WO CONTRAST 09/21/2013  IMPRESSION: Mildly abnormal MRI brain (without) demonstrating: 1. Scattered periventricular, subcortical and juxtacortical foci of non-specific T2 hyperintensities. These findings are non-specific and considerations include autoimmune, inflammatory, post-infectious, microvascular ischemic or migraine associated etiologies. 2. Slightly hyperintense T1 signal in the bilateral globus pallidi. This is non-specific but can be seen in association with liver disease and mineral deposition. 3. No acute findings   ASSESSMENT: John [name] 58 y.o. male with a history of Hepatitis C and Hemochromatosis.   PLAN:  1. Hereditary hemochromatosis.  --We discuss maintaining his ferritin less than 50. His ferritin last two visits was below 50 and today was 21. We will check his CBC/Ferritin next week and then every 3 weeks x 3 months. Then we can determine if we can back off or space phlebotomies more.  -- Patient reports that his needs normal saline with his phlebotomy. We hope to maintain a hemoglobin greater than  10 (20% of his initial hemoglobin baseline of 13.7) and hold phlebotomy if it is much lower than that.   He was counseled on symptoms of orthostasis.   He requests to always have labs including CBC and ferritin one day prior to consideration of phlebotomy and I ordered that.   2. DM2 secondary #1.  --Continue insulin sliding scale and [facility].   3. 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  --Per his report, he has a follow up with GI to discuss treatment. We will follow closely.  --Continue nadolol. He was counseled to report any bleeding.  --His HCV level is  non-detected (labs in Care Everywhere) with normalization of his transiminases.   4. Follow-up.  --Labs including CBC,ferritin every 3 weeks starting next week (labs obtained one day prior at patient's request) with phlebotomy if ferritin greater than 50 and hemoglobin greater than 10; RTC in 3 month.   Flu shot next week.  All questions were answered. The patient knows to call the clinic with any problems, questions or concerns. We can certainly see the patient much sooner if necessary.  I spent 15 minutes counseling the patient face to face. The total time spent in the appointment was 25 minutes.    Cay SchillingsAasim Abas Leicht, MD Medical Hematologist/Oncologist [facility] Pager: [phone] Office No: [phone]

## 2014-04-22 NOTE — Telephone Encounter (Signed)
S/w pt about not needing phlebotomy tomorrow b/c ferritin is 21. Pt asked for injection appt for flu shot on 11/19 when he comes in for repeat labs. POF to scheduler.

## 2014-04-22 NOTE — Telephone Encounter (Signed)
Per staff message and POF I have scheduled appts. Advised scheduler of appts. JMW  

## 2014-04-22 NOTE — Telephone Encounter (Signed)
Gave avs & cal for Nov-Feb. Sent mess to sch Phle.

## 2014-04-23 ENCOUNTER — Telehealth: Payer: Self-pay | Admitting: *Deleted

## 2014-04-23 NOTE — Telephone Encounter (Signed)
Per staff message and POF I have scheduled appts. Advised scheduler of appts. JMW  

## 2014-04-29 ENCOUNTER — Other Ambulatory Visit (HOSPITAL_BASED_OUTPATIENT_CLINIC_OR_DEPARTMENT_OTHER): Payer: Medicaid Other

## 2014-04-29 ENCOUNTER — Ambulatory Visit (HOSPITAL_BASED_OUTPATIENT_CLINIC_OR_DEPARTMENT_OTHER): Payer: Medicaid Other

## 2014-04-29 DIAGNOSIS — Z23 Encounter for immunization: Secondary | ICD-10-CM

## 2014-04-29 LAB — CBC WITH DIFFERENTIAL/PLATELET
BASO%: 0.2 % (ref 0.0–2.0)
Basophils Absolute: 0 10*3/uL (ref 0.0–0.1)
EOS%: 3.7 % (ref 0.0–7.0)
Eosinophils Absolute: 0.2 10*3/uL (ref 0.0–0.5)
HCT: 39 % (ref 38.4–49.9)
HGB: 12.8 g/dL — ABNORMAL LOW (ref 13.0–17.1)
LYMPH%: 35.8 % (ref 14.0–49.0)
MCH: 27.6 pg (ref 27.2–33.4)
MCHC: 32.8 g/dL (ref 32.0–36.0)
MCV: 84.1 fL (ref 79.3–98.0)
MONO#: 0.5 10*3/uL (ref 0.1–0.9)
MONO%: 10 % (ref 0.0–14.0)
NEUT#: 2.4 10*3/uL (ref 1.5–6.5)
NEUT%: 50.3 % (ref 39.0–75.0)
PLATELETS: 117 10*3/uL — AB (ref 140–400)
RBC: 4.64 10*6/uL (ref 4.20–5.82)
RDW: 15.5 % — ABNORMAL HIGH (ref 11.0–14.6)
WBC: 4.8 10*3/uL (ref 4.0–10.3)
lymph#: 1.7 10*3/uL (ref 0.9–3.3)

## 2014-04-29 LAB — FERRITIN CHCC: FERRITIN: 28 ng/mL (ref 22–316)

## 2014-04-29 MED ORDER — INFLUENZA VAC SPLIT QUAD 0.5 ML IM SUSY
0.5000 mL | PREFILLED_SYRINGE | Freq: Once | INTRAMUSCULAR | Status: AC
  Administered 2014-04-29: 0.5 mL via INTRAMUSCULAR
  Filled 2014-04-29: qty 0.5

## 2014-04-29 NOTE — Patient Instructions (Signed)
H1N1 Influenza (swine flu) Vaccine injection What is this medicine? H1N1 INFLUENZA (SWINE FLU) VACCINE (H1N1 in floo EN zuh (swahyn floo) vak SEEN) is a vaccine to protect from an infection with the pandemic H1N1 flu, also known as the swine flu. The vaccine only helps protect you against this one strain of the flu. This vaccine does not help to the reduce the risk of getting other types of flu. You may also need to get the seasonal influenza virus vaccine. This medicine may be used for other purposes; ask your health care provider or pharmacist if you have questions. COMMON BRAND NAME(S): Influenza A (H1N1) 2009 Monovalent Vaccine What should I tell my health care provider before I take this medicine? They need to know if you have any of these conditions: -Guillain-Barre syndrome -immune system problems -an unusual or allergic reaction to influenza vaccine, eggs, neomycin, polymyxin, other medicines, foods, dyes or preservatives -pregnant or trying to get pregnant -breast-feeding How should I use this medicine? This vaccine is for injection into a muscle. It is given by a health care professional. A copy of Vaccine Information Statements will be given before each vaccination. Read this sheet carefully each time. The sheet may change frequently. Talk to your pediatrician regarding the use of this medicine in children. Special care may be needed. While this drug may be prescribed for children as young as 6 months for selected conditions, precautions do apply. Overdosage: If you think you've taken too much of this medicine contact a poison control center or emergency room at once. Overdosage: If you think you have taken too much of this medicine contact a poison control center or emergency room at once. NOTE: This medicine is only for you. Do not share this medicine with others. What if I miss a dose? If needed, keep appointments for follow-up (booster) doses as directed. It is important not to  miss your dose. Call your doctor or health care professional if you are unable to keep an appointment. What may interact with this medicine? -anakinra -medicines for organ transplant -medicines to treat cancer -other vaccines -rilonacept -steroid medicines like prednisone or cortisone -tumor necrosis factor (TNF) modifiers like adalimumab, etanercept, infliximab, golimumab, or certolizumab This list may not describe all possible interactions. Give your health care provider a list of all the medicines, herbs, non-prescription drugs, or dietary supplements you use. Also tell them if you smoke, drink alcohol, or use illegal drugs. Some items may interact with your medicine. What should I watch for while using this medicine? Report any side effects to your doctor right away. This vaccine lowers your risk of getting the pandemic H1N1 flu. You can get a milder H1N1 flu infection if you are around others with this flu. This flu vaccine will not protect against colds or other illnesses including other flu viruses. You may also need the seasonal influenza vaccine. What side effects may I notice from receiving this medicine? Side effects that you should report to your doctor or health care professional as soon as possible: -allergic reactions like skin rash, itching or hives, swelling of the face, lips, or tongue -breathing problems -muscle weakness -unusual drooping or paralysis of face Side effects that usually do not require medical attention (Report these to your doctor or health care professional if they continue or are bothersome.): -chills -cough -headache -muscle aches and pains -runny or stuffy nose -sore throat -stomach upset -tiredness This list may not describe all possible side effects. Call your doctor for medical advice about   side effects. You may report side effects to FDA at 1-800-FDA-1088. Where should I keep my medicine? This vaccine is only given in a clinic, pharmacy,  doctor's office, or other health care setting and will not be stored at home. NOTE: This sheet is a summary. It may not cover all possible information. If you have questions about this medicine, talk to your doctor, pharmacist, or health care provider.  2015, Elsevier/Gold Standard. (2008-04-27 16:49:51)  

## 2014-04-30 ENCOUNTER — Telehealth: Payer: Self-pay | Admitting: *Deleted

## 2014-04-30 NOTE — Telephone Encounter (Signed)
Patient called and was going to cancel his appt for today if he couldn't find his lab results. Ferrtin below 50, pt doesn't need a phlebotomy. I verified with a chemo nurse. Desk RN notified

## 2014-05-07 ENCOUNTER — Telehealth: Payer: Self-pay | Admitting: Hematology

## 2014-05-07 NOTE — Telephone Encounter (Signed)
due to early closing moved 12/24 appt to earlier time. also moved 07/15/14 f/u from CP1 to YF. lmonvm informing pt and mailed schedule. all other appts remain the same.

## 2014-05-12 ENCOUNTER — Other Ambulatory Visit: Payer: Self-pay | Admitting: *Deleted

## 2014-05-13 ENCOUNTER — Other Ambulatory Visit (HOSPITAL_BASED_OUTPATIENT_CLINIC_OR_DEPARTMENT_OTHER): Payer: Medicaid Other

## 2014-05-13 LAB — COMPREHENSIVE METABOLIC PANEL (CC13)
ALT: 41 U/L (ref 0–55)
AST: 52 U/L — ABNORMAL HIGH (ref 5–34)
Albumin: 2.8 g/dL — ABNORMAL LOW (ref 3.5–5.0)
Alkaline Phosphatase: 142 U/L (ref 40–150)
Anion Gap: 7 meq/L (ref 3–11)
BUN: 10.8 mg/dL (ref 7.0–26.0)
CO2: 29 meq/L (ref 22–29)
Calcium: 9.1 mg/dL (ref 8.4–10.4)
Chloride: 102 meq/L (ref 98–109)
Creatinine: 0.8 mg/dL (ref 0.7–1.3)
EGFR: >90 ml/min/1.73 m2 (ref >90)
Glucose: 177 mg/dL — ABNORMAL HIGH (ref 70–140)
Potassium: 4 meq/L (ref 3.5–5.1)
Sodium: 138 meq/L (ref 136–145)
Total Bilirubin: 0.41 mg/dL (ref 0.20–1.20)
Total Protein: 7.5 g/dL (ref 6.4–8.3)

## 2014-05-13 LAB — CBC WITH DIFFERENTIAL/PLATELET
BASO%: 0.5 % (ref 0.0–2.0)
Basophils Absolute: 0 10*3/uL (ref 0.0–0.1)
EOS ABS: 0.2 10*3/uL (ref 0.0–0.5)
EOS%: 4.6 % (ref 0.0–7.0)
HEMATOCRIT: 40.3 % (ref 38.4–49.9)
HGB: 12.8 g/dL — ABNORMAL LOW (ref 13.0–17.1)
LYMPH#: 1.3 10*3/uL (ref 0.9–3.3)
LYMPH%: 28.7 % (ref 14.0–49.0)
MCH: 26.9 pg — AB (ref 27.2–33.4)
MCHC: 31.8 g/dL — ABNORMAL LOW (ref 32.0–36.0)
MCV: 84.4 fL (ref 79.3–98.0)
MONO#: 0.5 10*3/uL (ref 0.1–0.9)
MONO%: 11.2 % (ref 0.0–14.0)
NEUT#: 2.5 10*3/uL (ref 1.5–6.5)
NEUT%: 55 % (ref 39.0–75.0)
Platelets: 109 10*3/uL — ABNORMAL LOW (ref 140–400)
RBC: 4.77 10*6/uL (ref 4.20–5.82)
RDW: 17.9 % — ABNORMAL HIGH (ref 11.0–14.6)
WBC: 4.6 10*3/uL (ref 4.0–10.3)

## 2014-05-13 LAB — FERRITIN CHCC: FERRITIN: 24 ng/mL (ref 22–316)

## 2014-05-14 ENCOUNTER — Telehealth: Payer: Self-pay | Admitting: Hematology

## 2014-05-14 NOTE — Telephone Encounter (Signed)
returned pt call and moved 12/24 phleb to 12/21 per pt. pt aware of time between lab/phleb. pt will have lab @ 8:30am and phleb @ 11:30am. also time of 2/5 phleb changed per pt and pt is aware of new time. messages to desk nurse re calling pt w/lab levels today and if he needs to keep phleb for today. also pt wants to come in earlier today - s/w charge nurse no cancellations at this time but pt may come in at 1pm as he requested to see if he may be taken earlier. pt aware.

## 2014-05-28 ENCOUNTER — Other Ambulatory Visit: Payer: Self-pay | Admitting: *Deleted

## 2014-05-31 ENCOUNTER — Telehealth: Payer: Self-pay | Admitting: *Deleted

## 2014-05-31 ENCOUNTER — Ambulatory Visit (HOSPITAL_BASED_OUTPATIENT_CLINIC_OR_DEPARTMENT_OTHER): Payer: Medicaid Other | Admitting: Lab

## 2014-05-31 LAB — CBC WITH DIFFERENTIAL/PLATELET
BASO%: 0.2 % (ref 0.0–2.0)
Basophils Absolute: 0 10*3/uL (ref 0.0–0.1)
EOS ABS: 0.2 10*3/uL (ref 0.0–0.5)
EOS%: 3.9 % (ref 0.0–7.0)
HEMATOCRIT: 41.2 % (ref 38.4–49.9)
HGB: 13.5 g/dL (ref 13.0–17.1)
LYMPH#: 1.6 10*3/uL (ref 0.9–3.3)
LYMPH%: 31.7 % (ref 14.0–49.0)
MCH: 26.8 pg — ABNORMAL LOW (ref 27.2–33.4)
MCHC: 32.8 g/dL (ref 32.0–36.0)
MCV: 81.7 fL (ref 79.3–98.0)
MONO#: 0.6 10*3/uL (ref 0.1–0.9)
MONO%: 11.7 % (ref 0.0–14.0)
NEUT%: 52.5 % (ref 39.0–75.0)
NEUTROS ABS: 2.7 10*3/uL (ref 1.5–6.5)
NRBC: 0 % (ref 0–0)
Platelets: 101 10*3/uL — ABNORMAL LOW (ref 140–400)
RBC: 5.04 10*6/uL (ref 4.20–5.82)
RDW: 17.6 % — ABNORMAL HIGH (ref 11.0–14.6)
WBC: 5.1 10*3/uL (ref 4.0–10.3)

## 2014-05-31 LAB — FERRITIN CHCC: Ferritin: 21 ng/ml — ABNORMAL LOW (ref 22–316)

## 2014-05-31 NOTE — Telephone Encounter (Signed)
Pt had labs done today prior to phlebotomy.  Dimas AlexandriaAdrena, PA reviewed labs CBC, Ferritin today.  Spoke with pt and informed pt that he will not need phlebotomy today since pt did not meet parameters.  Pt voiced understanding. Pt's  Phone   [phone].

## 2014-06-03 ENCOUNTER — Other Ambulatory Visit: Payer: Medicaid Other

## 2014-06-18 ENCOUNTER — Ambulatory Visit (INDEPENDENT_AMBULATORY_CARE_PROVIDER_SITE_OTHER): Payer: Medicaid Other | Admitting: Podiatrist

## 2014-06-18 ENCOUNTER — Encounter: Payer: Self-pay | Admitting: Podiatrist

## 2014-06-18 VITALS — BP 96/63 | HR 68 | Resp 16

## 2014-06-18 DIAGNOSIS — G5762 Lesion of plantar nerve, left lower limb: Secondary | ICD-10-CM

## 2014-06-18 DIAGNOSIS — E1140 Type 2 diabetes mellitus with diabetic neuropathy, unspecified: Secondary | ICD-10-CM

## 2014-06-18 DIAGNOSIS — G5782 Other specified mononeuropathies of left lower limb: Secondary | ICD-10-CM

## 2014-06-18 DIAGNOSIS — M79676 Pain in unspecified toe(s): Secondary | ICD-10-CM

## 2014-06-18 DIAGNOSIS — B351 Tinea unguium: Secondary | ICD-10-CM

## 2014-06-18 NOTE — Progress Notes (Signed)
Subjective: [name] presents today for follow-up of pain between the second and third metatarsal of the left foot. At the last visit I injected in this area and he states that he's noticed minimal to no relief from the injection. He still complains of pain, numbness, nerve type symptomatology second interspace of the left foot. He also relates that his toenails are long and he would like for them to be trimmed at today's visit. He also states that he's been officially cured of hepatitis C and he just received the news December 24.  Objective: Physical examination is unchanged. Neurovascular status is intact. Pedal pulses are palpable. Neuro type symptomatology elicited second interspace of the left foot. Pain, numbness, tingling and symptomatology consistent with neuroma is noted second interspace left foot. Patient's toenails 1, 2, 3, 4 bilateral are also significantly thick, discolored, dystrophic, yellow brownish discoloration with multiple signs of mycotic infection present. Pain with pressure in all digits is noted.  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 due to his liver and kidney problems and inability to take alcohol sclerosing injections or corticosteroid injections. I did discuss that should there be a large neuroma present the option to remove the neuroma is present however I would like to know if there actually is a neuroma present or if there could possibly be a stress fracture causing his pain prior to considering surgery. Recommended an MRI of the left foot to rule out or confirm a neuroma. Also debrided the nails at today's visit. We will try and get approval and order of the MRI and I will see him back to discuss the results and treatment options and possible surgical consultation for removal of neuroma.

## 2014-06-18 NOTE — Patient Instructions (Signed)
Our office will order the MRI for your foot-- It usually takes 1-2 days to get the study ordered and up to a week to get the result and report. Our office will call you with the result of the study and for further treatment recommendations.

## 2014-06-23 ENCOUNTER — Other Ambulatory Visit: Payer: Self-pay | Admitting: *Deleted

## 2014-06-24 ENCOUNTER — Other Ambulatory Visit (HOSPITAL_BASED_OUTPATIENT_CLINIC_OR_DEPARTMENT_OTHER): Payer: Medicaid Other

## 2014-06-24 LAB — CBC WITH DIFFERENTIAL/PLATELET
BASO%: 0.7 % (ref 0.0–2.0)
Basophils Absolute: 0 10*3/uL (ref 0.0–0.1)
EOS ABS: 0.2 10*3/uL (ref 0.0–0.5)
EOS%: 5.3 % (ref 0.0–7.0)
HCT: 39.9 % (ref 38.4–49.9)
HGB: 12.8 g/dL — ABNORMAL LOW (ref 13.0–17.1)
LYMPH%: 33.8 % (ref 14.0–49.0)
MCH: 26.6 pg — AB (ref 27.2–33.4)
MCHC: 32.1 g/dL (ref 32.0–36.0)
MCV: 83 fL (ref 79.3–98.0)
MONO#: 0.5 10*3/uL (ref 0.1–0.9)
MONO%: 11.6 % (ref 0.0–14.0)
NEUT#: 2.1 10*3/uL (ref 1.5–6.5)
NEUT%: 48.6 % (ref 39.0–75.0)
Platelets: 80 10*3/uL — ABNORMAL LOW (ref 140–400)
RBC: 4.81 10*6/uL (ref 4.20–5.82)
RDW: 21.2 % — AB (ref 11.0–14.6)
WBC: 4.3 10*3/uL (ref 4.0–10.3)
lymph#: 1.5 10*3/uL (ref 0.9–3.3)

## 2014-06-25 ENCOUNTER — Ambulatory Visit (HOSPITAL_BASED_OUTPATIENT_CLINIC_OR_DEPARTMENT_OTHER): Payer: Medicaid Other

## 2014-06-25 ENCOUNTER — Other Ambulatory Visit: Payer: Self-pay | Admitting: *Deleted

## 2014-06-25 LAB — FERRITIN CHCC: Ferritin: 33 ng/ml (ref 22–316)

## 2014-06-30 ENCOUNTER — Telehealth: Payer: Self-pay | Admitting: *Deleted

## 2014-06-30 NOTE — Telephone Encounter (Signed)
Need authorization for his MRI scheduled for tomorrow.  MRI with and without contrast, [id number], he has Medicaid.

## 2014-06-30 NOTE — Telephone Encounter (Signed)
I started the authorization process on-line but they requested additional information.  I called and spoke to [place].  I answered all her clinical questions.  She stated we should hear something within 24 hours of the results.

## 2014-07-01 ENCOUNTER — Inpatient Hospital Stay: Admission: RE | Admit: 2014-07-01 | Payer: Medicaid Other | Source: Ambulatory Visit

## 2014-07-14 ENCOUNTER — Other Ambulatory Visit: Payer: Self-pay | Admitting: *Deleted

## 2014-07-15 ENCOUNTER — Encounter: Payer: Self-pay | Admitting: Hematology

## 2014-07-15 ENCOUNTER — Other Ambulatory Visit (HOSPITAL_BASED_OUTPATIENT_CLINIC_OR_DEPARTMENT_OTHER): Payer: Medicaid Other

## 2014-07-15 ENCOUNTER — Telehealth: Payer: Self-pay | Admitting: Hematology

## 2014-07-15 ENCOUNTER — Ambulatory Visit (HOSPITAL_BASED_OUTPATIENT_CLINIC_OR_DEPARTMENT_OTHER): Payer: Medicaid Other | Admitting: Hematology

## 2014-07-15 DIAGNOSIS — D649 Anemia, unspecified: Secondary | ICD-10-CM

## 2014-07-15 DIAGNOSIS — R740 Nonspecific elevation of levels of transaminase and lactic acid dehydrogenase [LDH]: Secondary | ICD-10-CM

## 2014-07-15 DIAGNOSIS — K7460 Unspecified cirrhosis of liver: Secondary | ICD-10-CM

## 2014-07-15 DIAGNOSIS — B1920 Unspecified viral hepatitis C without hepatic coma: Secondary | ICD-10-CM

## 2014-07-15 DIAGNOSIS — D696 Thrombocytopenia, unspecified: Secondary | ICD-10-CM

## 2014-07-15 DIAGNOSIS — I8390 Asymptomatic varicose veins of unspecified lower extremity: Secondary | ICD-10-CM

## 2014-07-15 LAB — CBC WITH DIFFERENTIAL/PLATELET
BASO%: 0.2 % (ref 0.0–2.0)
Basophils Absolute: 0 10*3/uL (ref 0.0–0.1)
EOS%: 4.3 % (ref 0.0–7.0)
Eosinophils Absolute: 0.2 10*3/uL (ref 0.0–0.5)
HCT: 37.8 % — ABNORMAL LOW (ref 38.4–49.9)
HGB: 12.8 g/dL — ABNORMAL LOW (ref 13.0–17.1)
LYMPH#: 1.1 10*3/uL (ref 0.9–3.3)
LYMPH%: 24.8 % (ref 14.0–49.0)
MCH: 28.5 pg (ref 27.2–33.4)
MCHC: 33.9 g/dL (ref 32.0–36.0)
MCV: 84.2 fL (ref 79.3–98.0)
MONO#: 0.4 10*3/uL (ref 0.1–0.9)
MONO%: 7.9 % (ref 0.0–14.0)
NEUT%: 62.8 % (ref 39.0–75.0)
NEUTROS ABS: 2.8 10*3/uL (ref 1.5–6.5)
Platelets: 68 10*3/uL — ABNORMAL LOW (ref 140–400)
RBC: 4.49 10*6/uL (ref 4.20–5.82)
RDW: 20.3 % — ABNORMAL HIGH (ref 11.0–14.6)
WBC: 4.4 10*3/uL (ref 4.0–10.3)
nRBC: 0 % (ref 0–0)

## 2014-07-15 LAB — FERRITIN CHCC: Ferritin: 23 ng/ml (ref 22–316)

## 2014-07-15 NOTE — Progress Notes (Signed)
[name] HEMATOLOGY OFFICE PROGRESS NOTE  DIAGNOSIS:  1.Hemochromatosis diagnosed in 2007 in [place]  2. 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 Last phelebotomy in 03/2014   PROBLEM LIST 1. Hemochromatosis, diagnosed in 2007 in [place]. 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  2. Diabetes, diagnosed in 2007, managed by his endocrinologist at [facility]. 3. Hepatitis C and liver cirrhosis. This is [name]. He received and anti-hepatitis C treatment in 2015 and his virus title became undetectable afterwards. 4. History of John [name] failure.  INTERIM HISTORY: He was last seen by John John [name] on 04/12/2014. He returns for follow-up. He has some mild-to-moderate fatigue, but able to function well at home. He otherwise feels well overall. No abdominal bloating, nausea, pain, or any other complaints.    MEDICAL HISTORY: Past Medical History  Diagnosis Date  . Diabetes   . High cholesterol   . Anxiety   . Hepatitis C   Hemochromatosis (Iron Overload)  ALLERGIES:  is allergic to sulfa antibiotics; sulfamethoxazole; and testosterone.  MEDICATIONS:    Medication List       This list is accurate as of: 07/15/14 12:44 PM.  Always use your most recent med list.               B-D ULTRAFINE III SHORT PEN 31G X 8 MM Misc  Generic drug:  Insulin Pen Needle  Injects 4 times daily     ciclopirox 8 % solution  Commonly known as:  PENLAC  Apply topically at bedtime.     gabapentin 100 MG capsule  Commonly known as:  NEURONTIN  Take 1 tablet by mouth  at 6:00 PM and 4 tablets by mouth before bedtime.     glucagon 1 MG injection  Inject 1 mg into the vein once as needed.     insulin aspart 100 UNIT/ML injection  Commonly known as:  novoLOG  Inject into the  skin 3 (three) times daily before meals. Sliding scale     insulin glargine 100 UNIT/ML injection  Commonly known as:  LANTUS  Inject 50 Units into the skin daily.     ketoconazole 2 % cream  Commonly known as:  NIZORAL  Apply 1 application topically daily.     lactulose 10 GM/15ML solution  Commonly known as:  CHRONULAC  Take 10 g by mouth.     nadolol 40 MG tablet  Commonly known as:  CORGARD  Take 40 mg by mouth daily.        SURGICAL HISTORY:  Past Surgical History  Procedure Laterality Date  . None      REVIEW OF SYSTEMS:   Constitutional: Denies fevers, chills or abnormal weight loss Eyes: Denies blurriness of vision Ears, nose, mouth, throat, and face: Denies mucositis or sore throat Respiratory: Denies cough, dyspnea or wheezes Cardiovascular: Denies palpitation, chest discomfort or lower extremity swelling Gastrointestinal:  Denies nausea, heartburn or change in bowel habits Skin: Denies abnormal skin rashes Lymphatics: Denies new lymphadenopathy or easy bruising Neurological:Denies numbness, tingling or new weaknesses Behavioral/Psych: Mood is stable, no new changes  All other systems were reviewed with the patient and are negative.  PHYSICAL EXAMINATION: ECOG PERFORMANCE STATUS: 0  Blood pressure 95/65, pulse 57, temperature 97.9 F (36.6 C), temperature source Oral, resp. rate 18, height 6' (  1.829 m), weight 172 lb 8 oz (78.245 kg), SpO2 97 %.  GENERAL:alert, no distress and comfortable; bearded thin male.  SKIN: skin color, texture, turgor are normal, no rashes or significant lesions EYES: normal, Conjunctiva are pink and non-injected, sclera clear OROPHARYNX:no exudate, no erythema and lips, buccal mucosa, and tongue normal  NECK: supple, thyroid normal size, non-tender, without nodularity LYMPH:  no palpable lymphadenopathy in the cervical, axillary or supraclavicular LUNGS: clear to auscultation and percussion with normal breathing effort John [name]:  regular rate & rhythm and no murmurs and no lower extremity edema ABDOMEN:abdomen soft, non-tender and normal bowel sounds; enlarged spleen about 2 cm below costaphrenic angle. No additional stigmata of liver disease.  Musculoskeletal:no cyanosis of digits and no clubbing  NEURO: alert & oriented x 3 with fluent speech, no focal motor/sensory deficits  LABORATORY DATA: Labs:  Lab Results  Component Value Date   WBC 4.4 07/15/2014   HGB 12.8* 07/15/2014   HCT 37.8* 07/15/2014   MCV 84.2 07/15/2014   PLT 68* 07/15/2014   NEUTROABS 2.8 07/15/2014      Chemistry      Component Value Date/Time   NA 138 05/13/2014 0839   NA 138 04/13/2010 1417   K 4.0 05/13/2014 0839   K 3.2* 04/13/2010 1417   CL 106 04/13/2010 1417   CO2 29 05/13/2014 0839   CO2 26 04/13/2010 1417   BUN 10.8 05/13/2014 0839   BUN 6 04/13/2010 1417   CREATININE 0.8 05/13/2014 0839   CREATININE 0.77 04/13/2010 1417      Component Value Date/Time   CALCIUM 9.1 05/13/2014 0839   CALCIUM 8.6 04/13/2010 1417   ALKPHOS 142 05/13/2014 0839   ALKPHOS 120* 04/13/2010 1417   AST 52* 05/13/2014 0839   AST 206* 04/13/2010 1417   ALT 41 05/13/2014 0839   ALT 177* 04/13/2010 1417   BILITOT 0.41 05/13/2014 0839   BILITOT 0.9 04/13/2010 1417     CBC:  Recent Labs Lab 07/15/14 0905  WBC 4.4  NEUTROABS 2.8  HGB 12.8*  HCT 37.8*  MCV 84.2  PLT 68*   Iron/TIBC/Ferritin    Component Value Date/Time   IRON 76 07/14/2013 0841   TIBC 334 07/14/2013 0841   FERRITIN 23 07/15/2014 0905   Studies:  No results found.   RADIOGRAPHIC STUDIES: MR BRAIN WO CONTRAST 09/21/2013  IMPRESSION: Mildly abnormal MRI brain (without) demonstrating: 1. Scattered periventricular, subcortical and juxtacortical foci of non-specific T2 hyperintensities. These findings are non-specific and considerations include autoimmune, inflammatory, post-infectious, microvascular ischemic or migraine associated etiologies. 2. Slightly  hyperintense T1 signal in the bilateral globus pallidi. This is non-specific but can be seen in association with liver disease and mineral deposition. 3. No acute findings   ASSESSMENT: John John [name] 59 y.o. male with a history of Hepatitis C and Hemochromatosis.   PLAN:  1. Hereditary hemochromatosis.  --We discuss maintaining his ferritin less than 50. His ferritin has been persistently low lately after multiple phlebotomy. His last phlebotomy was in October 2015 -Doing the stable ferritin level, we will repeat lab every 2 months, and resume phlebotomy if his ferritin goes up more than 50. He has been mildly anemic, likely secondary to phlebotomy. Will hold phlebotomy if his hemoglobin is less than 10.  2. Cytopenia His mild anemia is likely secondary to phlebotomy and iron deficiency His thrombocytopenia is likely secondary to liver cirrhosis and splenmagely, no signs of bleeding. I'll watch closely.  3. DM2 secondary #1.  --Continue insulin sliding scale and  follow-up with [facility] and [name].   4. 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  --He follows at with DR. Bruggen at [name] and finished treatment for HepC in 2015 --Continue nadolol. He was counseled to report any bleeding.  --His HCV level is non-detected (labs in Care Everywhere) with normalization of his transiminases.  -[facility] by [facility] and lab.   Follow-up.  -lab every 2 month -RTC in 4 month   All questions were answered. The patient knows to call the clinic with any problems, questions or concerns. We can certainly see the patient much sooner if necessary.  I spent 15 minutes counseling the patient face to face. The total time spent in the appointment was 25 minutes.  Malachy Mood 07/15/2014

## 2014-07-15 NOTE — Telephone Encounter (Signed)
gave pt avs report and appts for march/june. appt for phleb tomorrow cxd - per pt not needed.

## 2014-07-20 ENCOUNTER — Encounter: Payer: Self-pay | Admitting: Podiatrist

## 2014-07-26 NOTE — Telephone Encounter (Signed)
Patient sent an email in regards to authorization for MRI.  I attempted to call and inform him that MRI was denied by Medicaid.  Please schedule a follow-up appointment with Dr. Irving ShowsEgerton.  I will email him back of this response.

## 2014-09-09 ENCOUNTER — Other Ambulatory Visit (HOSPITAL_BASED_OUTPATIENT_CLINIC_OR_DEPARTMENT_OTHER): Payer: Medicaid Other

## 2014-09-09 LAB — IRON AND TIBC CHCC
%SAT: 23 % (ref 20–55)
Iron: 88 ug/dL (ref 42–163)
TIBC: 389 ug/dL (ref 202–409)
UIBC: 300 ug/dL (ref 117–376)

## 2014-09-09 LAB — CBC WITH DIFFERENTIAL/PLATELET
BASO%: 0.6 % (ref 0.0–2.0)
Basophils Absolute: 0 10*3/uL (ref 0.0–0.1)
EOS%: 3.8 % (ref 0.0–7.0)
Eosinophils Absolute: 0.1 10*3/uL (ref 0.0–0.5)
HCT: 38.9 % (ref 38.4–49.9)
HEMOGLOBIN: 12.7 g/dL — AB (ref 13.0–17.1)
LYMPH#: 1 10*3/uL (ref 0.9–3.3)
LYMPH%: 26.8 % (ref 14.0–49.0)
MCH: 28.6 pg (ref 27.2–33.4)
MCHC: 32.6 g/dL (ref 32.0–36.0)
MCV: 87.8 fL (ref 79.3–98.0)
MONO#: 0.4 10*3/uL (ref 0.1–0.9)
MONO%: 10.7 % (ref 0.0–14.0)
NEUT%: 58.1 % (ref 39.0–75.0)
NEUTROS ABS: 2.3 10*3/uL (ref 1.5–6.5)
Platelets: 69 10*3/uL — ABNORMAL LOW (ref 140–400)
RBC: 4.44 10*6/uL (ref 4.20–5.82)
RDW: 17.7 % — AB (ref 11.0–14.6)
WBC: 3.9 10*3/uL — ABNORMAL LOW (ref 4.0–10.3)

## 2014-09-09 LAB — FERRITIN CHCC: Ferritin: 26 ng/ml (ref 22–316)

## 2014-09-14 ENCOUNTER — Encounter: Payer: Self-pay | Admitting: Hematology

## 2014-09-24 ENCOUNTER — Telehealth: Payer: Self-pay | Admitting: Podiatrist

## 2014-09-24 ENCOUNTER — Ambulatory Visit: Payer: Medicaid Other | Admitting: Podiatrist

## 2014-09-24 NOTE — Telephone Encounter (Signed)
PT IS UPSET WANTS TO KNOW WHY HE WAS NOT APPROVED FOR SURGERY- PLEASE CALL HIM

## 2014-09-29 ENCOUNTER — Ambulatory Visit: Payer: Medicaid Other | Admitting: Adult Health

## 2014-10-01 ENCOUNTER — Ambulatory Visit (INDEPENDENT_AMBULATORY_CARE_PROVIDER_SITE_OTHER): Payer: Medicaid Other

## 2014-10-01 ENCOUNTER — Ambulatory Visit (INDEPENDENT_AMBULATORY_CARE_PROVIDER_SITE_OTHER): Payer: Medicaid Other | Admitting: Podiatrist

## 2014-10-01 DIAGNOSIS — M9272 Juvenile osteochondrosis of metatarsus, left foot: Secondary | ICD-10-CM

## 2014-10-01 DIAGNOSIS — M79672 Pain in left foot: Secondary | ICD-10-CM

## 2014-10-04 NOTE — Progress Notes (Signed)
Subjective: Mr. Obie DredgeLangley presents today for a routine debridement and for continued pain of the left foot. He states the pain has gotten worse and continues to feel like the neuroma he was treated for in the past.  He relates no new changes in health history or medications since the last visit.    Objective: Physical examination is unchanged. Neurovascular status is intact. Pedal pulses are palpable. Pain, numbness, tingling and symptomatology consistent with neuroma is noted second interspace left foot. Patient's toenails 1, 2, 3, 4 bilateral are also significantly thick, discolored, dystrophic, yellow brownish discoloration with multiple signs of mycotic infection present. Pain with pressure in all digits is noted.  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  Elongated 2nd metatarsal is also present.    Assessment: suspeted friberg's infarction, symptomatic and mycotic toenails   Plan: debridement of symptomatic toenails was performed courtesy of Dr. Stacie AcresMayer.  xrays also ordered via Dr. Stacie AcresMayer-- I saw the patient and recommended an air fracture walker for 4 weeks.  The mri requested was denied and I would still like to get an mri of the foot-- will have him back in 4 weeks for recheck.

## 2014-10-20 ENCOUNTER — Encounter: Payer: Self-pay | Admitting: Podiatrist

## 2014-10-20 ENCOUNTER — Ambulatory Visit (INDEPENDENT_AMBULATORY_CARE_PROVIDER_SITE_OTHER): Payer: Medicaid Other | Admitting: Podiatrist

## 2014-10-20 VITALS — BP 124/74 | HR 83 | Resp 16

## 2014-10-20 DIAGNOSIS — E1140 Type 2 diabetes mellitus with diabetic neuropathy, unspecified: Secondary | ICD-10-CM

## 2014-10-20 DIAGNOSIS — M9272 Juvenile osteochondrosis of metatarsus, left foot: Secondary | ICD-10-CM

## 2014-10-20 DIAGNOSIS — G5762 Lesion of plantar nerve, left lower limb: Secondary | ICD-10-CM

## 2014-10-20 DIAGNOSIS — G5782 Other specified mononeuropathies of left lower limb: Secondary | ICD-10-CM

## 2014-10-20 NOTE — Progress Notes (Signed)
  Subjective: Patient presents today for continued severe left foot pain. At the last visit he was given a boot and instructed on its wear. X-rays showed sclerosis of the second metatarsal head consistent with a Freiberg's infraction. The patient states that he was unable to wear the boot consistently as he was helping his daughter off her bunk bed and he fell hurting his right leg and that wearing the boot on the left leg was difficult for him.  Objective: Neurovascular status unchanged acute point tenderness noted second metatarsal head and second interspace of the left foot. Difficult to tell if it's from the Freiberg's or if there is a neuroma present. He is extremely tender and symptomatic to this left foot. Boot therapy has failed to relieve his pain.  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 At this point conservative treatments have failed and an MRI is necessary to determine the cause of the pain and how to treat it. MRI is requested for surgical planning. We will try again to order an MRI on this left foot. In the meantime he is instructed on the use of the boot. We will contact him with the MRI is done to determine treatment plans and options.

## 2014-11-02 ENCOUNTER — Other Ambulatory Visit: Payer: Self-pay | Admitting: *Deleted

## 2014-11-02 DIAGNOSIS — M9272 Juvenile osteochondrosis of metatarsus, left foot: Secondary | ICD-10-CM

## 2014-11-02 DIAGNOSIS — G5782 Other specified mononeuropathies of left lower limb: Secondary | ICD-10-CM

## 2014-11-05 ENCOUNTER — Encounter: Payer: Self-pay | Admitting: Nurse Practitioner

## 2014-11-05 ENCOUNTER — Ambulatory Visit (INDEPENDENT_AMBULATORY_CARE_PROVIDER_SITE_OTHER): Payer: Medicaid Other | Admitting: Nurse Practitioner

## 2014-11-05 VITALS — BP 118/73 | HR 85 | Ht 72.0 in | Wt 166.2 lb

## 2014-11-05 DIAGNOSIS — R251 Tremor, unspecified: Secondary | ICD-10-CM

## 2014-11-05 DIAGNOSIS — F959 Tic disorder, unspecified: Secondary | ICD-10-CM

## 2014-11-05 NOTE — Progress Notes (Signed)
[facility]  PATIENT: John [name] DOB: February 02, 1956   REASON FOR VISIT: Follow-up for motor tics  HISTORY FROM: Patient    HISTORY OF PRESENT ILLNESS:John [name] is a 59 year old male with a history of motor and voice tics. He was last seen by Ward Givens nurse practitioner 03/30/2014 He returns today for follow-up. He was prescribed Neurontin 300 mg TID but could not tolerate that due to sleepiness. He currently is taking $RemoveB'100mg'RlPFEHMI$  at 6pm and $Remov'300mg'uTgXnJ$  at hs.  His significant other notices some movements during the night. The patient doesn't always notice it but it occasionally will wake him up and he will go urinate and go back to bed. He also was diagnosed with Hepatitis C at [facility] as well as stage 4 cirrhosis of the liver. He is currently done with Treatment for the hepatitis C and his latest serum was negative. He does not wish to be on benzodiazepine. He says that he can tolerate the symptoms well at this point, does not want to have additional medication added. He is drowsy when taking gabapentin during the day. He returns for reevaluation  HISTORY (YY): John [name] is a 59 years old Caucasian male, referred by his primary care physician Dr. Karlton Lemon for evaluation of episodes of uncontrollable sudden body movement. Initial visit was in January 2015. He had past medical history of diabetes, hyperlipidemia, hemochromatosis, hepatitis C, hepatic cirrhosis in August 2007, he presented with generalized weakness, glucose more than 1000, he was found to have hemochromatosis, stage IV hepatic cirrhosis based on biopsy, evaluation was done at [facility] was diagnosed with hepatitis C at the same time, was not sure the source of infection, he had tatto before 1980s. Around that time, he also developed episode of uncontrollable body jerking movement, making sound sometimes, it progressively worsened over the years, During interview, he had multiple episodes of uncontrollable  sudden body flexion, making sound, no loss of consciousness, He was previously diagnosed with motor tics, was given prescription of clonazepam, but he rarely tries it, worry about the side effect, clonazepam would put him into sleep, also worry about the side effect of addiction, liver damage. [place] 05/2013 showed Hepatic cirrhosis. Multiple echogenic nodules within a heterogenous liver parenchyma. Cholelithiasis with tumefactive sludge. Persistent gallbladder wall thickening is likely related to underlying liver disease. He was previously evaluated by neurologists at [facility] for similar complains, was given prescription of gabapentin, which does help him some. He also complains of right hip pain, difficulty crossing his legs because of hip pain, radiating pain to his right groin area  UPDATE April 20th 2015:  He is with his girl friend [name] since 2002 at today's clinical visit. We have reviewed MRI of the brain which showed mild small vessel disease, no acute lesions, no significant basal ganglion signal changes X-ray of right hip was essentially normal, He was seen by [facility] resident Neurology clinic in November 2013, was diagnosed with complex tic disorder, he was getting prescription of clonazepam, which was helping him, but again patient was complains about potential side effect. He has been followed up closely by GI specialist at [facility], most recen visit was in April 14th 2015 by Dr. Deidre Ala on starting [place]. He was diagnosed with hepatitis C in 2006. He has a liver biopsy then in [place]. He was later seen at [facility]. He has never received hepatitis C treatment in part because he was concerned about the possible psychiatric side effects of  interferon.He undergoes phlebotomy every 2 weeks since 05/2013; he is seeing an outside hematologist at [facility] in [place].He does not have hepatic encephalopathy or ascites. He takes lactulose prn for constipation.  He is on nadolol for varices, but has had no bleeding events.He responded very well to Neurontin 300 mg 3 times a day, but complains of tongue swelling with certain generic form of gabapentin     REVIEW OF SYSTEMS: Full 14 system review of systems performed and notable only for those listed, all others are neg:  Constitutional: neg  Cardiovascular: neg Ear/Nose/Throat: neg  Skin: neg Eyes: neg Respiratory: neg Gastroitestinal: neg  Hematology/Lymphatic: neg  Endocrine: neg Musculoskeletal: Joint pain Allergy/Immunology: neg Neurological: Weakness  Psychiatric: Anxiety Sleep : Daytime sleepiness ALLERGIES: Allergies  Allergen Reactions  . Sulfa Antibiotics Swelling    Of tongue  . Sulfamethoxazole Swelling  . Testosterone Rash and Dermatitis    HOME MEDICATIONS: Outpatient Prescriptions Prior to Visit  Medication Sig Dispense Refill  . ciclopirox (PENLAC) 8 % solution Apply topically at bedtime. 6.6 mL 2  . gabapentin (NEURONTIN) 100 MG capsule Take 1 tablet by mouth  at 6:00 PM and 4 tablets by mouth before bedtime. (Patient taking differently: Take 1 tablet at 6 PM and 3 tablets by mouth before bedtime.) 150 capsule 11  . glucagon 1 MG injection Inject 1 mg into the vein once as needed.    . insulin aspart (NOVOLOG) 100 UNIT/ML injection Inject into the skin 3 (three) times daily before meals. Sliding scale    . insulin glargine (LANTUS) 100 UNIT/ML injection Inject 50 Units into the skin daily.     . Insulin Pen Needle (B-D ULTRAFINE III SHORT PEN) 31G X 8 MM MISC Injects 4 times daily    . ketoconazole (NIZORAL) 2 % cream Apply 1 application topically daily. (Patient taking differently: Apply 1 application topically daily as needed. ) 60 g 2  . lactulose (CHRONULAC) 10 GM/15ML solution Take 10 g by mouth daily as needed.     . nadolol (CORGARD) 40 MG tablet Take 40 mg by mouth daily.     No facility-administered medications prior to visit.    PAST MEDICAL  HISTORY: Past Medical History  Diagnosis Date  . Diabetes   . High cholesterol   . Anxiety   . Hepatitis C   . Foot pain left    to have MRI tues.     PAST SURGICAL HISTORY: Past Surgical History  Procedure Laterality Date  . None      FAMILY HISTORY: History reviewed. No pertinent family history.  SOCIAL HISTORY: History   Social History  . Marital Status: Single    Spouse Name: N/A  . Number of Children: 4  . Years of Education: 14   Occupational History  .      disabled   Social History Main Topics  . Smoking status: Never Smoker   . Smokeless tobacco: Never Used  . Alcohol Use: No     Comment: Quit 2007  . Drug Use: No     Comment: Quit 1980  . Sexual Activity: Not on file   Other Topics Concern  . Not on file   Social History Narrative   Patient lives at home with his girlfriend.    Patient is disabled.   Right handed.   Caffeine- 1-2 cups of caffeine daily.   Education- two years of college.           PHYSICAL EXAM  Filed Vitals:  11/05/14 0755  BP: 118/73  Pulse: 85  Height: 6' (1.829 m)  Weight: 166 lb 3.2 oz (75.388 kg)   Body mass index is 22.54 kg/(m^2). Generalized: Well developed, in no acute distress   Neurological examination  Mentation: Alert oriented to time, place, history taking. Follows all commands speech and language fluent Cranial nerve II-XII: Pupils were equal round reactive to light. Extraocular movements were full, visual field were full on confrontational test. Facial sensation and strength were normal. Uvula tongue midline. Head turning and shoulder shrug were normal and symmetric. Motor: The motor testing reveals 5 over 5 strength of all 4 extremities. Good symmetric motor tone is noted throughout.  Sensory: Sensory testing is intact to soft touch on all 4 extremities. No evidence of extinction is noted.  Coordination: Cerebellar testing reveals good finger-nose-finger and heel-to-shin bilaterally.  Gait  and station: Gait is slightly wide-based. Tandem gait is normal. Romberg is negative. No drift is seen.  Reflexes: Deep tendon reflexes are symmetric and normal bilaterally.    DIAGNOSTIC DATA (LABS, IMAGING, TESTING) - I reviewed patient records, labs, notes, testing and imaging myself where available.  Lab Results  Component Value Date   WBC 3.9* 09/09/2014   HGB 12.7* 09/09/2014   HCT 38.9 09/09/2014   MCV 87.8 09/09/2014   PLT 69* 09/09/2014      Component Value Date/Time   NA 138 05/13/2014 0839   NA 138 04/13/2010 1417   K 4.0 05/13/2014 0839   K 3.2* 04/13/2010 1417   CL 106 04/13/2010 1417   CO2 29 05/13/2014 0839   CO2 26 04/13/2010 1417   GLUCOSE 177* 05/13/2014 0839   GLUCOSE 82 04/13/2010 1417   BUN 10.8 05/13/2014 0839   BUN 6 04/13/2010 1417   CREATININE 0.8 05/13/2014 0839   CREATININE 0.77 04/13/2010 1417   CALCIUM 9.1 05/13/2014 0839   CALCIUM 8.6 04/13/2010 1417   PROT 7.5 05/13/2014 0839   PROT 7.9 04/13/2010 1417   ALBUMIN 2.8* 05/13/2014 0839   ALBUMIN 2.8* 04/13/2010 1417   AST 52* 05/13/2014 0839   AST 206* 04/13/2010 1417   ALT 41 05/13/2014 0839   ALT 177* 04/13/2010 1417   ALKPHOS 142 05/13/2014 0839   ALKPHOS 120* 04/13/2010 1417   BILITOT 0.41 05/13/2014 0839   BILITOT 0.9 04/13/2010 1417   GFRNONAA >60 04/13/2010 1417   GFRAA  04/13/2010 1417    >60        The eGFR has been calculated using the MDRD equation. This calculation has not been validated in all clinical situations. eGFR's persistently <60 mL/min signify possible Chronic Kidney Disease.    ASSESSMENT AND PLAN  59 y.o. year old male  has a past medical history of Diabetes; High cholesterol; Anxiety; Hepatitis C; and Foot pain (left). here to follow-up for motor tics.The patient is a current patient of Dr. Krista Blue who is out of the office today . This note is sent to the work in doctor.      Continue Gabapentin as prescribed does not need refills.  F/U in 6 months next  appt with [place], [facility], [facility], APRN  [facility] [address], [place], [place] [address] [phone]

## 2014-11-05 NOTE — Progress Notes (Signed)
I agree with the above plan 

## 2014-11-05 NOTE — Patient Instructions (Signed)
Continue Gabapentin as prescribed does not need refills.  F/U in 6 months next appt with John PennyMegan Millikan

## 2014-11-09 ENCOUNTER — Other Ambulatory Visit: Payer: Medicaid Other

## 2014-11-11 ENCOUNTER — Telehealth: Payer: Self-pay | Admitting: Hematology

## 2014-11-11 ENCOUNTER — Ambulatory Visit (HOSPITAL_BASED_OUTPATIENT_CLINIC_OR_DEPARTMENT_OTHER): Payer: Medicaid Other | Admitting: Hematology

## 2014-11-11 ENCOUNTER — Other Ambulatory Visit (HOSPITAL_BASED_OUTPATIENT_CLINIC_OR_DEPARTMENT_OTHER): Payer: Medicaid Other

## 2014-11-11 ENCOUNTER — Telehealth: Payer: Self-pay | Admitting: *Deleted

## 2014-11-11 ENCOUNTER — Encounter: Payer: Self-pay | Admitting: Hematology

## 2014-11-11 DIAGNOSIS — B1920 Unspecified viral hepatitis C without hepatic coma: Secondary | ICD-10-CM

## 2014-11-11 DIAGNOSIS — D6959 Other secondary thrombocytopenia: Secondary | ICD-10-CM | POA: Diagnosis not present

## 2014-11-11 DIAGNOSIS — D696 Thrombocytopenia, unspecified: Secondary | ICD-10-CM | POA: Insufficient documentation

## 2014-11-11 DIAGNOSIS — Z01812 Encounter for preprocedural laboratory examination: Secondary | ICD-10-CM

## 2014-11-11 LAB — IRON AND TIBC CHCC
%SAT: 66 % — AB (ref 20–55)
IRON: 261 ug/dL — AB (ref 42–163)
TIBC: 395 ug/dL (ref 202–409)
UIBC: 134 ug/dL (ref 117–376)

## 2014-11-11 LAB — CBC WITH DIFFERENTIAL/PLATELET
BASO%: 0.3 % (ref 0.0–2.0)
BASOS ABS: 0 10*3/uL (ref 0.0–0.1)
EOS ABS: 0.2 10*3/uL (ref 0.0–0.5)
EOS%: 3.9 % (ref 0.0–7.0)
HCT: 44.3 % (ref 38.4–49.9)
HGB: 14.9 g/dL (ref 13.0–17.1)
LYMPH#: 1.2 10*3/uL (ref 0.9–3.3)
LYMPH%: 30.1 % (ref 14.0–49.0)
MCH: 31 pg (ref 27.2–33.4)
MCHC: 33.7 g/dL (ref 32.0–36.0)
MCV: 92.2 fL (ref 79.3–98.0)
MONO#: 0.4 10*3/uL (ref 0.1–0.9)
MONO%: 9.8 % (ref 0.0–14.0)
NEUT#: 2.3 10*3/uL (ref 1.5–6.5)
NEUT%: 55.9 % (ref 39.0–75.0)
PLATELETS: 80 10*3/uL — AB (ref 140–400)
RBC: 4.8 10*6/uL (ref 4.20–5.82)
RDW: 15.9 % — AB (ref 11.0–14.6)
WBC: 4 10*3/uL (ref 4.0–10.3)

## 2014-11-11 LAB — FERRITIN CHCC: Ferritin: 36 ng/ml (ref 22–316)

## 2014-11-11 NOTE — Telephone Encounter (Signed)
Sent msg to add Phlebotomy and if they would contact pt with time for tomorrow.... KJ

## 2014-11-11 NOTE — Telephone Encounter (Signed)
Gave and printed appt sched and avs fo rpt for Aug, OCT, Dec

## 2014-11-11 NOTE — Progress Notes (Addendum)
[facility] HEMATOLOGY OFFICE PROGRESS NOTE  DIAGNOSIS:  1.Hemochromatosis diagnosed in 2007 in [place]  2. 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 Last phelebotomy in 03/2014 she  PROBLEM LIST 1. Hemochromatosis, diagnosed in 2007 in [place]. 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  2. Diabetes, diagnosed in 2007, managed by his endocrinologist at [facility]. 3. Hepatitis C and liver cirrhosis. This is managed by Dr. Gardiner Coins at [facility]. He received and anti-hepatitis C treatment in 2015 and his virus title became undetectable afterwards. 4. History of heart failure.  INTERIM HISTORY: He return for follow up. He is doing moderately well overall. He is planned to have 7  Teeth pulled on 12/22/14. He also has chronic pain between his right second and third toes, he was seen by a podiatrist and will have MRI to see if he needs surgery. He has stable mild to moderate fatigue, shortness breast on moderate to heavy exertion, no significant pain, nausea, abdominal bloating or legs edema. He is able to take and do routine activities.     MEDICAL HISTORY: Past Medical History  Diagnosis Date  . Diabetes   . High cholesterol   . Anxiety   . Hepatitis C   . Foot pain left    to have MRI tues.   Hemochromatosis (Iron Overload)  ALLERGIES:  is allergic to sulfa antibiotics; sulfamethoxazole; and testosterone.  MEDICATIONS:    Medication List       This list is accurate as of: 11/11/14 11:02 AM.  Always use your most recent med list.               B-D ULTRAFINE III SHORT PEN 31G X 8 MM Misc  Generic drug:  Insulin Pen Needle  Injects 4 times daily     ciclopirox 8 % solution  Commonly known as:  PENLAC  Apply topically at bedtime.     gabapentin 100 MG capsule  Commonly known  as:  NEURONTIN  Take 1 tablet by mouth  at 6:00 PM and 4 tablets by mouth before bedtime.     glucagon 1 MG injection  Inject 1 mg into the vein once as needed.     insulin aspart 100 UNIT/ML injection  Commonly known as:  novoLOG  Inject into the skin 3 (three) times daily before meals. Sliding scale     insulin glargine 100 UNIT/ML injection  Commonly known as:  LANTUS  Inject 50 Units into the skin daily.     ketoconazole 2 % cream  Commonly known as:  NIZORAL  Apply 1 application topically daily.     lactulose 10 GM/15ML solution  Commonly known as:  CHRONULAC  Take 10 g by mouth daily as needed.     propranolol 40 MG tablet  Commonly known as:  INDERAL  Take 40 mg by mouth 2 (two) times daily.        SURGICAL HISTORY:  Past Surgical History  Procedure Laterality Date  . None      REVIEW OF SYSTEMS:   Constitutional: Denies fevers, chills or abnormal weight loss Eyes: Denies blurriness of vision Ears, nose, mouth, throat, and face: Denies mucositis or sore throat Respiratory: Denies cough, dyspnea or wheezes Cardiovascular: Denies palpitation, chest discomfort or lower extremity swelling Gastrointestinal:  Denies nausea, heartburn or change in bowel habits  Skin: Denies abnormal skin rashes Lymphatics: Denies new lymphadenopathy or easy bruising Neurological:Denies numbness, tingling or new weaknesses Behavioral/Psych: Mood is stable, no new changes  All other systems were reviewed with the patient and are negative.  PHYSICAL EXAMINATION: ECOG PERFORMANCE STATUS: 0  Blood pressure 125/79, pulse 81, temperature 98.2 F (36.8 C), temperature source Oral, resp. rate 17, height 6' (1.829 m), weight 163 lb 1.6 oz (73.982 kg), SpO2 98 %.  GENERAL:alert, no distress and comfortable; bearded thin male.  SKIN: skin color, texture, turgor are normal, no rashes or significant lesions EYES: normal, Conjunctiva are pink and non-injected, sclera clear OROPHARYNX:no  exudate, no erythema and lips, buccal mucosa, and tongue normal  NECK: supple, thyroid normal size, non-tender, without nodularity LYMPH:  no palpable lymphadenopathy in the cervical, axillary or supraclavicular LUNGS: clear to auscultation and percussion with normal breathing effort HEART: regular rate & rhythm and no murmurs and no lower extremity edema ABDOMEN:abdomen soft, non-tender and normal bowel sounds; enlarged spleen about 2 cm below costaphrenic angle. No additional stigmata of liver disease.  Musculoskeletal:no cyanosis of digits and no clubbing  NEURO: alert & oriented x 3 with fluent speech, no focal motor/sensory deficits  LABORATORY DATA: Labs:  CBC Latest Ref Rng 11/11/2014 09/09/2014 07/15/2014  WBC 4.0 - 10.3 10e3/uL 4.0 3.9(L) 4.4  Hemoglobin 13.0 - 17.1 g/dL 16.114.9 12.7(L) 12.8(L)  Hematocrit 38.4 - 49.9 % 44.3 38.9 37.8(L)  Platelets 140 - 400 10e3/uL 80(L) 69(L) 68(L)    CMP Latest Ref Rng 05/13/2014 01/28/2014 10/07/2013  Glucose 70 - 140 mg/dl 096(E177(H) 454(U215(H) 981(X356(H)  BUN 7.0 - 26.0 mg/dL 91.410.8 78.213.7 95.611.9  Creatinine 0.7 - 1.3 mg/dL 0.8 0.9 0.9  Sodium 213136 - 145 mEq/L 138 136 133(L)  Potassium 3.5 - 5.1 mEq/L 4.0 3.9 4.2  Chloride 96 - 112 mEq/L - - -  CO2 22 - 29 mEq/L 29 28 23   Calcium 8.4 - 10.4 mg/dL 9.1 8.8 8.4  Total Protein 6.4 - 8.3 g/dL 7.5 6.7 7.0  Total Bilirubin 0.20 - 1.20 mg/dL 0.860.41 5.781.02 4.690.48  Alkaline Phos 40 - 150 U/L 142 130 170(H)  AST 5 - 34 U/L 52(H) 32 131(H)  ALT 0 - 55 U/L 41 19 88(H)   Ferritin  Status: Finalresult Visible to patient:  Not Released Nextappt: 11/12/2014 at 02:45 PM in Radiology (GI-GIM MR 1) Dx:  Hemochromatosis           Ref Range 9:20 AM  531mo ago  65mo ago     Ferritin 22 - 316 ng/ml 36 26 23        Iron and TIBC [facility]  Status: Finalresult Visible to patient:  Not Released Nextappt: 11/12/2014 at 02:45 PM in Radiology (GI-GIM MR 1) Dx:  Hemochromatosis              Ref Range  9:20 AM  531mo ago  769yr ago     Iron 42 - 163 ug/dL 629261 (H) 88 76    TIBC 528202 - 409 ug/dL 413395 244389 010334    UIBC 272117 - 376 ug/dL 536134 644300 034258    %SAT 20 - 55 % 66 (H) 23 23           RADIOGRAPHIC STUDIES: MR BRAIN WO CONTRAST 09/21/2013  IMPRESSION: Mildly abnormal MRI brain (without) demonstrating: 1. Scattered periventricular, subcortical and juxtacortical foci of non-specific T2 hyperintensities. These findings are non-specific and considerations include autoimmune, inflammatory, post-infectious, microvascular ischemic or migraine associated etiologies. 2. Slightly hyperintense T1 signal  in the bilateral globus pallidi. This is non-specific but can be seen in association with liver disease and mineral deposition. 3. No acute findings   ASSESSMENT: John [name] 59 y.o. male with a history of Hepatitis C and Hemochromatosis.   PLAN:  1. Hereditary hemochromatosis.  --We discuss maintaining his ferritin less than 50 and transferrin saturation <50%).  -His ferritin is 36 today, however his serum iron is significantly elevated at 261, with transferrin saturation 66%. I'll schedule him to have a 1 unit phlebotomy -continue lab every 2 months, and resume phlebotomy if his ferritin goes up more than 50 or transfer to an saturation above  Will hold phlebotomy if his hemoglobin is less than 10. -We discussed low iron diet. Apparently he still eats a lot of red meat, I encourage him to eat last red meat, and consider chicken, [place], seafood etc. as his main protein resource.  2. Cytopenia His mild anemia is likely secondary to phlebotomy and iron deficiency, resolved now  His thrombocytopenia is likely secondary to liver cirrhosis and splenmagely, no signs of bleeding. I'll watch closely.  3. DM2 secondary #1.  --Continue insulin sliding scale and follow-up with [facility].   4. 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  --He  follows at with DR. Bruggen at [facility] and finished treatment for HepC in 2015 --Continue nadolol. He was counseled to report any bleeding.  --His HCV level is non-detected (labs in Care Everywhere) with normalization of his transiminases.  -His is under [facility] surveillance by ultrasound and lab.  5 history of CHF -He has never had echo in all system. I'll obtain one in the next months..    Follow-up.  -Phlebotomy this week  -Repeat a CBC, iron study every 2 months, phlebotomy if ferritin > 50 or transferrin saturation >50%  -I'll see him back in 6 months.   All questions were answered. The patient knows to call the clinic with any problems, questions or concerns. We can certainly see the patient much sooner if necessary.  I spent 20 minutes counseling the patient face to face. The total time spent in the appointment was 30 minutes.  Malachy Mood 11/11/2014

## 2014-11-11 NOTE — Telephone Encounter (Signed)
"  He's scheduled for his MRI today.  He was authorized for MRI without contrast only but he is scheduled for with and without.  Can you call and get it changed to [id number]?"  I will try.  Do you have a number I can call?  "Yes you can call [phone]. The authorization number is [id number].  I called and spoke to [employer] to get authorization changed to with and without contrast.  She stated it would have to be reviewed my clinical doctor.  It will take 24 hours for a response.  I called and left a message for Victorino DikeJennifer at [facility] about the response from [place].  May have to reschedule patient.  Brittney from [facility] [employer] called and stated, "Patient will need to have labs done for a BUN-Creatinine prior to his MRI scheduled for tomorrow at [facility].  He is aware of this and was given directions on where to go."

## 2014-11-11 NOTE — Telephone Encounter (Signed)
VM message received from Dr. Randa EvensJensen's office @ 11:26 am with a question regarding pt's platelets. Attempted call back but had to leave VM for return call. Will await call back.

## 2014-11-12 ENCOUNTER — Telehealth: Payer: Self-pay | Admitting: Hematology

## 2014-11-12 ENCOUNTER — Encounter: Payer: Self-pay | Admitting: Podiatrist

## 2014-11-12 ENCOUNTER — Telehealth: Payer: Self-pay | Admitting: *Deleted

## 2014-11-12 ENCOUNTER — Other Ambulatory Visit: Payer: Self-pay | Admitting: Podiatrist

## 2014-11-12 ENCOUNTER — Ambulatory Visit
Admission: RE | Admit: 2014-11-12 | Discharge: 2014-11-12 | Disposition: A | Discharge status: Home / Self Care | Payer: Medicaid Other | Source: Ambulatory Visit | Attending: Podiatrist | Admitting: Podiatrist

## 2014-11-12 ENCOUNTER — Ambulatory Visit (HOSPITAL_BASED_OUTPATIENT_CLINIC_OR_DEPARTMENT_OTHER): Payer: Medicaid Other

## 2014-11-12 DIAGNOSIS — G5782 Other specified mononeuropathies of left lower limb: Secondary | ICD-10-CM

## 2014-11-12 LAB — BUN+CREAT: BUN/Creatinine Ratio: 18.3 Ratio

## 2014-11-12 NOTE — Telephone Encounter (Signed)
lvm for pt regarding to 6.16 echo °

## 2014-11-12 NOTE — Progress Notes (Signed)
Pt presented for therapeutic phlebotomy as scheduled by Dr. Mosetta PuttFeng based on 11/11/2014 labs.  Ferritin <50; however, per office note from 11/11/2014, 1 unit phlebotomy performed for elevated transferrin saturation.  Pt arrived with [employer] sandwich in which he ate and we waited about 20 minutes before starting phlebotomy.  Phlebotomy performed via left AC and 510g removed over time span of 15 minutes from 12:20 to 12:35.  Pt observed for 30 minutes post procedure and vitals remain stable.  Pt provided with liquids throughout duration and feels comfortable for discharge.

## 2014-11-12 NOTE — Telephone Encounter (Signed)
Per staff message and POF I have scheduled appts. Advised scheduler of appts. JMW  

## 2014-11-12 NOTE — Telephone Encounter (Signed)
If his dentist call back, you may direct him to call my cell [phone] if he has questions. I told the pt yesterday he may have some bleeding issue when he has teeth extraction due to his moderate thrombocytopenia.   John [name], [name]  11/12/2014

## 2014-11-12 NOTE — Telephone Encounter (Signed)
S/w pt confirming Phlebotomy apt for today per 06/02 POF, also advised we have added to his next visits... KJ

## 2014-11-25 ENCOUNTER — Other Ambulatory Visit (HOSPITAL_COMMUNITY): Payer: Medicaid Other

## 2014-12-02 ENCOUNTER — Ambulatory Visit (HOSPITAL_COMMUNITY): Admission: RE | Admit: 2014-12-02 | Payer: Medicaid Other | Source: Ambulatory Visit

## 2014-12-03 ENCOUNTER — Ambulatory Visit (INDEPENDENT_AMBULATORY_CARE_PROVIDER_SITE_OTHER): Payer: Medicaid Other | Admitting: Podiatry

## 2014-12-03 ENCOUNTER — Telehealth: Payer: Self-pay | Admitting: Hematology

## 2014-12-03 DIAGNOSIS — E1040 Type 1 diabetes mellitus with diabetic neuropathy, unspecified: Secondary | ICD-10-CM | POA: Diagnosis not present

## 2014-12-03 DIAGNOSIS — M9272 Juvenile osteochondrosis of metatarsus, left foot: Secondary | ICD-10-CM | POA: Diagnosis not present

## 2014-12-03 MED ORDER — UREA 39 % EX CREA: 1.0000 "application " | TOPICAL_CREAM | Freq: Every day | CUTANEOUS | Status: DC

## 2014-12-03 NOTE — Telephone Encounter (Signed)
pt cld stated that he missed ECHO-cld pt back and left # [phone] to call and r/s at a convienent time for pt

## 2014-12-04 NOTE — Progress Notes (Signed)
Patient ID: John [name], male   DOB: 11-Mar-1956, 59 y.o.   MRN: [id number]  Subjective: 59 year old male presents the ossific for follow-up evaluation of left foot pain discuss MRI results. He states he is continued pain to the left foot for which she points the second MTPJ for several years. He states it is a thready achy pain at rest however discussed, sharp pain relief of pressure or weightbearing to the area. He states this sharp pain when he tries to bend his second toe joint. He denies any specific injury or trauma to this area. He has tried shoe gear modifications does not seem to help. He states he been trying to wear the CAM walker although he was unable to do so. He also states that he has dry, cracked heels which he is tried multiple over-the-counter treatments without any resolution. Denies any bleeding or any redness around the area. He definitely has tingling and numbness to his feet for which she has been placed on gabapentin for neuropathy which seems to help. No other complaints at this time in no acute changes since last appointment. Denies any systemic complaints as fevers, chills, nausea, vomiting.  Objective: AAO x3, NAD DP/PT pulses palpable bilaterally, CRT less than 3 seconds Protective sensation slightly decreased with Simms Weinstein monofilament, vibratory sensation intact, Achilles tendon reflex intact There is tenderness along the second metatarsal head on the left foot. There is discomfort with second MTPJ range of motion and the range of motion is limited due to pain. There is no overlying edema, erythema, increase in warmth. There is no tenderness upon palpation of the second interspace and there is no palpable neuroma identified. There is no reproduction symptoms upon compression the interspace. No areas of tenderness to bilateral lower extremities. MMT 5/5, ROM WNL.  No open lesions or pre-ulcerative lesions.  No overlying edema, erythema, increase in warmth to bilateral  lower extremities.  No pain with calf compression, swelling, warmth, erythema bilaterally.   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  -Discussed both conservative and surgical treatment options. Conservative treatments discussed with him orthotics to help offload the second metatarsal head as well as surgical intervention which would likely be second metatarsal head removal with implan At this time he elected to proceed with conservative treatment. Given his diabetes with neuropathy he may qualify for diabetic shoes. A prescription for diabetic shoes last custom inserts given the patient for [employer]. If unable to get the orthotics, call the office.  -If conservative treatment continues to not improve his symptoms, will consider surgical intervention. -Follow-up after shoes/inserts or sooner if any problems arise. In the meantime, encouraged to call the office with any questions, concerns, change in symptoms.   Ovid Curd, [name]

## 2014-12-08 ENCOUNTER — Telehealth: Payer: Self-pay | Admitting: *Deleted

## 2014-12-08 NOTE — Telephone Encounter (Signed)
[employer] sent fax stating pt's insurance would not cover Urea 39% Cream at $366.55 cost.  Dr. Ardelle AntonWagoner states pt may use AmLactin Cream OTC.  I informed pt, pt states he also lost his [employer] rx.  I rewrote from Dr. Gabriel RungWagoner's orders on 12/03/2014.  Pt to pick up in the [facility] office.

## 2014-12-11 DIAGNOSIS — K047 Periapical abscess without sinus: Secondary | ICD-10-CM | POA: Insufficient documentation

## 2014-12-15 DIAGNOSIS — I8510 Secondary esophageal varices without bleeding: Secondary | ICD-10-CM | POA: Insufficient documentation

## 2014-12-30 ENCOUNTER — Other Ambulatory Visit: Payer: Self-pay | Admitting: *Deleted

## 2014-12-30 ENCOUNTER — Encounter: Payer: Self-pay | Admitting: Hematology

## 2015-01-11 ENCOUNTER — Inpatient Hospital Stay (HOSPITAL_COMMUNITY): Admission: RE | Admit: 2015-01-11 | Payer: Medicaid Other | Source: Ambulatory Visit

## 2015-01-13 ENCOUNTER — Telehealth: Payer: Self-pay | Admitting: Hematology

## 2015-01-13 ENCOUNTER — Other Ambulatory Visit (HOSPITAL_BASED_OUTPATIENT_CLINIC_OR_DEPARTMENT_OTHER): Payer: Medicaid Other

## 2015-01-13 ENCOUNTER — Other Ambulatory Visit: Payer: Self-pay | Admitting: Hematology

## 2015-01-13 ENCOUNTER — Ambulatory Visit: Payer: Medicaid Other

## 2015-01-13 LAB — CBC WITH DIFFERENTIAL/PLATELET
BASO%: 0.2 % (ref 0.0–2.0)
Basophils Absolute: 0 10*3/uL (ref 0.0–0.1)
EOS%: 4.9 % (ref 0.0–7.0)
Eosinophils Absolute: 0.2 10*3/uL (ref 0.0–0.5)
HCT: 41.7 % (ref 38.4–49.9)
HGB: 14.3 g/dL (ref 13.0–17.1)
LYMPH%: 30.7 % (ref 14.0–49.0)
MCH: 30.3 pg (ref 27.2–33.4)
MCHC: 34.3 g/dL (ref 32.0–36.0)
MCV: 88.3 fL (ref 79.3–98.0)
MONO#: 0.5 10*3/uL (ref 0.1–0.9)
MONO%: 10.5 % (ref 0.0–14.0)
NEUT#: 2.4 10*3/uL (ref 1.5–6.5)
NEUT%: 53.7 % (ref 39.0–75.0)
Platelets: 82 10*3/uL — ABNORMAL LOW (ref 140–400)
RBC: 4.72 10*6/uL (ref 4.20–5.82)
RDW: 14.6 % (ref 11.0–14.6)
WBC: 4.5 10*3/uL (ref 4.0–10.3)
lymph#: 1.4 10*3/uL (ref 0.9–3.3)

## 2015-01-13 LAB — IRON AND TIBC CHCC
%SAT: 27 % (ref 20–55)
Iron: 112 ug/dL (ref 42–163)
TIBC: 416 ug/dL — ABNORMAL HIGH (ref 202–409)
UIBC: 304 ug/dL (ref 117–376)

## 2015-01-13 LAB — FERRITIN CHCC: Ferritin: 28 ng/ml (ref 22–316)

## 2015-01-13 NOTE — Progress Notes (Signed)
Per email from 7/21, pt has tooth surgery 8/5 and inquired about having lab/phlebotomy on 8/4. Per response from [facility], "Per Dr Mosetta Putt, we will change lab & phlebotomy to 01/21/15. Expect a call from schedulers". No POF in, so pt arrived for labs 8/4. After reviewing labs and situation w/ Dr. Mosetta Putt, Per Dr. Mosetta Putt, it is okay to skip this month and pt should return in two months. Went to lobby to find patient, but could not find him. Erie Noe, Charge RN made aware of situation and to try and contact patient.   0940-Pt was found in lobby, okay with plan of coming back in two months. Scheduling to call patient with appts.

## 2015-01-13 NOTE — Telephone Encounter (Signed)
lvm for pt regarding to appts changed....mailed pt appt sched for Sept thru DEC

## 2015-01-14 ENCOUNTER — Encounter (HOSPITAL_COMMUNITY): Admission: RE | Payer: Self-pay | Source: Ambulatory Visit

## 2015-01-14 ENCOUNTER — Ambulatory Visit (HOSPITAL_COMMUNITY): Admission: RE | Admit: 2015-01-14 | Payer: Medicaid Other | Source: Ambulatory Visit | Admitting: Oral Surgery

## 2015-01-14 SURGERY — MULTIPLE EXTRACTION WITH ALVEOLOPLASTY
Anesthesia: General

## 2015-03-10 ENCOUNTER — Other Ambulatory Visit (HOSPITAL_BASED_OUTPATIENT_CLINIC_OR_DEPARTMENT_OTHER): Payer: Medicaid Other

## 2015-03-10 LAB — FERRITIN CHCC: Ferritin: 49 ng/ml (ref 22–316)

## 2015-03-10 LAB — CBC WITH DIFFERENTIAL/PLATELET
BASO%: 0.2 % (ref 0.0–2.0)
Basophils Absolute: 0 10*3/uL (ref 0.0–0.1)
EOS%: 4.9 % (ref 0.0–7.0)
Eosinophils Absolute: 0.3 10*3/uL (ref 0.0–0.5)
HCT: 42.3 % (ref 38.4–49.9)
HGB: 14.7 g/dL (ref 13.0–17.1)
LYMPH%: 30 % (ref 14.0–49.0)
MCH: 30.9 pg (ref 27.2–33.4)
MCHC: 34.8 g/dL (ref 32.0–36.0)
MCV: 88.9 fL (ref 79.3–98.0)
MONO#: 0.5 10*3/uL (ref 0.1–0.9)
MONO%: 9.9 % (ref 0.0–14.0)
NEUT%: 55 % (ref 39.0–75.0)
NEUTROS ABS: 3 10*3/uL (ref 1.5–6.5)
NRBC: 0 % (ref 0–0)
PLATELETS: 94 10*3/uL — AB (ref 140–400)
RBC: 4.76 10*6/uL (ref 4.20–5.82)
RDW: 16.8 % — ABNORMAL HIGH (ref 11.0–14.6)
WBC: 5.4 10*3/uL (ref 4.0–10.3)
lymph#: 1.6 10*3/uL (ref 0.9–3.3)

## 2015-03-10 LAB — IRON AND TIBC CHCC
%SAT: 59 % — ABNORMAL HIGH (ref 20–55)
Iron: 226 ug/dL — ABNORMAL HIGH (ref 42–163)
TIBC: 383 ug/dL (ref 202–409)
UIBC: 157 ug/dL (ref 117–376)

## 2015-03-16 ENCOUNTER — Telehealth: Payer: Self-pay | Admitting: Hematology

## 2015-03-16 NOTE — Telephone Encounter (Signed)
Returned patients call and we rescheduled his phlebot per his request

## 2015-03-17 ENCOUNTER — Telehealth: Payer: Self-pay | Admitting: Hematology

## 2015-03-17 ENCOUNTER — Ambulatory Visit (HOSPITAL_BASED_OUTPATIENT_CLINIC_OR_DEPARTMENT_OTHER): Payer: Medicaid Other

## 2015-03-17 ENCOUNTER — Other Ambulatory Visit: Payer: Medicaid Other

## 2015-03-17 DIAGNOSIS — Z23 Encounter for immunization: Secondary | ICD-10-CM

## 2015-03-17 MED ORDER — SODIUM CHLORIDE 0.9 % IV SOLN
INTRAVENOUS | Status: AC
  Administered 2015-03-17: 16:00:00 via INTRAVENOUS

## 2015-03-17 MED ORDER — INFLUENZA VAC SPLIT QUAD 0.5 ML IM SUSY
0.5000 mL | PREFILLED_SYRINGE | Freq: Once | INTRAMUSCULAR | Status: AC
  Administered 2015-03-17: 0.5 mL via INTRAMUSCULAR
  Filled 2015-03-17: qty 0.5

## 2015-03-17 NOTE — Patient Instructions (Signed)
Therapeutic Phlebotomy, Care After  Refer to this sheet in the next few weeks. These instructions provide you with information about caring for yourself after your procedure. Your health care provider may also give you more specific instructions. Your treatment has been planned according to current medical practices, but problems sometimes occur. Call your health care provider if you have any problems or questions after your procedure.  WHAT TO EXPECT AFTER THE PROCEDURE  After your procedure, it is common to have:   Light-headedness or dizziness. You may feel faint.   Nausea.   Tiredness.  HOME CARE INSTRUCTIONS  Activities   Return to your normal activities as directed by your health care provider. Most people can go back to their normal activities right away.   Avoid strenuous physical activity and heavy lifting or pulling for about 5 hours after the procedure. Do not lift anything that is heavier than 10 lb (4.5 kg).   Athletes should avoid strenuous exercise for at least 12 hours.   Change positions slowly for the remainder of the day. This will help to prevent light-headedness or fainting.   If you feel light-headed, lie down until the feeling goes away.  Eating and Drinking   Be sure to eat well-balanced meals for the next 24 hours.   Drink enough fluid to keep your urine clear or pale yellow.   Avoid drinking alcohol on the day that you had the procedure.  Care of the Needle Insertion Site   Keep your bandage dry. You can remove the bandage after about 5 hours or as directed by your health care provider.   If you have bleeding from the needle insertion site, elevate your arm and press firmly on the site until the bleeding stops.   If you have bruising at the site, apply ice to the area:   Put ice in a plastic bag.   Place a towel between your skin and the bag.   Leave the ice on for 20 minutes, 2-3 times a day for the first 24 hours.   If the swelling does not go away after 24 hours, apply  a warm, moist washcloth to the area for 20 minutes, 2-3 times a day.  General Instructions   Avoid smoking for at least 30 minutes after the procedure.   Keep all follow-up visits as directed by your health care provider. It is important to continue with further therapeutic phlebotomy treatments as directed.  SEEK MEDICAL CARE IF:   You have redness, swelling, or pain at the needle insertion site.   You have fluid, blood, or pus coming from the needle insertion site.   You feel light-headed, dizzy, or nauseated, and the feeling does not go away.   You notice new bruising at the needle insertion site.   You feel weaker than normal.   You have a fever or chills.  SEEK IMMEDIATE MEDICAL CARE IF:   You have severe nausea or vomiting.   You have chest pain.   You have trouble breathing.    This information is not intended to replace advice given to you by your health care provider. Make sure you discuss any questions you have with your health care provider.    Document Released: 10/30/2010 Document Revised: 10/12/2014 Document Reviewed: 05/24/2014  Elsevier Interactive Patient Education 2016 [employer]vier Inc[employer]

## 2015-03-17 NOTE — Telephone Encounter (Signed)
s.w. pt and confirmed echo....ok and aware

## 2015-03-17 NOTE — Progress Notes (Signed)
1620: 475 gm phlebotomy obtained from L antecubital via 16 gauge phlebotomy set. Pt reported he didn't "feel good" during phlebotomy. Discontinued early. Pressure dressing applied to site. Pt placed in reclined position, refreshments given. He reported feeling better immediately after phlebotomy discontinued.  BP lower when repeated post phlebotomy. Pt denies feeling dizzy or faint.  Discussed with Dr. Mosetta Putt, order received for IV fluids.

## 2015-03-23 ENCOUNTER — Ambulatory Visit (HOSPITAL_COMMUNITY)
Admission: RE | Admit: 2015-03-23 | Discharge: 2015-03-23 | Disposition: A | Discharge status: Home / Self Care | Payer: Medicaid Other | Source: Ambulatory Visit | Attending: Hematology | Admitting: Hematology

## 2015-03-23 DIAGNOSIS — E785 Hyperlipidemia, unspecified: Secondary | ICD-10-CM | POA: Insufficient documentation

## 2015-03-23 DIAGNOSIS — I081 Rheumatic disorders of both mitral and tricuspid valves: Secondary | ICD-10-CM | POA: Insufficient documentation

## 2015-03-23 DIAGNOSIS — D696 Thrombocytopenia, unspecified: Secondary | ICD-10-CM | POA: Insufficient documentation

## 2015-03-23 DIAGNOSIS — E119 Type 2 diabetes mellitus without complications: Secondary | ICD-10-CM | POA: Diagnosis not present

## 2015-03-23 NOTE — Progress Notes (Signed)
Echocardiogram 2D Echocardiogram has been performed.  Dorothey BasemanReel, Bernadette Armijo M 03/23/2015, 11:17 AM

## 2015-04-04 ENCOUNTER — Other Ambulatory Visit: Payer: Self-pay | Admitting: Neurology

## 2015-05-06 ENCOUNTER — Other Ambulatory Visit: Payer: Self-pay | Admitting: *Deleted

## 2015-05-06 ENCOUNTER — Other Ambulatory Visit (HOSPITAL_BASED_OUTPATIENT_CLINIC_OR_DEPARTMENT_OTHER): Payer: Medicaid Other

## 2015-05-06 LAB — CBC WITH DIFFERENTIAL/PLATELET
BASO%: 0.6 % (ref 0.0–2.0)
BASOS ABS: 0 10*3/uL (ref 0.0–0.1)
EOS ABS: 0.2 10*3/uL (ref 0.0–0.5)
EOS%: 5.3 % (ref 0.0–7.0)
HCT: 41.1 % (ref 38.4–49.9)
HEMOGLOBIN: 13.7 g/dL (ref 13.0–17.1)
LYMPH#: 1.3 10*3/uL (ref 0.9–3.3)
LYMPH%: 32.6 % (ref 14.0–49.0)
MCH: 31.6 pg (ref 27.2–33.4)
MCHC: 33.3 g/dL (ref 32.0–36.0)
MCV: 95 fL (ref 79.3–98.0)
MONO#: 0.4 10*3/uL (ref 0.1–0.9)
MONO%: 10.6 % (ref 0.0–14.0)
NEUT%: 50.9 % (ref 39.0–75.0)
NEUTROS ABS: 2 10*3/uL (ref 1.5–6.5)
Platelets: 90 10*3/uL — ABNORMAL LOW (ref 140–400)
RBC: 4.33 10*6/uL (ref 4.20–5.82)
RDW: 13.9 % (ref 11.0–14.6)
WBC: 3.9 10*3/uL — ABNORMAL LOW (ref 4.0–10.3)

## 2015-05-06 LAB — IRON AND TIBC CHCC
%SAT: 17 % — AB (ref 20–55)
Iron: 61 ug/dL (ref 42–163)
TIBC: 363 ug/dL (ref 202–409)
UIBC: 302 ug/dL (ref 117–376)

## 2015-05-06 LAB — FERRITIN CHCC: FERRITIN: 30 ng/mL (ref 22–316)

## 2015-05-10 ENCOUNTER — Ambulatory Visit (INDEPENDENT_AMBULATORY_CARE_PROVIDER_SITE_OTHER): Payer: Medicaid Other | Admitting: Adult Health

## 2015-05-10 ENCOUNTER — Encounter: Payer: Self-pay | Admitting: Adult Health

## 2015-05-10 VITALS — BP 113/69 | HR 64 | Ht 72.0 in | Wt 167.5 lb

## 2015-05-10 DIAGNOSIS — F959 Tic disorder, unspecified: Secondary | ICD-10-CM

## 2015-05-10 DIAGNOSIS — R251 Tremor, unspecified: Secondary | ICD-10-CM

## 2015-05-10 MED ORDER — GABAPENTIN 300 MG PO CAPS: 300.0000 mg | ORAL_CAPSULE | Freq: Every day | ORAL | Status: DC

## 2015-05-10 MED ORDER — GABAPENTIN 100 MG PO CAPS: 100.0000 mg | ORAL_CAPSULE | Freq: Two times a day (BID) | ORAL | Status: DC

## 2015-05-10 NOTE — Progress Notes (Signed)
PATIENT: John [name] DOB: 07-14-1955  REASON FOR VISIT: follow up-  Voice and motor tics HISTORY FROM: patient  HISTORY OF PRESENT ILLNESS:  [name] is a 59 year old [name] with a history of motor and voice tics. He returns today for follow-up. He is currently taking gabapentin 100 mg at 6 PM and 300 mg at bedtime. He reports that this is currently working well for him. He was unable to tolerate higher dosages of gabapentin due to sleepiness.  He states that there are days that he will shake so much that causes him to feel like he's been in a fight afterwards. However these episodes are intermittent and not consistent. He reports that the same thing happens at night occasionally. He states that he will move and jerk so much that he is unable to sleep. He states that this is also very sporadic and does not want to introduce any new medication to treat this. Patient reports that his last serum for hepatitis C was negative. Patient is very happy about this. He denies any new neurological symptoms. He returns today for an evaluation.  HISTORY (YY): [name] is a 59 years old Caucasian [name], referred by his primary care physician Dr. Renae [name] for evaluation of episodes of uncontrollable sudden body movement. Initial visit was in January 2015. 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 Around that time, he also developed episode of uncontrollable body jerking movement, making sound sometimes, it progressively worsened over the years, During interview, he had multiple episodes of uncontrollable sudden body flexion, making sound,  no loss of consciousness, He was previously diagnosed with motor tics, was given prescription of clonazepam, but he rarely tries it, worry about the side effect, clonazepam would put him into sleep, also worry about the side effect of addiction, liver damage. US 05/2013 showed Hepatic cirrhosis. Multiple echogenic nodules within a heterogenous liver parenchyma. Cholelithiasis with tumefactive sludge. Persistent gallbladder wall thickening is likely related to underlying liver disease. He was previously evaluated by neurologists at [name] for similar complains, was given prescription of gabapentin, which does help him some. He also complains of right hip pain, difficulty crossing his legs because of hip pain, radiating pain to his right groin area  UPDATE April 20th 2015:  He is with his girl friend [name] since 2002 at today's clinical visit. We have reviewed MRI of the brain which showed mild small vessel disease, no acute lesions, no significant basal ganglion signal changes X-ray of right hip was essentially normal, He was [name] in November 2013, was diagnosed with complex tic disorder, he was getting prescription of clonazepam, which was helping him, but again patient was complains about potential side effect. He has been followed up closely by GI specialist at [name], most recen visit was in April 14th 2015 by Dr. Jearld LeschPaul [name],planning on starting [name]. He was diagnosed with hepatitis C in 2006. He has [place] liver [place] He was later seen [facility]. He has never received hepatitis C treatment in part because he was concerned about the possible psychiatric side effects of interferon.He undergoes phlebotomy  every 2 weeks since 05/2013; he is seeing an outside [facility] at [name] in [place].He does not have hepatic encephalopathy or ascites. He takes lactulose prn for constipation. He is on nadolol for varices, but  has had no bleeding events.He responded very well to Neurontin 300 mg 3 times a day, but complains of tongue swelling with certain generic form of gabapentin   REVIEW OF SYSTEMS: Out of a complete 14 system review of symptoms, the patient complains only of the following symptoms, and all other reviewed systems are negative.   frequency of urination, weakness, aching muscles, muscle cramps, restless leg, daytime sleepiness, chills, fatigue  ALLERGIES: Allergies  Allergen Reactions  . Sulfa Antibiotics Swelling    Of tongue  . Sulfamethoxazole Swelling  . Testosterone Rash and Dermatitis    HOME MEDICATIONS: Outpatient Prescriptions Prior to Visit  Medication Sig Dispense Refill  . amoxicillin-clavulanate (AUGMENTIN) 875-125 MG per tablet Take 1 tablet by mouth 2 (two) times daily. Pt should finish med 01/11/15    . gabapentin (NEURONTIN) 100 MG capsule  at 6pm and  at hs 360 capsule 0  . glucagon 1 MG injection Inject 1 mg into the vein once as needed.    . insulin aspart (NOVOLOG) 100 UNIT/ML injection Inject 0-20 Units into the skin 3 (three) times daily before meals. Sliding scale    . insulin glargine (LANTUS) 100 UNIT/ML injection Inject 50 Units into the skin daily.     . Insulin Pen Needle (B-D ULTRAFINE III SHORT PEN) 31G X 8 MM MISC Injects 4 times daily    . ketoconazole (NIZORAL) 2 % cream Apply 1 application topically daily. (Patient taking differently: Apply 1 application topically daily as needed. ) 60 g 2  . lactulose (CHRONULAC) 10 GM/15ML solution Take 10 g by mouth daily as needed.     . propranolol (INDERAL) 40 MG tablet Take 40 mg by mouth 2 (two) times daily.     . Urea 39 % CREA Apply 1 application topically daily. (Patient taking differently: Apply 1 application topically daily as needed. ) 226.8 g 2   No facility-administered medications prior to visit.    PAST MEDICAL HISTORY: Past Medical History  Diagnosis Date  . Diabetes   . High cholesterol     . Anxiety   . Hepatitis C   . Foot pain left    to have MRI tues.     PAST SURGICAL HISTORY: Past Surgical History  Procedure Laterality Date  . None      FAMILY HISTORY: No family history on file.  SOCIAL HISTORY: Social History   Social History  . Marital Status: Single    Spouse Name: N/A  . Number of Children: 4  . Years of Education: 14   Occupational History  .      disabled   Social History Main Topics  . Smoking status: Never Smoker   . Smokeless tobacco: Never Used  . Alcohol Use: No     Comment: Quit 2007  . Drug Use: No     Comment: Quit 1980  . Sexual Activity: Not on file   Other Topics Concern  . Not on file   Social History Narrative   Patient lives at home with his girlfriend.    Patient is disabled.   Right handed.   Caffeine- 1-2 cups of caffeine daily.   Education- two years of college.          PHYSICAL EXAM  Filed Vitals:   05/10/15  0820  BP: 113/69  Pulse: 64  Height: 6' (1.829 m)  Weight: 167 lb 8 oz (75.978 kg)   Body mass index is 22.71 kg/(m^2).  Generalized: Well developed, in no acute distress   Neurological examination  Mentation: Alert oriented to time, place, history taking. Follows all commands speech and language fluent Cranial nerve II-XII: Pupils were equal round reactive to light. Extraocular movements were full, visual field were full on confrontational test. Facial sensation and strength were normal. Uvula tongue midline. Head turning and shoulder shrug  were normal and symmetric. Motor: The motor testing reveals 5 over 5 strength of all 4 extremities. Good symmetric motor tone is noted throughout.  Sensory: Sensory testing is intact to soft touch on all 4 extremities. No evidence of extinction is noted.  Coordination: Cerebellar testing reveals good finger-nose-finger and heel-to-shin bilaterally.  Gait and station: Gait is normal. Tandem gait is normal. Romberg is negative. No drift is seen.  Reflexes:  Deep tendon reflexes are symmetric and normal bilaterally.   DIAGNOSTIC DATA (LABS, IMAGING, TESTING) - I reviewed patient records, labs, notes, testing and imaging myself where available.  Lab Results  Component Value Date   WBC 3.9* 05/06/2015   HGB 13.7 05/06/2015   HCT 41.1 05/06/2015   MCV 95.0 05/06/2015   PLT 90* 05/06/2015    ASSESSMENT AND PLAN 59 y.o. year old [name]  has a past medical history of Diabetes; High cholesterol; Anxiety; Hepatitis C; and Foot pain (left). here with:   1. Motor tics   Overall the patient is doing well.  The patient is willing to try to increase gabapentin again. We will add 100 mg tablet to the morning and he will continue taking 100 mg at 6 PM. The patient has requested that I order him to 300 mg tablets to take at bedtime. I have sent this prescription in.  So for now the patient will take 100 mg tablet in the morning, 100 mg tablet at 6 PM and 300 mg tablet at bedtime. Patient advised that if he is unable to tolerate the additional 100 mg he should let us know. He will follow-up in 6 months with Dr. Terrace Arabia.   Butch Penny, MSN, NP-C 05/10/2015, 8:21 AM [facility] [address], [address] [place], [place] [address] [phone]

## 2015-05-10 NOTE — Patient Instructions (Signed)
Increase gabapentin 100 mg in the morning, 100 mg at 6PM and 300 mg at bedtime.  If your symptoms worsen or you develop new symptoms please let us know.

## 2015-05-10 NOTE — Progress Notes (Signed)
I have reviewed and agreed above plan. 

## 2015-05-11 ENCOUNTER — Ambulatory Visit: Payer: Medicaid Other | Admitting: Adult Health

## 2015-05-12 ENCOUNTER — Encounter: Payer: Self-pay | Admitting: Hematology

## 2015-05-12 ENCOUNTER — Telehealth: Payer: Self-pay | Admitting: Hematology

## 2015-05-12 ENCOUNTER — Other Ambulatory Visit: Payer: Medicaid Other

## 2015-05-12 ENCOUNTER — Ambulatory Visit (HOSPITAL_BASED_OUTPATIENT_CLINIC_OR_DEPARTMENT_OTHER): Payer: Medicaid Other | Admitting: Hematology

## 2015-05-12 DIAGNOSIS — B1920 Unspecified viral hepatitis C without hepatic coma: Secondary | ICD-10-CM | POA: Diagnosis not present

## 2015-05-12 DIAGNOSIS — D6959 Other secondary thrombocytopenia: Secondary | ICD-10-CM

## 2015-05-12 NOTE — Progress Notes (Signed)
[facility] HEMATOLOGY OFFICE PROGRESS NOTE  DIAGNOSIS:  1.Hemochromatosis diagnosed in 2007 in [place]  2. 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   PROBLEM LIST 1. Hemochromatosis, diagnosed in 2007 in [place]. 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  2. Diabetes, diagnosed in 2007, managed by his endocrinologist at [facility]. 3. Hepatitis C and liver cirrhosis. This is managed by Dr. Gardiner Coins and Dr. Donnal Debar  at [facility]. He received and anti-hepatitis C treatment in 2015 and his virus title became undetectable afterwards. 4. History of heart failure.  INTERIM HISTORY: He return for follow up. He is doing well overall. He had left rib fracture 2 months ago after bending over to pick up something and bumped to the driver's seat. This has healed and his chest pain for nearly resolved 2 weeks ago. He also had a few teeth extracted in the past few months. He is otherwise doing well, denies any significant pain, dyspnea, leg swollen or other symptoms. He has been very compliant with his lab work and a phlebotomy every 2 months.   MEDICAL HISTORY: Past Medical History  Diagnosis Date  . Diabetes (HCC)   . High cholesterol   . Anxiety   . Hepatitis C   . Foot pain left    to have MRI tues.   Hemochromatosis (Iron Overload)  ALLERGIES:  is allergic to sulfa antibiotics; sulfamethoxazole; and testosterone.  MEDICATIONS:    Medication List       This list is accurate as of: 05/12/15 11:13 AM.  Always use your most recent med list.               amoxicillin-clavulanate 875-125 MG tablet  Commonly known as:  AUGMENTIN  Take 1 tablet by mouth 2 (two) times daily. Pt should finish med 01/11/15     B-D ULTRAFINE III SHORT PEN 31G X 8 MM Misc   Generic drug:  Insulin Pen Needle  Injects 4 times daily     gabapentin 100 MG capsule  Commonly known as:  NEURONTIN  Take 1 capsule (100 mg total) by mouth 2 (two) times daily.     gabapentin 300 MG capsule  Commonly known as:  NEURONTIN  Take 1 capsule (300 mg total) by mouth at bedtime.     glucagon 1 MG injection  Inject 1 mg into the vein once as needed.     insulin aspart 100 UNIT/ML injection  Commonly known as:  novoLOG  Inject 0-20 Units into the skin 3 (three) times daily before meals. Sliding scale     insulin glargine 100 UNIT/ML injection  Commonly known as:  LANTUS  Inject 50 Units into the skin daily.     lactulose 10 GM/15ML solution  Commonly known as:  CHRONULAC  Take 10 g by mouth daily as needed.     propranolol 40 MG tablet  Commonly known as:  INDERAL  Take 40 mg by mouth 2 (two) times daily.     Urea 39 % Crea  Apply 1 application topically daily.        SURGICAL HISTORY:  Past Surgical History  Procedure Laterality Date  . None      REVIEW OF SYSTEMS:   Constitutional: Denies fevers, chills or abnormal weight loss Eyes: Denies blurriness of vision  Ears, nose, mouth, throat, and face: Denies mucositis or sore throat Respiratory: Denies cough, dyspnea or wheezes Cardiovascular: Denies palpitation, chest discomfort or lower extremity swelling Gastrointestinal:  Denies nausea, heartburn or change in bowel habits Skin: Denies abnormal skin rashes Lymphatics: Denies new lymphadenopathy or easy bruising Neurological:Denies numbness, tingling or new weaknesses Behavioral/Psych: Mood is stable, no new changes  All other systems were reviewed with the patient and are negative.  PHYSICAL EXAMINATION: ECOG PERFORMANCE STATUS: 0  Blood pressure 101/63, pulse 65, temperature 98 F (36.7 C), temperature source Oral, resp. rate 18, height 6' (1.829 m), weight 168 lb (76.204 kg), SpO2 97 %.  GENERAL:alert, no distress and comfortable; bearded  thin male.  SKIN: skin color, texture, turgor are normal, no rashes or significant lesions EYES: normal, Conjunctiva are pink and non-injected, sclera clear OROPHARYNX:no exudate, no erythema and lips, buccal mucosa, and tongue normal  NECK: supple, thyroid normal size, non-tender, without nodularity LYMPH:  no palpable lymphadenopathy in the cervical, axillary or supraclavicular LUNGS: clear to auscultation and percussion with normal breathing effort HEART: regular rate & rhythm and no murmurs and no lower extremity edema ABDOMEN:abdomen soft, non-tender and normal bowel sounds; enlarged spleen about 2 cm below costaphrenic angle. No additional stigmata of liver disease.  Musculoskeletal:no cyanosis of digits and no clubbing  NEURO: alert & oriented x 3 with fluent speech, no focal motor/sensory deficits  LABORATORY DATA: Labs:  CBC Latest Ref Rng 05/06/2015 03/10/2015 01/13/2015  WBC 4.0 - 10.3 10e3/uL 3.9(L) 5.4 4.5  Hemoglobin 13.0 - 17.1 g/dL 08.613.7 57.814.7 46.914.3  Hematocrit 38.4 - 49.9 % 41.1 42.3 41.7  Platelets 140 - 400 10e3/uL 90(L) 94(L) 82(L)    CMP Latest Ref Rng 05/13/2014 01/28/2014 10/07/2013  Glucose 70 - 140 mg/dl 629(B177(H) 284(X215(H) 324(M356(H)  BUN 7.0 - 26.0 mg/dL 01.010.8 27.213.7 53.611.9  Creatinine 0.7 - 1.3 mg/dL 0.8 0.9 0.9  Sodium 644136 - 145 mEq/L 138 136 133(L)  Potassium 3.5 - 5.1 mEq/L 4.0 3.9 4.2  Chloride 96 - 112 mEq/L - - -  CO2 22 - 29 mEq/L 29 28 23   Calcium 8.4 - 10.4 mg/dL 9.1 8.8 8.4  Total Protein 6.4 - 8.3 g/dL 7.5 6.7 7.0  Total Bilirubin 0.20 - 1.20 mg/dL 0.340.41 7.421.02 5.950.48  Alkaline Phos 40 - 150 U/L 142 130 170(H)  AST 5 - 34 U/L 52(H) 32 131(H)  ALT 0 - 55 U/L 41 19 88(H)   Results for [name], Ed R (MRN [id number]) as of 05/12/2015 11:13  Ref. Range 05/06/2015 09:36  Iron Latest Ref Range: 42-163 ug/dL 61  UIBC Latest Ref Range: 117-376 ug/dL 295302  TIBC Latest Ref Range: 202-409 ug/dL 188363  %SAT Latest Ref Range: 20-55 % 17 (L)  Ferritin Latest Ref Range: 22-316 ng/ml  30       RADIOGRAPHIC STUDIES: MR BRAIN WO CONTRAST 09/21/2013  IMPRESSION: Mildly abnormal MRI brain (without) demonstrating: 1. Scattered periventricular, subcortical and juxtacortical foci of non-specific T2 hyperintensities. These findings are non-specific and considerations include autoimmune, inflammatory, post-infectious, microvascular ischemic or migraine associated etiologies. 2. Slightly hyperintense T1 signal in the bilateral globus pallidi. This is non-specific but can be seen in association with liver disease and mineral deposition. 3. No acute findings   ASSESSMENT: John Revellvin R Quashie 59 y.o. male with a history of Hepatitis C and Hemochromatosis.   PLAN:  1. Hereditary hemochromatosis.  --We discuss the goal is maintaining his ferritin less than 50 and transferrin saturation <50%).  -He has been very compliant with  lab and phlebotomy as needed every 2 months -His ferritin and iron study from last week were normal, no need for phlebotomy today. -We discussed low iron diet. He has cut back of red meat intake, I encourage him to eat last red meat, and consider chicken, [place], seafood etc. as his main protein resource.  2. Cytopenia His mild anemia is likely secondary to phlebotomy and iron deficiency, resolved now  His thrombocytopenia is likely secondary to liver cirrhosis and splenmagely, very stable, no signs of bleeding. I'll watch closely.  3. DM2 secondary #1.  --Continue insulin sliding scale and follow-up with [facility].   4. 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  --He follows at with DR. Bruggen every 6 months at [facility] and finished treatment for HepC in 2015 --Continue nadolol. He was counseled to report any bleeding.  --His HCV level is non-detected (labs in Care Everywhere) with normalization of his transiminases.  -His is under [facility] surveillance by ultrasound and lab every 6 months by Dr.  Donnal Debar  5 history of CHF -I discussed his echo results from October 2016, which showed normal EF and normal structure.   Follow-up.  -no Phlebotomy today   -Repeat a CBC, iron study every 2 months, phlebotomy if ferritin > 50 or transferrin saturation >50% . He will call us after lab to set up for phlebotomy if needed. -I'll see him back in 6 months.   All questions were answered. The patient knows to call the clinic with any problems, questions or concerns. We can certainly see the patient much sooner if necessary.  I spent 20 minutes counseling the patient face to face. The total time spent in the appointment was 30 minutes.  Malachy Mood 05/12/2015

## 2015-05-12 NOTE — Telephone Encounter (Signed)
Gave and printed appt sched and avs for pt for Feb, April and June  °

## 2015-06-01 DIAGNOSIS — F4323 Adjustment disorder with mixed anxiety and depressed mood: Secondary | ICD-10-CM | POA: Insufficient documentation

## 2015-07-14 ENCOUNTER — Other Ambulatory Visit (HOSPITAL_BASED_OUTPATIENT_CLINIC_OR_DEPARTMENT_OTHER): Payer: Medicaid Other

## 2015-07-14 LAB — IRON AND TIBC
%SAT: 62 % — ABNORMAL HIGH (ref 20–55)
Iron: 222 ug/dL — ABNORMAL HIGH (ref 42–163)
TIBC: 356 ug/dL (ref 202–409)
UIBC: 135 ug/dL (ref 117–376)

## 2015-07-14 LAB — FERRITIN: Ferritin: 42 ng/ml (ref 22–316)

## 2015-07-20 ENCOUNTER — Encounter: Payer: Self-pay | Admitting: Hematology

## 2015-07-22 ENCOUNTER — Other Ambulatory Visit: Payer: Self-pay

## 2015-07-25 ENCOUNTER — Telehealth: Payer: Self-pay

## 2015-07-25 NOTE — Telephone Encounter (Signed)
Spoke with patient and he is aware of his appointments per pof,he states that he should not have to have a visit and i have asked him to call back 2/14 and speak with dr Georga Bora nurse   Thurston Hole

## 2015-07-26 ENCOUNTER — Other Ambulatory Visit: Payer: Self-pay | Admitting: *Deleted

## 2015-07-26 ENCOUNTER — Other Ambulatory Visit: Payer: Self-pay | Admitting: Hematology

## 2015-07-26 ENCOUNTER — Telehealth: Payer: Self-pay | Admitting: *Deleted

## 2015-07-26 NOTE — Telephone Encounter (Signed)
Spoke with pt today and gave pt appt date and time for phlebotomy only on 08/02/15 at 0945 am.  Per Dr. Latanya Maudlin last office notes, pt to call office for phlebotomy appt only  after each lab test.  Pt already has returned office visit appt with Dr. Mosetta Putt on 11/10/15.  Pt voiced understanding.

## 2015-07-26 NOTE — Telephone Encounter (Signed)
Per desk RN I have moved appt to earlier on 2/21. Desk RN to call the patient.   JMW

## 2015-07-31 ENCOUNTER — Other Ambulatory Visit: Payer: Self-pay | Admitting: Hematology

## 2015-08-01 ENCOUNTER — Encounter: Payer: Self-pay | Admitting: Hematology

## 2015-08-02 ENCOUNTER — Ambulatory Visit: Payer: Medicaid Other | Admitting: Hematology

## 2015-08-02 ENCOUNTER — Ambulatory Visit (HOSPITAL_BASED_OUTPATIENT_CLINIC_OR_DEPARTMENT_OTHER): Payer: Medicaid Other

## 2015-08-02 ENCOUNTER — Ambulatory Visit: Payer: Medicaid Other

## 2015-08-02 LAB — CBC WITH DIFFERENTIAL/PLATELET
BASO%: 0.3 % (ref 0.0–2.0)
BASOS ABS: 0 10*3/uL (ref 0.0–0.1)
EOS ABS: 0.1 10*3/uL (ref 0.0–0.5)
EOS%: 2.1 % (ref 0.0–7.0)
HEMATOCRIT: 47.2 % (ref 38.4–49.9)
HEMOGLOBIN: 15.6 g/dL (ref 13.0–17.1)
LYMPH#: 1.6 10*3/uL (ref 0.9–3.3)
LYMPH%: 24.5 % (ref 14.0–49.0)
MCH: 30.4 pg (ref 27.2–33.4)
MCHC: 33 g/dL (ref 32.0–36.0)
MCV: 92.3 fL (ref 79.3–98.0)
MONO#: 0.6 10*3/uL (ref 0.1–0.9)
MONO%: 9.8 % (ref 0.0–14.0)
NEUT#: 4.1 10*3/uL (ref 1.5–6.5)
NEUT%: 63.3 % (ref 39.0–75.0)
PLATELETS: 125 10*3/uL — AB (ref 140–400)
RBC: 5.11 10*6/uL (ref 4.20–5.82)
RDW: 14.8 % — AB (ref 11.0–14.6)
WBC: 6.6 10*3/uL (ref 4.0–10.3)

## 2015-08-02 LAB — IRON AND TIBC
%SAT: 46 % (ref 20–55)
IRON: 184 ug/dL — AB (ref 42–163)
TIBC: 404 ug/dL (ref 202–409)
UIBC: 220 ug/dL (ref 117–376)

## 2015-08-02 LAB — FERRITIN: Ferritin: 39 ng/ml (ref 22–316)

## 2015-08-02 NOTE — Progress Notes (Unsigned)
VSS post Phlebotomy, denies dizziness or feeling faint. Dressing changed on Lt AC site. Site is CDI upon D/C. Left unit via wheelchair.

## 2015-08-02 NOTE — Patient Instructions (Signed)

## 2015-08-02 NOTE — Progress Notes (Signed)
Therapeutic phlebotomy performed using 16G needle to L AC.  Patient tolerated well, 554 grams yielded from 1037 to 1044.  He is having a meal now and remains alert and oriented and will stay for observation and VS in 30 mins.

## 2015-08-03 ENCOUNTER — Other Ambulatory Visit: Payer: Self-pay | Admitting: Hematology

## 2015-08-05 ENCOUNTER — Other Ambulatory Visit: Payer: Self-pay | Admitting: *Deleted

## 2015-08-05 ENCOUNTER — Telehealth: Payer: Self-pay | Admitting: Hematology

## 2015-08-05 NOTE — Telephone Encounter (Signed)
Per MD 2/22 pof, I cxd and r/s appt date/time. Patient will be informed of changes by team lead.

## 2015-08-08 ENCOUNTER — Telehealth: Payer: Self-pay | Admitting: Hematology

## 2015-08-08 NOTE — Telephone Encounter (Signed)
Per 2/22 pof April phlebotomy cxd - f/u visit moved to June with labs one week prior. Spoke with patient re changes and new appointments for 5/25 and 6/1 - patient aware and ok with change and per patient he has also seen appointments on my chart.

## 2015-09-15 ENCOUNTER — Other Ambulatory Visit: Payer: Medicaid Other

## 2015-09-22 ENCOUNTER — Other Ambulatory Visit: Payer: Medicaid Other

## 2015-09-29 ENCOUNTER — Other Ambulatory Visit: Payer: Medicaid Other

## 2015-09-29 ENCOUNTER — Ambulatory Visit: Payer: Medicaid Other | Admitting: Hematology

## 2015-10-06 ENCOUNTER — Encounter (HOSPITAL_BASED_OUTPATIENT_CLINIC_OR_DEPARTMENT_OTHER)
Admission: RE | Disposition: A | Discharge status: Home / Self Care | Payer: Self-pay | Source: Ambulatory Visit | Attending: Orthopedic Surgery

## 2015-10-06 ENCOUNTER — Ambulatory Visit (HOSPITAL_BASED_OUTPATIENT_CLINIC_OR_DEPARTMENT_OTHER)
Admission: RE | Admit: 2015-10-06 | Discharge: 2015-10-06 | Disposition: A | Discharge status: Home / Self Care | Payer: Medicaid Other | Source: Ambulatory Visit | Attending: Orthopedic Surgery | Admitting: Orthopedic Surgery

## 2015-10-06 SURGERY — INCISION AND DRAINAGE, ABSCESS
Anesthesia: Choice | Laterality: Right

## 2015-10-06 NOTE — H&P (Signed)
[name] is an 60 y.o. male.   Chief Complaint: right index finger laceration HPI: 60 yo rhd male states he fell in garage 4 days ago and lacerated right index finger on a board with nails in it.  Nails were new.  Did not seek medical attention at the time.  Seen by pcp this morning and felt to have potential infection.  Referred for further evaluation.  He reports no previous injury to right hand and no other injury at this time.  No fevers, chills, night sweats.  Allergies:  Allergies  Allergen Reactions  . Sulfa Antibiotics Swelling    Of tongue  . Sulfamethoxazole Swelling  . Testosterone Rash and Dermatitis    Past Medical History  Diagnosis Date  . Diabetes (HCC)   . High cholesterol   . Anxiety   . Hepatitis C   . Foot pain left    to have MRI tues.     Past Surgical History  Procedure Laterality Date  . None      Family History: No family history on file.  Social History:   reports that he has never smoked. He has never used smokeless tobacco. He reports that he does not drink alcohol or use illicit drugs.  Medications: Medications Prior to Admission  Medication Sig Dispense Refill  . amoxicillin-clavulanate (AUGMENTIN) 875-125 MG per tablet Take 1 tablet by mouth 2 (two) times daily. Pt should finish med 01/11/15    . gabapentin (NEURONTIN) 100 MG capsule Take 1 capsule (100 mg total) by mouth 2 (two) times daily. 60 capsule 5  . gabapentin (NEURONTIN) 300 MG capsule Take 1 capsule (300 mg total) by mouth at bedtime. 30 capsule 5  . glucagon 1 MG injection Inject 1 mg into the vein once as needed.    . insulin aspart (NOVOLOG) 100 UNIT/ML injection Inject 0-20 Units into the skin 3 (three) times daily before meals. Sliding scale    . insulin glargine (LANTUS) 100 UNIT/ML injection Inject 50 Units into the skin daily.     . Insulin Pen Needle (B-D ULTRAFINE III SHORT PEN) 31G X 8 MM MISC Injects 4 times daily    . lactulose (CHRONULAC) 10 GM/15ML solution Take  10 g by mouth daily as needed.     . propranolol (INDERAL) 40 MG tablet Take 40 mg by mouth 2 (two) times daily.     . Urea 39 % CREA Apply 1 application topically daily. (Patient taking differently: Apply 1 application topically daily as needed. ) 226.8 g 2    No results found for this or any previous visit (from the past 48 hour(s)).  No results found.   A comprehensive review of systems was negative.  There were no vitals taken for this visit.  General appearance: alert, cooperative and appears stated age Head: Normocephalic, without obvious abnormality, atraumatic Neck: supple, symmetrical, trachea midline Extremities: Intact sensation and capillary refill all digits.  +epl/fpl/io.  Laceration of right index finger distal to pip joint.  Distally based flap.  Irritational rubor at proxima end of flap.  No erythema.  No purulence.  No fluctuance.  No proximal streaking.  Able to flex at pip and dip joints.  Mild swelling.  No tenderness over proximal phalanx or mp joint. Pulses: 2+ and symmetric Skin: Skin color, texture, turgor normal. No rashes or lesions Neurologic: Grossly normal Incision/Wound:as above  Assessment/Plan Right index finger laceration.  Will start on doxycycline 100 mg po bid x 7 days.  Daily hypertonic  saline soaks and dressing changes.  Avoid submersion of wound in anything other than saline soaks.  Ibuprofen for pain.  Follow up Monday.  Instructed in signs of infection to watch for and will call if any signs of infection develop.  He and his family member agree with the plan of care.  Shaye Lagace R 10/06/2015, 12:32 PM

## 2015-10-27 ENCOUNTER — Ambulatory Visit (INDEPENDENT_AMBULATORY_CARE_PROVIDER_SITE_OTHER): Payer: Medicaid Other | Admitting: Podiatry

## 2015-10-27 DIAGNOSIS — M79676 Pain in unspecified toe(s): Secondary | ICD-10-CM | POA: Diagnosis not present

## 2015-10-27 DIAGNOSIS — E1040 Type 1 diabetes mellitus with diabetic neuropathy, unspecified: Secondary | ICD-10-CM | POA: Diagnosis not present

## 2015-10-27 DIAGNOSIS — B351 Tinea unguium: Secondary | ICD-10-CM

## 2015-10-27 DIAGNOSIS — M2042 Other hammer toe(s) (acquired), left foot: Secondary | ICD-10-CM

## 2015-10-27 NOTE — Progress Notes (Signed)
Subjective:     Patient ID: John [name], male   DOB: 10/26/1955, 60 y.o.   MRN: [id number]  HPI this patient presents to the office with chief complaint of pain noted under the fourth toe of the left foot. He says there is no pain or discomfort today. There is occasional pain when he walks and worse tissues under the fourth toe. Patient does relate that the fourth toe appears to be deviated curving under the third toe. He also is interested in receiving another pair of diabetic shoes from [employer] patient has no other complaints in his feet at this time. Patient is a diabetic who was was recorded with an A1c of 9 months ago, but he says now his blood sugar is 101 daily   Review of Systems     Objective:   Physical Exam GENERAL APPEARANCE: Alert, conversant. Appropriately groomed. No acute distress.  VASCULAR: Pedal pulses are  palpable at  [facility] bilateral.  Capillary refill time is immediate to all digits,  Normal temperature gradient.    NEUROLOGIC: sensation is normal to 5.07 monofilament at 5/5 sites bilateral.  Light touch is intact bilateral, Muscle strength normal.  MUSCULOSKELETAL: acceptable muscle strength, tone and stability bilateral.  Intrinsic muscluature intact bilateral.  Rectus appearance of foot and digits noted bilateral. Exostosis midfoot B/L.  DERMATOLOGIC: skin color, texture, and turgor are within normal limits.  No preulcerative lesions or ulcers  are seen, no interdigital maceration noted.  No open lesions present.  Digital nails are asymptomatic. No drainage noted. Plantar tyloma sub 5th metatarsal left foot. Callus under fourth toe left foot.      Assessment:     Hammer toe fourth toe left foot. Diabetes with no complications    Plan:    ROV.  Prescribed diabetic shoes to be brought to [employer].  RTC prn.   Helane GuntherGregory Akylah Hascall [name]

## 2015-11-03 ENCOUNTER — Other Ambulatory Visit (HOSPITAL_BASED_OUTPATIENT_CLINIC_OR_DEPARTMENT_OTHER): Payer: Medicaid Other

## 2015-11-03 ENCOUNTER — Telehealth: Payer: Self-pay | Admitting: *Deleted

## 2015-11-03 LAB — CBC WITH DIFFERENTIAL/PLATELET
BASO%: 0 % (ref 0.0–2.0)
Basophils Absolute: 0 10*3/uL (ref 0.0–0.1)
EOS%: 0.3 % (ref 0.0–7.0)
Eosinophils Absolute: 0 10*3/uL (ref 0.0–0.5)
HEMATOCRIT: 38.3 % — AB (ref 38.4–49.9)
HGB: 13.3 g/dL (ref 13.0–17.1)
LYMPH#: 1.4 10*3/uL (ref 0.9–3.3)
LYMPH%: 18.1 % (ref 14.0–49.0)
MCH: 30.9 pg (ref 27.2–33.4)
MCHC: 34.7 g/dL (ref 32.0–36.0)
MCV: 88.9 fL (ref 79.3–98.0)
MONO#: 0.6 10*3/uL (ref 0.1–0.9)
MONO%: 7.8 % (ref 0.0–14.0)
NEUT#: 5.6 10*3/uL (ref 1.5–6.5)
NEUT%: 73.8 % (ref 39.0–75.0)
Platelets: 101 10*3/uL — ABNORMAL LOW (ref 140–400)
RBC: 4.31 10*6/uL (ref 4.20–5.82)
RDW: 15.8 % — ABNORMAL HIGH (ref 11.0–14.6)
WBC: 7.6 10*3/uL (ref 4.0–10.3)

## 2015-11-03 LAB — IRON AND TIBC
%SAT: 16 % — ABNORMAL LOW (ref 20–55)
IRON: 56 ug/dL (ref 42–163)
TIBC: 344 ug/dL (ref 202–409)
UIBC: 288 ug/dL (ref 117–376)

## 2015-11-03 LAB — FERRITIN: Ferritin: 32 ng/ml (ref 22–316)

## 2015-11-03 NOTE — Telephone Encounter (Signed)
Called pt and spoke with wife.  Informed wife that per Dr. Mosetta PuttFeng - iron level is normal, no need for phlebotomy.  Wife voiced understanding, and stated she would relay message to pt.

## 2015-11-03 NOTE — Telephone Encounter (Signed)
"  I'm here for lab.  Dr. Mosetta PuttFeng will have my results by tomorrow.  I'd like a Phlebotomy Monday if needed.  I will be going out of the country June 2nd after F/U with her on November 10, 2015 is why I want to proceed with Phlebotomy.  Return number [phone]."

## 2015-11-03 NOTE — Telephone Encounter (Signed)
Thu,   Please let patient knows that his iron level is normal, no need for phlebotomy.  [name], [name] 11/03/2015

## 2015-11-08 ENCOUNTER — Telehealth: Payer: Self-pay | Admitting: *Deleted

## 2015-11-08 ENCOUNTER — Ambulatory Visit: Payer: Medicaid Other | Admitting: Neurology

## 2015-11-08 NOTE — Telephone Encounter (Signed)
Arrived to follow up appointment without copay.

## 2015-11-10 ENCOUNTER — Encounter: Payer: Medicaid Other | Admitting: Hematology

## 2015-11-10 ENCOUNTER — Encounter: Payer: Self-pay | Admitting: Hematology

## 2015-11-10 ENCOUNTER — Other Ambulatory Visit: Payer: Medicaid Other

## 2015-11-10 DIAGNOSIS — K7460 Unspecified cirrhosis of liver: Secondary | ICD-10-CM | POA: Insufficient documentation

## 2015-11-10 NOTE — Progress Notes (Signed)
This encounter was created in error - please disregard.

## 2015-11-11 ENCOUNTER — Telehealth: Payer: Self-pay | Admitting: Hematology

## 2015-11-11 NOTE — Telephone Encounter (Signed)
per pfo to sch pt appt-pt has MY CHART and will get updated appt per req

## 2015-12-04 ENCOUNTER — Other Ambulatory Visit: Payer: Self-pay | Admitting: Adult Health

## 2015-12-05 NOTE — Telephone Encounter (Signed)
I called and LMVM for pt to return call for f/u appt.  Last seen 04/2015 by MM/NP.  To see Dr. Terrace ArabiaYan in 6 mo 11-08-15 (left as no co pay).  No appt rescheduled.  Received request for gabapentin.  Taking 100mg  am, noon and 300mg  at bedtime.

## 2016-01-06 ENCOUNTER — Telehealth: Payer: Self-pay | Admitting: Hematology

## 2016-01-06 NOTE — Telephone Encounter (Signed)
pt spouse cld to r/s appt-gave updated time & date °

## 2016-01-16 ENCOUNTER — Other Ambulatory Visit: Payer: Medicaid Other

## 2016-01-16 ENCOUNTER — Ambulatory Visit: Payer: Medicaid Other | Admitting: Hematology

## 2016-02-12 ENCOUNTER — Other Ambulatory Visit: Payer: Self-pay | Admitting: Adult Health

## 2016-02-20 ENCOUNTER — Ambulatory Visit (HOSPITAL_BASED_OUTPATIENT_CLINIC_OR_DEPARTMENT_OTHER): Payer: Medicaid Other | Admitting: Hematology

## 2016-02-20 ENCOUNTER — Telehealth: Payer: Self-pay | Admitting: Hematology

## 2016-02-20 ENCOUNTER — Ambulatory Visit (HOSPITAL_COMMUNITY)
Admission: RE | Admit: 2016-02-20 | Discharge: 2016-02-20 | Disposition: A | Discharge status: Home / Self Care | Payer: Medicaid Other | Source: Ambulatory Visit | Attending: Hematology | Admitting: Hematology

## 2016-02-20 ENCOUNTER — Encounter: Payer: Self-pay | Admitting: Hematology

## 2016-02-20 ENCOUNTER — Other Ambulatory Visit (HOSPITAL_BASED_OUTPATIENT_CLINIC_OR_DEPARTMENT_OTHER): Payer: Medicaid Other

## 2016-02-20 DIAGNOSIS — B182 Chronic viral hepatitis C: Secondary | ICD-10-CM | POA: Diagnosis not present

## 2016-02-20 DIAGNOSIS — R05 Cough: Secondary | ICD-10-CM | POA: Insufficient documentation

## 2016-02-20 DIAGNOSIS — K7469 Other cirrhosis of liver: Secondary | ICD-10-CM | POA: Diagnosis not present

## 2016-02-20 DIAGNOSIS — R059 Cough, unspecified: Secondary | ICD-10-CM

## 2016-02-20 DIAGNOSIS — E119 Type 2 diabetes mellitus without complications: Secondary | ICD-10-CM | POA: Diagnosis not present

## 2016-02-20 DIAGNOSIS — J42 Unspecified chronic bronchitis: Secondary | ICD-10-CM | POA: Diagnosis not present

## 2016-02-20 DIAGNOSIS — D696 Thrombocytopenia, unspecified: Secondary | ICD-10-CM | POA: Diagnosis not present

## 2016-02-20 LAB — IRON AND TIBC
%SAT: >100 % (ref 20–?)
Iron: 349 ug/dL — ABNORMAL HIGH (ref 42–163)
TIBC: 341 ug/dL (ref 202–409)
UIBC: <1 ug/dL (ref 117–376)

## 2016-02-20 LAB — CBC WITH DIFFERENTIAL/PLATELET
BASO%: 0.2 % (ref 0.0–2.0)
BASOS ABS: 0 10*3/uL (ref 0.0–0.1)
EOS%: 2.7 % (ref 0.0–7.0)
Eosinophils Absolute: 0.1 10*3/uL (ref 0.0–0.5)
HCT: 42.9 % (ref 38.4–49.9)
HEMOGLOBIN: 14.9 g/dL (ref 13.0–17.1)
LYMPH#: 0.7 10*3/uL — AB (ref 0.9–3.3)
LYMPH%: 16.6 % (ref 14.0–49.0)
MCH: 31.8 pg (ref 27.2–33.4)
MCHC: 34.7 g/dL (ref 32.0–36.0)
MCV: 91.7 fL (ref 79.3–98.0)
MONO#: 0.4 10*3/uL (ref 0.1–0.9)
MONO%: 9.2 % (ref 0.0–14.0)
NEUT%: 71.3 % (ref 39.0–75.0)
NEUTROS ABS: 3.2 10*3/uL (ref 1.5–6.5)
PLATELETS: 93 10*3/uL — AB (ref 140–400)
RBC: 4.68 10*6/uL (ref 4.20–5.82)
RDW: 14.5 % (ref 11.0–14.6)
WBC: 4.5 10*3/uL (ref 4.0–10.3)

## 2016-02-20 LAB — FERRITIN: Ferritin: 87 ng/ml (ref 22–316)

## 2016-02-20 NOTE — Progress Notes (Signed)
[name] HEMATOLOGY OFFICE PROGRESS NOTE  DIAGNOSIS:  1.Hemochromatosis diagnosed in 2007 in [place]  2. 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 Last phlebotomy in February 2017.  PROBLEM LIST 1. Hemochromatosis, diagnosed in 2007 in [place]. 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  2. Diabetes, diagnosed in 2007, managed by his endocrinologist at [facility]. 3. Hepatitis C and liver cirrhosis. This is managed by Dr. Gardiner [name] and Dr. Donnal [name]  at [facility]. He received and anti-hepatitis C treatment in 2015 and his virus title became undetectable afterwards. 4. History of heart failure.  INTERIM HISTORY: He return for follow up. He went on a 70-days long trip over the country in June with his wife, and developed fatigue, low appetite, and not feeling well was an of the trip. He flew back in mid August, and has not been feeling well since then. He developed productive cough in the past 2-3 weeks, with facial congestion, purulent nasal discharge, his sputum is clear and white. No fever or chills. He probably has lost some weight lately (10 pounds in the past 9 months).   MEDICAL HISTORY: Past Medical History:  Diagnosis Date  . Anxiety   . Diabetes (HCC)   . Foot pain left   to have MRI tues.   . Hepatitis C   . High cholesterol   Hemochromatosis (Iron Overload)  ALLERGIES:  is allergic to sulfa antibiotics; sulfamethoxazole; and testosterone.  MEDICATIONS:    Medication List       Accurate as of 02/20/16 10:54 AM. Always use your most recent med list.          B-D ULTRAFINE III SHORT PEN 31G X 8 MM Misc Generic drug:  Insulin Pen Needle Injects 4 times daily   gabapentin 100 MG capsule Commonly known as:  NEURONTIN Take 1 capsule  (100 mg total) by mouth 2 (two) times daily.   gabapentin 300 MG capsule Commonly known as:  NEURONTIN TAKE ONE CAPSULE BY MOUTH AT BEDTIME   glucagon 1 MG injection Inject 1 mg into the vein once as needed.   insulin aspart 100 UNIT/ML injection Commonly known as:  novoLOG Inject 0-20 Units into the skin 3 (three) times daily before meals. Sliding scale   insulin glargine 100 UNIT/ML injection Commonly known as:  LANTUS Inject 50 Units into the skin daily.   lactulose 10 GM/15ML solution Commonly known as:  CHRONULAC Take 10 g by mouth daily as needed.   propranolol 40 MG tablet Commonly known as:  INDERAL Take 40 mg by mouth 2 (two) times daily.   Urea 39 % Crea Apply 1 application topically daily.       SURGICAL HISTORY:  Past Surgical History:  Procedure Laterality Date  . None      REVIEW OF SYSTEMS:   Constitutional: Denies fevers, chills or abnormal weight loss Eyes: Denies blurriness of vision Ears, nose, mouth, throat, and face: Denies mucositis or sore throat Respiratory: Denies cough, dyspnea or wheezes Cardiovascular: Denies palpitation, chest discomfort or lower extremity swelling Gastrointestinal:  Denies nausea, heartburn or change in bowel habits Skin: Denies abnormal skin rashes Lymphatics: Denies new lymphadenopathy or easy bruising Neurological:Denies numbness, tingling or new weaknesses Behavioral/Psych: Mood is stable, no new changes  All other systems were reviewed with the patient  and are negative.  PHYSICAL EXAMINATION: ECOG PERFORMANCE STATUS: 2  Blood pressure 138/64, pulse 79, temperature 98.3 F (36.8 C), temperature source Oral, resp. rate 18, height 6' (1.829 m), weight 158 lb 3.2 oz (71.8 kg), SpO2 98 %.  GENERAL:alert, no distress and comfortable; bearded thin male.  SKIN: skin color, texture, turgor are normal, no rashes or significant lesions EYES: normal, Conjunctiva are pink and non-injected, sclera clear OROPHARYNX:no  exudate, no erythema and lips, buccal mucosa, and tongue normal  NECK: supple, thyroid normal size, non-tender, without nodularity LYMPH:  no palpable lymphadenopathy in the cervical, axillary or supraclavicular LUNGS: clear to auscultation and percussion with normal breathing effort HEART: regular rate & rhythm and no murmurs and no lower extremity edema ABDOMEN:abdomen soft, non-tender and normal bowel sounds; enlarged spleen about 2 cm below costaphrenic angle. No additional stigmata of liver disease.  Musculoskeletal:no cyanosis of digits and no clubbing  NEURO: alert & oriented x 3 with fluent speech, no focal motor/sensory deficits  LABORATORY DATA: Labs:  CBC Latest Ref Rng & Units 02/20/2016 11/03/2015 08/02/2015  WBC 4.0 - 10.3 10e3/uL 4.5 7.6 6.6  Hemoglobin 13.0 - 17.1 g/dL 16.1 09.6 04.5  Hematocrit 38.4 - 49.9 % 42.9 38.3(L) 47.2  Platelets 140 - 400 10e3/uL 93(L) 101(L) 125(L)    CMP Latest Ref Rng & Units 05/13/2014 01/28/2014 10/07/2013  Glucose 70 - 140 mg/dl 409(W) 119(J) 478(G)  BUN 7.0 - 26.0 mg/dL 95.6 21.3 08.6  Creatinine 0.7 - 1.3 mg/dL 0.8 0.9 0.9  Sodium 578 - 145 mEq/L 138 136 133(L)  Potassium 3.5 - 5.1 mEq/L 4.0 3.9 4.2  Chloride 96 - 112 mEq/L - - -  CO2 22 - 29 mEq/L 29 28 23   Calcium 8.4 - 10.4 mg/dL 9.1 8.8 8.4  Total Protein 6.4 - 8.3 g/dL 7.5 6.7 7.0  Total Bilirubin 0.20 - 1.20 mg/dL 4.69 6.29 5.28  Alkaline Phos 40 - 150 U/L 142 130 170(H)  AST 5 - 34 U/L 52(H) 32 131(H)  ALT 0 - 55 U/L 41 19 88(H)   Results for John [name] (MRN [id number]) as of 02/20/2016 07:30  Ref. Range 07/14/2015 08:33 08/02/2015 09:51 11/03/2015 09:11  Iron Latest Ref Range: 42 - 163 ug/dL 272 (H) 536 (H) 56  UIBC Latest Ref Range: 117 - 376 ug/dL 644 034 742  TIBC Latest Ref Range: 202 - 409 ug/dL 595 638 756  %SAT Latest Ref Range: 20 - 55 % 62 (H) 46 16 (L)  Ferritin Latest Ref Range: 22 - 316 ng/ml 42 39 32      RADIOGRAPHIC STUDIES: MR BRAIN WO CONTRAST  09/21/2013  IMPRESSION: Mildly abnormal MRI brain (without) demonstrating: 1. Scattered periventricular, subcortical and juxtacortical foci of non-specific T2 hyperintensities. These findings are non-specific and considerations include autoimmune, inflammatory, post-infectious, microvascular ischemic or migraine associated etiologies. 2. Slightly hyperintense T1 signal in the bilateral globus pallidi. This is non-specific but can be seen in association with liver disease and mineral deposition. 3. No acute findings   ASSESSMENT: John [name] 60 y.o. male with a history of Hepatitis C and Hemochromatosis.   PLAN:  1. Hereditary hemochromatosis.  --We discuss the goal is maintaining his ferritin less than 50 and transferrin saturation <50%).  -He has been very compliant with lab and phlebotomy as needed every 2 months -His ferritin and iron study from 3-4 months ago was normal, today's results still pending. -We discussed low iron diet. He has cut back of red meat intake, I encourage  him to eat last red meat, and consider chicken, [place], seafood etc. as his main protein resource. -He has not required phlebotomy often, we'll change his lab monitoring every 3 months  2. Productive cough -He has been having productive cough for the past 3 weeks, no fever or chills, white count is normal -He also has symptoms suggesting sinusitis -I recommend him to have a chest x-ray today, to ruled out, infection -He will follow-up with his primary care physician  3. Cytopenia His mild anemia is likely secondary to phlebotomy and iron deficiency, resolved now  His thrombocytopenia is likely secondary to liver cirrhosis and splenmagely, very stable, no signs of bleeding. I'll watch closely.  4. DM2 secondary #1.  --Continue insulin sliding scale and follow-up with [facility].   5. 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  --He [facility] and finished treatment for HepC in 2015 --Continue nadolol. He was counseled to report any bleeding.  --His HCV level is non-detected (labs in Care Everywhere) with normalization of his transiminases.  -His [facility] by ultrasound and lab every 6 months by Dr. Donnal DebarBruggen  6 history of CHF -I discussed his echo results from October 2016, which showed normal EF and normal structure.    Follow-up.  -I'll call him tomorrow and let him know if he needs phlebotomy -Lab every 3 months, he will use my chart to check his results, and call us if he needs phlebotomy, he knows the criteria for phlebotomy -CXR today, I'll call him later with the results  All questions were answered. The patient knows to call the clinic with any problems, questions or concerns. We can certainly see the patient much sooner if necessary.  I spent 20 minutes counseling the patient face to face. The total time spent in the appointment was 30 minutes.  [name], Minas Bonser 02/20/2016

## 2016-02-20 NOTE — Telephone Encounter (Signed)
Avs report and schd given per 02/20/16 los °

## 2016-02-21 ENCOUNTER — Telehealth: Payer: Self-pay | Admitting: *Deleted

## 2016-02-21 NOTE — Telephone Encounter (Signed)
Called pt per Dr Latanya MaudlinFeng's request & informed that CXR OK & no ATB at this time but if cough cont to see PCP .  He reports seeing PCP this am & was placed on augmentin.  Informed also that iron levels up & needs phlebotomy next week per Dr Mosetta PuttFeng.  Will send message to scheduler.  Pt expressed understanding.

## 2016-02-23 ENCOUNTER — Ambulatory Visit (INDEPENDENT_AMBULATORY_CARE_PROVIDER_SITE_OTHER): Payer: Medicaid Other | Admitting: Podiatry

## 2016-02-23 VITALS — Ht 72.0 in | Wt 158.0 lb

## 2016-02-23 DIAGNOSIS — B351 Tinea unguium: Secondary | ICD-10-CM | POA: Diagnosis not present

## 2016-02-23 DIAGNOSIS — M2042 Other hammer toe(s) (acquired), left foot: Secondary | ICD-10-CM

## 2016-02-23 DIAGNOSIS — M79676 Pain in unspecified toe(s): Secondary | ICD-10-CM

## 2016-02-23 DIAGNOSIS — E1140 Type 2 diabetes mellitus with diabetic neuropathy, unspecified: Secondary | ICD-10-CM

## 2016-02-23 NOTE — Progress Notes (Signed)
Subjective:     Patient ID: John [name], male   DOB: Jul 17, 1955, 60 y.o.   MRN: [id number]  HPI this patient presents to the office with chief complaint of pain noted under the fourth toe of the left foot. He says there is no pain or discomfort today. There is occasional pain when he walks and wears his shoes under the fourth toe.  Patient also says his nails have elongates since his last visit.  He presents for preventive foot care services.   Review of Systems     Objective:   Physical Exam GENERAL APPEARANCE: Alert, conversant. Appropriately groomed. No acute distress.  VASCULAR: Pedal pulses are  palpable at  [facility] and PT bilateral.  Capillary refill time is immediate to all digits,  Normal temperature gradient.    NEUROLOGIC: sensation is normal to 5.07 monofilament at 5/5 sites bilateral.  Light touch is intact bilateral, Muscle strength normal.  MUSCULOSKELETAL: acceptable muscle strength, tone and stability bilateral.  Intrinsic muscluature intact bilateral.  Rectus appearance of foot and digits noted bilateral. Exostosis midfoot B/L. Hammer toe fourth toe left foot.  DERMATOLOGIC: skin color, texture, and turgor are within normal limits.  No preulcerative lesions or ulcers  are seen, no interdigital maceration noted.  No open lesions present.   No drainage noted. Plantar tyloma sub 5th metatarsal left foot. Callus under fourth toe left foot.  NAILS  Thick disfigured discolored nails both feet.      Assessment:     Hammer toe fourth toe left foot. Diabetes with no complications.  Onychomycosis  Porokeratosis  B/l    Plan:    Debride porokeratosis  Debride nails.  Debride callus hammer toe left foot.   John [name] [name]

## 2016-02-28 ENCOUNTER — Ambulatory Visit (HOSPITAL_BASED_OUTPATIENT_CLINIC_OR_DEPARTMENT_OTHER): Payer: Medicaid Other

## 2016-02-28 ENCOUNTER — Telehealth: Payer: Self-pay | Admitting: *Deleted

## 2016-02-28 NOTE — Telephone Encounter (Signed)
Patient is here for a PHL. He is asking if he should return sooner than 3 mos. For lab, Dr. Mosetta PuttFeng and PHL since his ferritin went from 32 to 87.  Will check with Dr. Mosetta PuttFeng and change appts. If needed.

## 2016-02-28 NOTE — Telephone Encounter (Signed)
Please le pt know that he has been requiring phlebotomy every 3 months on average, is why we decided to do lab and phlebotomy every 3 months on his last visit. If he is more comfortable with more frequent lab work, we can change to every 6-8 weeks then. OK to reschedule if he wishes.   [name], Shermon Bozzi  02/28/2016

## 2016-02-28 NOTE — Patient Instructions (Signed)

## 2016-02-29 ENCOUNTER — Telehealth: Payer: Self-pay | Admitting: *Deleted

## 2016-02-29 NOTE — Telephone Encounter (Signed)
Called pt to discuss frequency of lab/phlebotomy & his concern is that plan was discussed before Dr Feng had results of labsMosetta Putt & he is concerned that his iron saturation was 100 % & iron was 349 & ferritin was 87 & he states that Dr Mosetta PuttFeng wants him down to 50 % on both satuation & ferritin. He doesn't think one phlebotomy will bring it down that much so therefore wants to know if he should have lab test much sooner that in 2-3 months.  Message to Dr Mosetta PuttFeng.

## 2016-03-01 NOTE — Telephone Encounter (Signed)
Per Dr. Mosetta PuttFeng.  OK to repeat labs every 6 weeks then.  Did scheduling request to do labs/PHL q6wks.  Called patient and left message to call us ZO:[id number]:scheduling.  Just wanted to let him know to expect a call from the schedulers to repeat labs in 6 weeks.

## 2016-03-02 ENCOUNTER — Telehealth: Payer: Self-pay | Admitting: Hematology

## 2016-03-02 NOTE — Telephone Encounter (Signed)
sw pt to confirm 10/31 appt per LOS

## 2016-03-27 ENCOUNTER — Telehealth: Payer: Self-pay | Admitting: Hematology

## 2016-03-27 NOTE — Telephone Encounter (Signed)
RETURNED CALL TO PATIENT WIFE RE MOVING LABS 1-3 DAYS PRIOR TO PHLEB APPOINTMENTS.   SPOKE WITH WIFE AND MOVED LABS FROM 10/30  TO 10/27, 12/11 TO 12/8 AND 1/22 TO 1/19 ALL @ 8:30 AM PER WIFE REQUEST. WIFE HAS NEW APPOINTMENT DATES/TIMES.

## 2016-04-06 ENCOUNTER — Other Ambulatory Visit (HOSPITAL_BASED_OUTPATIENT_CLINIC_OR_DEPARTMENT_OTHER): Payer: Medicaid Other

## 2016-04-06 LAB — CBC WITH DIFFERENTIAL/PLATELET
BASO%: 0.3 % (ref 0.0–2.0)
BASOS ABS: 0 10*3/uL (ref 0.0–0.1)
EOS ABS: 0.2 10*3/uL (ref 0.0–0.5)
EOS%: 5.7 % (ref 0.0–7.0)
HCT: 40.6 % (ref 38.4–49.9)
HEMOGLOBIN: 13.5 g/dL (ref 13.0–17.1)
LYMPH%: 29 % (ref 14.0–49.0)
MCH: 31.8 pg (ref 27.2–33.4)
MCHC: 33.3 g/dL (ref 32.0–36.0)
MCV: 95.5 fL (ref 79.3–98.0)
MONO#: 0.4 10*3/uL (ref 0.1–0.9)
MONO%: 9.2 % (ref 0.0–14.0)
NEUT#: 2.1 10*3/uL (ref 1.5–6.5)
NEUT%: 55.8 % (ref 39.0–75.0)
Platelets: 81 10*3/uL — ABNORMAL LOW (ref 140–400)
RBC: 4.25 10*6/uL (ref 4.20–5.82)
RDW: 13.5 % (ref 11.0–14.6)
WBC: 3.8 10*3/uL — ABNORMAL LOW (ref 4.0–10.3)
lymph#: 1.1 10*3/uL (ref 0.9–3.3)

## 2016-04-06 LAB — IRON AND TIBC
%SAT: 40 % (ref 20–55)
IRON: 145 ug/dL (ref 42–163)
TIBC: 361 ug/dL (ref 202–409)
UIBC: 216 ug/dL (ref 117–376)

## 2016-04-06 LAB — FERRITIN: Ferritin: 49 ng/ml (ref 22–316)

## 2016-04-10 ENCOUNTER — Other Ambulatory Visit: Payer: Medicaid Other

## 2016-04-10 ENCOUNTER — Ambulatory Visit: Payer: Medicaid Other

## 2016-04-10 NOTE — Progress Notes (Signed)
Labs from 04-06-16 printed and given to [name]. Phlebotomy not required today. According to MD note maintaining Ferritin less then 50, was 49 on 04-06-16 and maintaining % sat less than 50 and he was 40 on 04-06-16.

## 2016-04-22 ENCOUNTER — Other Ambulatory Visit: Payer: Self-pay | Admitting: Adult Health

## 2016-05-18 ENCOUNTER — Other Ambulatory Visit: Payer: Medicaid Other

## 2016-05-22 ENCOUNTER — Other Ambulatory Visit: Payer: Self-pay | Admitting: *Deleted

## 2016-05-22 ENCOUNTER — Other Ambulatory Visit (HOSPITAL_BASED_OUTPATIENT_CLINIC_OR_DEPARTMENT_OTHER): Payer: Medicaid Other

## 2016-05-22 ENCOUNTER — Other Ambulatory Visit: Payer: Medicaid Other

## 2016-05-22 ENCOUNTER — Other Ambulatory Visit: Payer: Self-pay | Admitting: Hematology

## 2016-05-22 ENCOUNTER — Other Ambulatory Visit (HOSPITAL_BASED_OUTPATIENT_CLINIC_OR_DEPARTMENT_OTHER): Payer: Medicaid Other | Admitting: *Deleted

## 2016-05-22 ENCOUNTER — Ambulatory Visit (HOSPITAL_BASED_OUTPATIENT_CLINIC_OR_DEPARTMENT_OTHER): Payer: Medicaid Other

## 2016-05-22 LAB — FERRITIN: FERRITIN: 47 ng/mL (ref 22–316)

## 2016-05-22 LAB — CBC WITH DIFFERENTIAL/PLATELET
BASO%: 0.3 % (ref 0.0–2.0)
Basophils Absolute: 0 10*3/uL (ref 0.0–0.1)
EOS%: 5.3 % (ref 0.0–7.0)
Eosinophils Absolute: 0.3 10*3/uL (ref 0.0–0.5)
HEMATOCRIT: 44.4 % (ref 38.4–49.9)
HEMOGLOBIN: 15.5 g/dL (ref 13.0–17.1)
LYMPH#: 1.7 10*3/uL (ref 0.9–3.3)
LYMPH%: 27.7 % (ref 14.0–49.0)
MCH: 32.2 pg (ref 27.2–33.4)
MCHC: 34.9 g/dL (ref 32.0–36.0)
MCV: 92.1 fL (ref 79.3–98.0)
MONO#: 0.5 10*3/uL (ref 0.1–0.9)
MONO%: 9 % (ref 0.0–14.0)
NEUT%: 57.7 % (ref 39.0–75.0)
NEUTROS ABS: 3.5 10*3/uL (ref 1.5–6.5)
Platelets: 110 10*3/uL — ABNORMAL LOW (ref 140–400)
RBC: 4.82 10*6/uL (ref 4.20–5.82)
RDW: 13.5 % (ref 11.0–14.6)
WBC: 6 10*3/uL (ref 4.0–10.3)

## 2016-05-22 LAB — IRON AND TIBC
%SAT: 62 % — ABNORMAL HIGH (ref 20–55)
Iron: 246 ug/dL — ABNORMAL HIGH (ref 42–163)
TIBC: 399 ug/dL (ref 202–409)
UIBC: 153 ug/dL (ref 117–376)

## 2016-05-22 NOTE — Progress Notes (Signed)
Holding phlebotomy today per Dr. Mosetta PuttFeng. Awaiting Ferritin results. Patient verbalized understanding. Patient also reported that he felt "fine".   Blane OharaMelia Burris, BSN, RN 05/22/2016 9:20 AM

## 2016-05-24 ENCOUNTER — Telehealth: Payer: Self-pay | Admitting: *Deleted

## 2016-05-24 NOTE — Telephone Encounter (Signed)
Please let pt know his iron study result, feritin was good, bu this serum iron level and saturation were high, please schedule phlebotomy within a week, thanks  Spoke with pt and informed pt of Dr. Latanya MaudlinFeng's instructions as above.  Pt understood that a scheduler will contact pt with appt date and time for phlebotomy next week.  Schedule message sent.

## 2016-05-29 ENCOUNTER — Ambulatory Visit (HOSPITAL_BASED_OUTPATIENT_CLINIC_OR_DEPARTMENT_OTHER): Payer: Medicaid Other

## 2016-05-29 MED ORDER — HYDROMORPHONE HCL 4 MG/ML IJ SOLN
INTRAMUSCULAR | Status: AC
  Filled 2016-05-29: qty 1

## 2016-05-29 MED ORDER — DEXAMETHASONE SODIUM PHOSPHATE 10 MG/ML IJ SOLN
INTRAMUSCULAR | Status: AC
  Filled 2016-05-29: qty 1

## 2016-05-29 NOTE — Patient Instructions (Signed)

## 2016-06-29 ENCOUNTER — Other Ambulatory Visit: Payer: Medicaid Other

## 2016-07-02 ENCOUNTER — Ambulatory Visit (HOSPITAL_BASED_OUTPATIENT_CLINIC_OR_DEPARTMENT_OTHER): Payer: Medicaid Other

## 2016-07-02 LAB — CBC WITH DIFFERENTIAL/PLATELET
BASO%: 0.4 % (ref 0.0–2.0)
Basophils Absolute: 0 10*3/uL (ref 0.0–0.1)
EOS%: 7.5 % — AB (ref 0.0–7.0)
Eosinophils Absolute: 0.5 10*3/uL (ref 0.0–0.5)
HCT: 44 % (ref 38.4–49.9)
HEMOGLOBIN: 14.8 g/dL (ref 13.0–17.1)
LYMPH%: 34.4 % (ref 14.0–49.0)
MCH: 32.1 pg (ref 27.2–33.4)
MCHC: 33.6 g/dL (ref 32.0–36.0)
MCV: 95.7 fL (ref 79.3–98.0)
MONO#: 0.7 10*3/uL (ref 0.1–0.9)
MONO%: 10.8 % (ref 0.0–14.0)
NEUT#: 2.9 10*3/uL (ref 1.5–6.5)
NEUT%: 46.9 % (ref 39.0–75.0)
Platelets: 119 10*3/uL — ABNORMAL LOW (ref 140–400)
RBC: 4.6 10*6/uL (ref 4.20–5.82)
RDW: 14 % (ref 11.0–14.6)
WBC: 6.2 10*3/uL (ref 4.0–10.3)
lymph#: 2.1 10*3/uL (ref 0.9–3.3)

## 2016-07-02 LAB — IRON AND TIBC
%SAT: 100 % — AB (ref 20–55)
IRON: 357 ug/dL — AB (ref 42–163)
TIBC: 358 ug/dL (ref 202–409)
UIBC: <1 ug/dL (ref 117–376)

## 2016-07-02 LAB — FERRITIN: FERRITIN: 89 ng/mL (ref 22–316)

## 2016-07-03 ENCOUNTER — Ambulatory Visit (HOSPITAL_BASED_OUTPATIENT_CLINIC_OR_DEPARTMENT_OTHER): Payer: Medicaid Other

## 2016-07-03 ENCOUNTER — Other Ambulatory Visit: Payer: Medicaid Other

## 2016-07-03 ENCOUNTER — Telehealth: Payer: Self-pay | Admitting: *Deleted

## 2016-07-03 NOTE — Progress Notes (Signed)
Pt received phlebotomy today. 17G placed in L AC at 0929 and 525g removed by 0936. Pt offered fluids and nutrition. Given something to drink. VSS after observation period at 1006.

## 2016-07-03 NOTE — Patient Instructions (Signed)
Therapeutic Phlebotomy Therapeutic phlebotomy is the controlled removal of blood from a person's body for the purpose of treating a medical condition. The procedure is similar to donating blood. Usually, about a pint (470 mL, or 0.47L) of blood is removed. The average adult has 9-12 pints (4.3-5.7 L) of blood. Therapeutic phlebotomy may be used to treat the following medical conditions:  Hemochromatosis. This is a condition in which the blood contains too much iron.  Polycythemia vera. This is a condition in which the blood contains too many red blood cells.  Porphyria cutanea tarda. This is a disease in which an important part of hemoglobin is not made properly. It results in the buildup of abnormal amounts of porphyrins in the body.  Sickle cell disease. This is a condition in which the red blood cells form an abnormal crescent shape rather than a round shape. Tell a health care provider about:  Any allergies you have.  All medicines you are taking, including vitamins, herbs, eye drops, creams, and over-the-counter medicines.  Any problems you or family members have had with anesthetic medicines.  Any blood disorders you have.  Any surgeries you have had.  Any medical conditions you have. What are the risks? Generally, this is a safe procedure. However, problems may occur, including:  Nausea or light-headedness.  Low blood pressure.  Soreness, bleeding, swelling, or bruising at the needle insertion site.  Infection. What happens before the procedure?  Follow instructions from your health care provider about eating or drinking restrictions.  Ask your health care provider about changing or stopping your regular medicines. This is especially important if you are taking diabetes medicines or blood thinners.  Wear clothing with sleeves that can be raised above the elbow.  Plan to have someone take you home after the procedure.  You may have a blood sample taken. What happens  during the procedure?  A needle will be inserted into one of your veins.  Tubing and a collection bag will be attached to that needle.  Blood will flow through the needle and tubing into the collection bag.  You may be asked to open and close your hand slowly and continually during the entire collection.  After the specified amount of blood has been removed from your body, the collection bag and tubing will be clamped.  The needle will be removed from your vein.  Pressure will be held on the site of the needle insertion to stop the bleeding.  A bandage (dressing) will be placed over the needle insertion site. The procedure may vary among health care providers and hospitals. What happens after the procedure?  Your recovery will be assessed and monitored.  You can return to your normal activities as directed by your health care provider. This information is not intended to replace advice given to you by your health care provider. Make sure you discuss any questions you have with your health care provider. Document Released: 10/30/2010 Document Revised: 01/28/2016 Document Reviewed: 05/24/2014 Elsevier Interactive Patient Education  2017 Elsevier Inc.  

## 2016-07-03 NOTE — Telephone Encounter (Signed)
"  I had lab yesterday.  What was the ferritin level and saturation levels so I'll know if I need to come in at 8:45 am."  Ferritin = 89, Saturation = 100 %.  "I'm on my way in."

## 2016-07-16 ENCOUNTER — Other Ambulatory Visit: Payer: Self-pay | Admitting: Neurology

## 2016-08-13 ENCOUNTER — Ambulatory Visit: Payer: Medicaid Other | Admitting: Hematology

## 2016-08-13 ENCOUNTER — Other Ambulatory Visit (HOSPITAL_BASED_OUTPATIENT_CLINIC_OR_DEPARTMENT_OTHER): Payer: Medicaid Other

## 2016-08-13 LAB — CBC WITH DIFFERENTIAL/PLATELET
BASO%: 0.4 % (ref 0.0–2.0)
Basophils Absolute: 0 10*3/uL (ref 0.0–0.1)
EOS%: 5.9 % (ref 0.0–7.0)
Eosinophils Absolute: 0.3 10*3/uL (ref 0.0–0.5)
HCT: 42.1 % (ref 38.4–49.9)
HGB: 14.3 g/dL (ref 13.0–17.1)
LYMPH%: 30.8 % (ref 14.0–49.0)
MCH: 31.4 pg (ref 27.2–33.4)
MCHC: 34 g/dL (ref 32.0–36.0)
MCV: 92.3 fL (ref 79.3–98.0)
MONO#: 0.5 10*3/uL (ref 0.1–0.9)
MONO%: 9.1 % (ref 0.0–14.0)
NEUT%: 53.8 % (ref 39.0–75.0)
NEUTROS ABS: 2.7 10*3/uL (ref 1.5–6.5)
Platelets: 110 10*3/uL — ABNORMAL LOW (ref 140–400)
RBC: 4.56 10*6/uL (ref 4.20–5.82)
RDW: 13.4 % (ref 11.0–14.6)
WBC: 5.1 10*3/uL (ref 4.0–10.3)
lymph#: 1.6 10*3/uL (ref 0.9–3.3)

## 2016-08-13 LAB — IRON AND TIBC
%SAT: 16 % — ABNORMAL LOW (ref 20–55)
Iron: 70 ug/dL (ref 42–163)
TIBC: 434 ug/dL — AB (ref 202–409)
UIBC: 364 ug/dL (ref 117–376)

## 2016-08-13 LAB — FERRITIN: FERRITIN: 20 ng/mL — AB (ref 22–316)

## 2016-08-20 ENCOUNTER — Telehealth: Payer: Self-pay | Admitting: *Deleted

## 2016-08-20 ENCOUNTER — Ambulatory Visit: Payer: Medicaid Other | Admitting: Hematology

## 2016-08-20 NOTE — Telephone Encounter (Signed)
Patient left message that he would be unable to keep appt today due to weather and school cancellation.  Would like to reschedule.  Message sent to schedulers.

## 2016-08-21 ENCOUNTER — Telehealth: Payer: Self-pay | Admitting: Hematology

## 2016-08-21 NOTE — Telephone Encounter (Signed)
Confirm r/s appt with pt 5/14 at 10 am per LOS

## 2016-10-18 NOTE — Progress Notes (Signed)
[facility] HEMATOLOGY OFFICE PROGRESS NOTE  DIAGNOSIS:  1.Hemochromatosis diagnosed in 2007 in [place]  2. 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 Last phlebotomy in February 2017.  PROBLEM LIST 1. Hemochromatosis, diagnosed in 2007 in [place]. 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  2. Diabetes, diagnosed in 2007, managed by his endocrinologist at [facility]. 3. Hepatitis C and liver cirrhosis. This is managed by Dr. Gardiner Coins and Dr. Donnal Debar  at [facility]. He received and anti-hepatitis C treatment in 2015 and his virus title became undetectable afterwards. 4. History of heart failure.  INTERIM HISTORY: He return for follow up. He presents to the clinic today with Coughing, clear minimal phelgm and body aches, no fever which has been going on for a week. He left his phone at home but he will call to get a appointment later. He has a earache and sore throat. He was taking a medication around the same time he was getting sick before. He reports it is painful to talk to him.     MEDICAL HISTORY: Past Medical History:  Diagnosis Date  . Anxiety   . Diabetes (HCC)   . Foot pain left   to have MRI tues.   . Hepatitis C   . High cholesterol   Hemochromatosis (Iron Overload)  ALLERGIES:  is allergic to sulfa antibiotics; sulfamethoxazole; and testosterone.  MEDICATIONS:  Allergies as of 10/22/2016      Reactions   Sulfa Antibiotics Swelling   Of tongue   Sulfamethoxazole Swelling   Testosterone Rash, Dermatitis      Medication List       Accurate as of 10/22/16 11:23 PM. Always use your most recent med list.          amoxicillin-clavulanate 875-125 MG tablet Commonly known as:  AUGMENTIN Take 1 tablet by mouth 2 (two) times daily.    B-D ULTRAFINE III SHORT PEN 31G X 8 MM Misc Generic drug:  Insulin Pen Needle Injects 4 times daily   gabapentin 100 MG capsule Commonly known as:  NEURONTIN Take 1 capsule (100 mg total) by mouth 2 (two) times daily.   gabapentin 300 MG capsule Commonly known as:  NEURONTIN TAKE ONE CAPSULE BY MOUTH AT BEDTIME   glucagon 1 MG injection Inject 1 mg into the vein once as needed.   insulin aspart 100 UNIT/ML injection Commonly known as:  novoLOG Inject 0-20 Units into the skin 3 (three) times daily before meals. Sliding scale   insulin glargine 100 UNIT/ML injection Commonly known as:  LANTUS Inject 45 Units into the skin daily.   lactulose 10 GM/15ML solution Commonly known as:  CHRONULAC Take 10 g by mouth daily as needed.   propranolol 40 MG tablet Commonly known as:  INDERAL Take 40 mg by mouth 2 (two) times daily.   Urea 39 % Crea Apply 1 application topically daily.       SURGICAL HISTORY:  Past Surgical History:  Procedure Laterality Date  . None      REVIEW OF SYSTEMS:   Constitutional: Denies fevers, chills or abnormal weight loss Eyes: Denies blurriness of vision Ears, nose, mouth, throat, and face: Denies mucositis (+) earache and sore throat, pain while talking Respiratory:denies wheezes (+) persistent cough with clear phlegm Cardiovascular: Denies palpitation, chest discomfort or  lower extremity swelling Gastrointestinal:  Denies nausea, heartburn or change in bowel habits Skin: Denies abnormal skin rashes Lymphatics: Denies new lymphadenopathy or easy bruising Neurological:Denies numbness, tingling or new weaknesses Behavioral/Psych: Mood is stable, no new changes  All other systems were reviewed with the patient and are negative.  PHYSICAL EXAMINATION: ECOG PERFORMANCE STATUS: 2  Blood pressure 116/62, pulse 75, temperature 98.6 F (37 C), temperature source Oral, resp. rate 18, height 6' (1.829 m), weight 173 lb 1.6 oz (78.5 kg), SpO2 97  %.  GENERAL:alert, no distress and comfortable; bearded thin male.  SKIN: skin color, texture, turgor are normal, no rashes or significant lesions EYES: normal, Conjunctiva are pink and non-injected, sclera clear OROPHARYNX:no exudate, and lips, buccal mucosa, and tongue normal (+) erythema on throat NECK: supple, thyroid normal size, non-tender, without nodularity LYMPH:  no palpable lymphadenopathy in the cervical, axillary or supraclavicular LUNGS: clear to auscultation and percussion with normal breathing effort HEART: regular rate & rhythm and no murmurs and no lower extremity edema ABDOMEN:abdomen soft, non-tender and normal bowel sounds; enlarged spleen about 2 cm below costaphrenic angle. No additional stigmata of liver disease.  Musculoskeletal:no cyanosis of digits and no clubbing  NEURO: alert & oriented x 3 with fluent speech, no focal motor/sensory deficits  LABORATORY DATA: Labs:  CBC Latest Ref Rng & Units 10/22/2016 08/13/2016 07/02/2016  WBC 4.0 - 10.3 10e3/uL 4.6 5.1 6.2  Hemoglobin 13.0 - 17.1 g/dL 16.1 09.6 04.5  Hematocrit 38.4 - 49.9 % 44.4 42.1 44.0  Platelets 140 - 400 10e3/uL 99(L) 110(L) 119(L)    CMP Latest Ref Rng & Units 05/13/2014 01/28/2014 10/07/2013  Glucose 70 - 140 mg/dl 409(W) 119(J) 478(G)  BUN 7.0 - 26.0 mg/dL 95.6 21.3 08.6  Creatinine 0.7 - 1.3 mg/dL 0.8 0.9 0.9  Sodium 578 - 145 mEq/L 138 136 133(L)  Potassium 3.5 - 5.1 mEq/L 4.0 3.9 4.2  Chloride 96 - 112 mEq/L - - -  CO2 22 - 29 mEq/L 29 28 23   Calcium 8.4 - 10.4 mg/dL 9.1 8.8 8.4  Total Protein 6.4 - 8.3 g/dL 7.5 6.7 7.0  Total Bilirubin 0.20 - 1.20 mg/dL 4.69 6.29 5.28  Alkaline Phos 40 - 150 U/L 142 130 170(H)  AST 5 - 34 U/L 52(H) 32 131(H)  ALT 0 - 55 U/L 41 19 88(H)   Results for DORIN, [place] (MRN [id number]) as of 10/22/2016 23:24  Ref. Range 07/02/2016 13:15 08/13/2016 09:19 10/22/2016 10:24  Iron Latest Ref Range: 42 - 163 ug/dL 272 (H) 70 536  UIBC Latest Ref Range: 117 - 376 ug/dL  <6.4 403 474  TIBC Latest Ref Range: 202 - 409 ug/dL 259 563 (H) 875  %SAT Latest Ref Range: 20 - 55 % 100 (H) 16 (L) 43  Ferritin Latest Ref Range: 22 - 316 ng/ml 89 20 (L) 53     RADIOGRAPHIC STUDIES:  MR BRAIN WO CONTRAST 09/21/2013  IMPRESSION: Mildly abnormal MRI brain (without) demonstrating: 1. Scattered periventricular, subcortical and juxtacortical foci of non-specific T2 hyperintensities. These findings are non-specific and considerations include autoimmune, inflammatory, post-infectious, microvascular ischemic or migraine associated etiologies. 2. Slightly hyperintense T1 signal in the bilateral globus pallidi. This is non-specific but can be seen in association with liver disease and mineral deposition. 3. No acute findings   ASSESSMENT: John [name] 61 y.o. male with a history of Hepatitis C and Hemochromatosis.   PLAN:  1. Hereditary hemochromatosis.  --We discuss the goal is maintaining his ferritin less than 50  and transferrin saturation <50%).  -He has been very compliant with lab and phlebotomy as needed every 2 months -His ferritin and iron study previously was normal -We previously discusseded low iron diet. He has cut back of red meat intake, I encourage him to eat last red meat, and consider chicken, [place], seafood etc. as his main protein resource. -He has not required phlebotomy often, we'll change his lab monitoring every 3 months -Her serum iron level and ferritin are slightly elevated, we'll need phlebotomy. Due to current sickness and iron levels pending he will f/u with a Phlebotomy in a couple of weeks  2. URI  -He has been having productive cough for the past one week, no fever or chills, white count is normal -He has worsened with clear phlegm, sore throat and pain with talking -I give her a prescription of Augmentin. If no improvement, he will follow-up with his PCP.  3. Cytopenia His mild anemia is likely secondary to phlebotomy and iron deficiency,  resolved now  His thrombocytopenia is likely secondary to liver cirrhosis and splenmagely, very stable, no signs of bleeding. I'll watch closely.  4. DM2 secondary #1.  --Continue insulin sliding scale and follow-up with [facility].   5. 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  --He follows at with [facility] and finished treatment for HepC in 2015 --Continue nadolol. He was counseled to report any bleeding.  --His HCV level is non-detected (labs in Care Everywhere) with normalization of his transiminases.  -His [facility] by ultrasound and lab every 6 months by Dr. Donnal DebarBruggen -He will need to reschedule with Dr. Donnal DebarBruggen to do scan and AFP  6 history of CHF -I previously discussed his echo results from October 2016, which showed normal EF and normal structure.    Follow-up.  -Lab every 3 months, he will use my chart to check his results, and call us if he needs phlebotomy, he knows the criteria for phlebotomy -Phlebotomy in 2 weeks -I'll see him back in 9 months. -I give him a prescription of Augmentin for URI today    All questions were answered. The patient knows to call the clinic with any problems, questions or concerns. We can certainly see the patient much sooner if necessary.  I spent 20 minutes counseling the patient face to face. The total time spent in the appointment was 30 minutes.  This document serves as a record of services personally performed by Malachy MoodYan Harper Smoker, MD. It was created on her behalf by Delphina CahillAmoya Bennett, a trained medical scribe. The creation of this record is based on the scribe's personal observations and the provider's statements to them. This document has been checked and approved by the attending provider.  [name], Cailey Trigueros 10/22/2016

## 2016-10-22 ENCOUNTER — Ambulatory Visit (HOSPITAL_BASED_OUTPATIENT_CLINIC_OR_DEPARTMENT_OTHER): Payer: Medicaid Other | Admitting: Hematology

## 2016-10-22 ENCOUNTER — Encounter: Payer: Self-pay | Admitting: Hematology

## 2016-10-22 ENCOUNTER — Other Ambulatory Visit (HOSPITAL_BASED_OUTPATIENT_CLINIC_OR_DEPARTMENT_OTHER): Payer: Medicaid Other

## 2016-10-22 DIAGNOSIS — K7469 Other cirrhosis of liver: Secondary | ICD-10-CM

## 2016-10-22 DIAGNOSIS — D6959 Other secondary thrombocytopenia: Secondary | ICD-10-CM

## 2016-10-22 DIAGNOSIS — B182 Chronic viral hepatitis C: Secondary | ICD-10-CM

## 2016-10-22 DIAGNOSIS — E119 Type 2 diabetes mellitus without complications: Secondary | ICD-10-CM | POA: Diagnosis not present

## 2016-10-22 DIAGNOSIS — D696 Thrombocytopenia, unspecified: Secondary | ICD-10-CM

## 2016-10-22 LAB — CBC WITH DIFFERENTIAL/PLATELET
BASO%: 0.6 % (ref 0.0–2.0)
BASOS ABS: 0 10*3/uL (ref 0.0–0.1)
EOS%: 6.5 % (ref 0.0–7.0)
Eosinophils Absolute: 0.3 10*3/uL (ref 0.0–0.5)
HEMATOCRIT: 44.4 % (ref 38.4–49.9)
HEMOGLOBIN: 14.9 g/dL (ref 13.0–17.1)
LYMPH#: 1.2 10*3/uL (ref 0.9–3.3)
LYMPH%: 25 % (ref 14.0–49.0)
MCH: 31 pg (ref 27.2–33.4)
MCHC: 33.6 g/dL (ref 32.0–36.0)
MCV: 92.2 fL (ref 79.3–98.0)
MONO#: 0.5 10*3/uL (ref 0.1–0.9)
MONO%: 9.8 % (ref 0.0–14.0)
NEUT%: 58.1 % (ref 39.0–75.0)
NEUTROS ABS: 2.7 10*3/uL (ref 1.5–6.5)
Platelets: 99 10*3/uL — ABNORMAL LOW (ref 140–400)
RBC: 4.82 10*6/uL (ref 4.20–5.82)
RDW: 14.6 % (ref 11.0–14.6)
WBC: 4.6 10*3/uL (ref 4.0–10.3)

## 2016-10-22 LAB — IRON AND TIBC
%SAT: 43 % (ref 20–55)
Iron: 161 ug/dL (ref 42–163)
TIBC: 373 ug/dL (ref 202–409)
UIBC: 211 ug/dL (ref 117–376)

## 2016-10-22 LAB — FERRITIN: FERRITIN: 53 ng/mL (ref 22–316)

## 2016-10-22 MED ORDER — AMOXICILLIN-POT CLAVULANATE 875-125 MG PO TABS: 1.0000 | ORAL_TABLET | Freq: Two times a day (BID) | ORAL | 0 refills | Status: DC

## 2016-10-25 ENCOUNTER — Telehealth: Payer: Self-pay | Admitting: Hematology

## 2016-10-25 NOTE — Telephone Encounter (Signed)
Patient bypassed scheduling area.  °Appointments scheduled per 10/22/16 los. Patient was mailed an appointment letter and schedule, per 10/22/16 los. °

## 2016-11-06 ENCOUNTER — Ambulatory Visit (HOSPITAL_BASED_OUTPATIENT_CLINIC_OR_DEPARTMENT_OTHER): Payer: Medicaid Other

## 2016-11-06 MED ORDER — SODIUM CHLORIDE 0.9 % IV SOLN
INTRAVENOUS | Status: DC
  Administered 2016-11-06: 16:00:00 via INTRAVENOUS

## 2016-11-06 NOTE — Progress Notes (Signed)
Pt experienced vagal response from phlebotomy procedure today. Initiated at 1549, pt commented that he was "feeling bad" at 1610, but did not want to discontinue treatment. At 1612 pt stated, "I have to stop. I feel like I am going to pass out." Pt became pale, diaphoretic, and nauseous. Phlebotomy was discontinued at 1613 and IVF started. Pt was immediately laid down supine. Wet wash cloth and sprite was provided. VS taken and first BP post phlebotomy 60/38. Clenton PareKristin Curcio, NP was notified and arrived chairside at 1620. Subsequent VS taken, and when Belenda CruiseKristin, NP left the pt she stated, "You already look a lot better. You are perking up." Belenda CruiseKristin, NP relayed to me that the pt should be kept for another fifteen minutes (until 1645) and the SBP must be at least 90. Fluid rate was slowed, and the pt was given additional sprite and slowly raised to the seated position. Pt discharged at 1650 with VSS and asymptomatic.

## 2016-11-06 NOTE — Progress Notes (Signed)
As us to see the patient in the infusion room. The patient was near completion of his phlebotomy when he felt weak. The phlebotomy was stopped at that time. Blood pressure was noted to be low at 60/30 and the patient was diaphoretic. The patient was laid down in the recliner and a liter of normal saline was started wide open. After possibly 5-7 minutes patient started to feel better and blood pressure was up to 89/57 with a pulse of 54 after approximately 300 mL of normal saline. He was able to sit up without any dizziness.  Instructed nursing to continue the normal saline and to continue to observe the patient for approximately 15-20 minutes. If his vital signs remained stable and he is no longer dizzy or diaphoretic, he may be discharged to home.

## 2016-11-06 NOTE — Patient Instructions (Signed)
Therapeutic Phlebotomy Therapeutic phlebotomy is the controlled removal of blood from a person's body for the purpose of treating a medical condition. The procedure is similar to donating blood. Usually, about a pint (470 mL, or 0.47L) of blood is removed. The average adult has 9-12 pints (4.3-5.7 L) of blood. Therapeutic phlebotomy may be used to treat the following medical conditions:  Hemochromatosis. This is a condition in which the blood contains too much iron.  Polycythemia vera. This is a condition in which the blood contains too many red blood cells.  Porphyria cutanea tarda. This is a disease in which an important part of hemoglobin is not made properly. It results in the buildup of abnormal amounts of porphyrins in the body.  Sickle cell disease. This is a condition in which the red blood cells form an abnormal crescent shape rather than a round shape. Tell a health care provider about:  Any allergies you have.  All medicines you are taking, including vitamins, herbs, eye drops, creams, and over-the-counter medicines.  Any problems you or family members have had with anesthetic medicines.  Any blood disorders you have.  Any surgeries you have had.  Any medical conditions you have. What are the risks? Generally, this is a safe procedure. However, problems may occur, including:  Nausea or light-headedness.  Low blood pressure.  Soreness, bleeding, swelling, or bruising at the needle insertion site.  Infection. What happens before the procedure?  Follow instructions from your health care provider about eating or drinking restrictions.  Ask your health care provider about changing or stopping your regular medicines. This is especially important if you are taking diabetes medicines or blood thinners.  Wear clothing with sleeves that can be raised above the elbow.  Plan to have someone take you home after the procedure.  You may have a blood sample taken. What happens  during the procedure?  A needle will be inserted into one of your veins.  Tubing and a collection bag will be attached to that needle.  Blood will flow through the needle and tubing into the collection bag.  You may be asked to open and close your hand slowly and continually during the entire collection.  After the specified amount of blood has been removed from your body, the collection bag and tubing will be clamped.  The needle will be removed from your vein.  Pressure will be held on the site of the needle insertion to stop the bleeding.  A bandage (dressing) will be placed over the needle insertion site. The procedure may vary among health care providers and hospitals. What happens after the procedure?  Your recovery will be assessed and monitored.  You can return to your normal activities as directed by your health care provider. This information is not intended to replace advice given to you by your health care provider. Make sure you discuss any questions you have with your health care provider. Document Released: 10/30/2010 Document Revised: 01/28/2016 Document Reviewed: 05/24/2014 Elsevier Interactive Patient Education  2017 Elsevier Inc.  

## 2016-11-30 ENCOUNTER — Other Ambulatory Visit: Payer: Self-pay | Admitting: Nurse Practitioner

## 2016-12-11 DIAGNOSIS — G8929 Other chronic pain: Secondary | ICD-10-CM | POA: Insufficient documentation

## 2016-12-11 DIAGNOSIS — M25551 Pain in right hip: Secondary | ICD-10-CM

## 2017-01-22 ENCOUNTER — Other Ambulatory Visit: Payer: Medicaid Other

## 2017-02-09 ENCOUNTER — Other Ambulatory Visit: Payer: Self-pay | Admitting: Nurse Practitioner

## 2017-02-13 ENCOUNTER — Other Ambulatory Visit (HOSPITAL_BASED_OUTPATIENT_CLINIC_OR_DEPARTMENT_OTHER): Payer: Medicaid Other

## 2017-02-13 LAB — CBC WITH DIFFERENTIAL/PLATELET
BASO%: 0.2 % (ref 0.0–2.0)
Basophils Absolute: 0 10*3/uL (ref 0.0–0.1)
EOS%: 8.8 % — AB (ref 0.0–7.0)
Eosinophils Absolute: 0.5 10*3/uL (ref 0.0–0.5)
HEMATOCRIT: 42.2 % (ref 38.4–49.9)
HEMOGLOBIN: 14.3 g/dL (ref 13.0–17.1)
LYMPH#: 1.8 10*3/uL (ref 0.9–3.3)
LYMPH%: 33.6 % (ref 14.0–49.0)
MCH: 31.5 pg (ref 27.2–33.4)
MCHC: 33.9 g/dL (ref 32.0–36.0)
MCV: 93 fL (ref 79.3–98.0)
MONO#: 0.7 10*3/uL (ref 0.1–0.9)
MONO%: 12.4 % (ref 0.0–14.0)
NEUT%: 45 % (ref 39.0–75.0)
NEUTROS ABS: 2.5 10*3/uL (ref 1.5–6.5)
PLATELETS: 114 10*3/uL — AB (ref 140–400)
RBC: 4.54 10*6/uL (ref 4.20–5.82)
RDW: 14.1 % (ref 11.0–14.6)
WBC: 5.5 10*3/uL (ref 4.0–10.3)

## 2017-02-13 LAB — IRON AND TIBC
%SAT: 61 % — ABNORMAL HIGH (ref 20–55)
Iron: 229 ug/dL — ABNORMAL HIGH (ref 42–163)
TIBC: 375 ug/dL (ref 202–409)
UIBC: 146 ug/dL (ref 117–376)

## 2017-02-13 LAB — FERRITIN: FERRITIN: 33 ng/mL (ref 22–316)

## 2017-02-18 ENCOUNTER — Telehealth: Payer: Self-pay | Admitting: *Deleted

## 2017-02-18 NOTE — Telephone Encounter (Signed)
Called pt and left message on voice mail re:  Informed pt of lab results.  Left message of appt for phlebotomy on 02/28/17 as per Dr. Latanya MaudlinFeng's instructions.

## 2017-02-18 NOTE — Telephone Encounter (Signed)
-----   Message from Malachy MoodYan Feng, MD sent at 02/17/2017  8:07 PM EDT ----- Please let pt know his lab result, I will schedule phlebotomy.  [name], Yan  02/17/2017

## 2017-02-28 ENCOUNTER — Ambulatory Visit (HOSPITAL_BASED_OUTPATIENT_CLINIC_OR_DEPARTMENT_OTHER): Payer: Medicaid Other

## 2017-02-28 NOTE — Patient Instructions (Signed)

## 2017-02-28 NOTE — Progress Notes (Signed)
Phlebotomy started at 0944 in R AC using 16G phlebotomy kit. 0948 Pt stated "I am starting to perspire" Pt wanted to proceed with phlebotomy. Ended at 0949 withdrawing 511 units. Pt tolerated well. Pt offered food and drink post phlebotomy, refused. Pt stated he ate before procedure, and brought his drink.  Pt stayed for 30 minute post phlebotomy observation. VSS at discharge, and pt stable. 

## 2017-04-24 ENCOUNTER — Other Ambulatory Visit (HOSPITAL_BASED_OUTPATIENT_CLINIC_OR_DEPARTMENT_OTHER): Payer: Medicaid Other

## 2017-04-24 LAB — CBC WITH DIFFERENTIAL/PLATELET
BASO%: 0.5 % (ref 0.0–2.0)
BASOS ABS: 0 10*3/uL (ref 0.0–0.1)
EOS%: 11.1 % — AB (ref 0.0–7.0)
Eosinophils Absolute: 0.5 10*3/uL (ref 0.0–0.5)
HEMATOCRIT: 43.8 % (ref 38.4–49.9)
HGB: 14.6 g/dL (ref 13.0–17.1)
LYMPH%: 26.7 % (ref 14.0–49.0)
MCH: 31 pg (ref 27.2–33.4)
MCHC: 33.3 g/dL (ref 32.0–36.0)
MCV: 93.2 fL (ref 79.3–98.0)
MONO#: 0.5 10*3/uL (ref 0.1–0.9)
MONO%: 10.5 % (ref 0.0–14.0)
NEUT#: 2.4 10*3/uL (ref 1.5–6.5)
NEUT%: 51.2 % (ref 39.0–75.0)
Platelets: 102 10*3/uL — ABNORMAL LOW (ref 140–400)
RBC: 4.69 10*6/uL (ref 4.20–5.82)
RDW: 14.7 % — ABNORMAL HIGH (ref 11.0–14.6)
WBC: 4.6 10*3/uL (ref 4.0–10.3)
lymph#: 1.2 10*3/uL (ref 0.9–3.3)

## 2017-04-24 LAB — FERRITIN: Ferritin: 30 ng/ml (ref 22–316)

## 2017-04-24 LAB — IRON AND TIBC
%SAT: 58 % — ABNORMAL HIGH (ref 20–55)
Iron: 233 ug/dL — ABNORMAL HIGH (ref 42–163)
TIBC: 398 ug/dL (ref 202–409)
UIBC: 165 ug/dL (ref 117–376)

## 2017-04-30 ENCOUNTER — Telehealth: Payer: Self-pay | Admitting: *Deleted

## 2017-04-30 NOTE — Telephone Encounter (Signed)
-----   Message from Malachy MoodYan Feng, MD sent at 04/28/2017 10:21 PM EST ----- Please let pt know and schedule his phlebotomy, thanks   [name], Yan  04/28/2017

## 2017-04-30 NOTE — Telephone Encounter (Signed)
Spoke with pt and informed pt of iron studies results.  Pt understood that a scheduler will contact pt with appt for phlebotomy.

## 2017-05-01 ENCOUNTER — Telehealth: Payer: Self-pay | Admitting: Hematology

## 2017-05-01 NOTE — Telephone Encounter (Signed)
Spoke with his SO and scheduled phleb on 12/4 per 11/20 sch msg. OK per Arline Aspindy.

## 2017-06-03 ENCOUNTER — Telehealth: Payer: Self-pay | Admitting: Hematology

## 2017-06-03 NOTE — Telephone Encounter (Signed)
Left message for patient to return call. Patient left message on VM of Nutritional therapistswitchboard operator.

## 2017-06-19 ENCOUNTER — Telehealth: Payer: Self-pay | Admitting: Hematology

## 2017-06-19 NOTE — Telephone Encounter (Signed)
Patient called to schedule appt missed

## 2017-06-20 ENCOUNTER — Inpatient Hospital Stay: Payer: Medicaid Other | Attending: Hematology

## 2017-06-20 NOTE — Patient Instructions (Signed)
Therapeutic Phlebotomy, Care After Refer to this sheet in the next few weeks. These instructions provide you with information about caring for yourself after your procedure. Your health care provider may also give you more specific instructions. Your treatment has been planned according to current medical practices, but problems sometimes occur. Call your health care provider if you have any problems or questions after your procedure. What can I expect after the procedure? After the procedure, it is common to have:  Light-headedness or dizziness. You may feel faint.  Nausea.  Tiredness.  Follow these instructions at home: Activity  Return to your normal activities as directed by your health care provider. Most people can go back to their normal activities right away.  Avoid strenuous physical activity and heavy lifting or pulling for about 5 hours after the procedure. Do not lift anything that is heavier than 10 lb (4.5 kg).  Athletes should avoid strenuous exercise for at least 12 hours.  Change positions slowly for the remainder of the day. This will help to prevent light-headedness or fainting.  If you feel light-headed, lie down until the feeling goes away. Eating and drinking  Be sure to eat well-balanced meals for the next 24 hours.  Drink enough fluid to keep your urine clear or pale yellow.  Avoid drinking alcohol on the day that you had the procedure. Care of the Needle Insertion Site  Keep your bandage dry. You can remove the bandage after about 5 hours or as directed by your health care provider.  If you have bleeding from the needle insertion site, elevate your arm and press firmly on the site until the bleeding stops.  If you have bruising at the site, apply ice to the area: ? Put ice in a plastic bag. ? Place a towel between your skin and the bag. ? Leave the ice on for 20 minutes, 2-3 times a day for the first 24 hours.  If the swelling does not go away after  24 hours, apply a warm, moist washcloth to the area for 20 minutes, 2-3 times a day. General instructions  Avoid smoking for at least 30 minutes after the procedure.  Keep all follow-up visits as directed by your health care provider. It is important to continue with further therapeutic phlebotomy treatments as directed. Contact a health care provider if:  You have redness, swelling, or pain at the needle insertion site.  You have fluid, blood, or pus coming from the needle insertion site.  You feel light-headed, dizzy, or nauseated, and the feeling does not go away.  You notice new bruising at the needle insertion site.  You feel weaker than normal.  You have a fever or chills. Get help right away if:  You have severe nausea or vomiting.  You have chest pain.  You have trouble breathing. This information is not intended to replace advice given to you by your health care provider. Make sure you discuss any questions you have with your health care provider. Document Released: 10/30/2010 Document Revised: 01/28/2016 Document Reviewed: 05/24/2014 Elsevier Interactive Patient Education  2018 [employer]   Therapeutic Phlebotomy Discharge Instructions  - Increase your fluid intake over the next 4 hours  - No smoking for 30 minutes  - Avoid using the affected arm (the one you had the blood drawn from) for heavy lifting or other activities.  - You may resume all normal activities after 30 minutes.  You are to notify the office if you experience:   -   Persistent dizziness and/or lightheadedness -Uncontrolled or excessive bleeding at the site.   

## 2017-07-23 NOTE — Progress Notes (Signed)
[facility] HEMATOLOGY OFFICE PROGRESS NOTE  Date of Service:  07/25/2017   DIAGNOSIS:  1. Hemochromatosis diagnosed in 2007 in [place]  2. 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 Last phlebotomy in 06/30/2017.  PROBLEM LIST 1. Hemochromatosis, diagnosed in 2007 in [place]. 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  2. Diabetes, diagnosed in 2007, managed by his endocrinologist at [facility]. 3. Hepatitis C and liver cirrhosis. This is managed by Dr. Gardiner Coins and Dr. Donnal Debar  at [facility] He received and anti-hepatitis C treatment in 2015 and his virus title became undetectable afterwards. 4. History of heart failure.  INTERIM HISTORY:  John [name] returns for follow up of his Hemochromatosis and thrombocytosis. He was last seen by me 9 months ago. Of note, since last visit he has had 3 therapeutic phlebotomies in 10/2016, 02/2017, 06/2017.  He presents to the clinic today noting he is doing well. He noted he fainted during 02/2017 phlebotomy towards the end of procedure. He notes he asked nurse to stop procedure but was told he had to proceed. Otherwise he has tolerated phlebotomies.   MEDICAL HISTORY: Past Medical History:  Diagnosis Date  . Anxiety   . Diabetes (HCC)   . Foot pain left   to have MRI tues.   . Hepatitis C   . High cholesterol   Hemochromatosis (Iron Overload)  ALLERGIES:  is allergic to sulfa antibiotics; sulfamethoxazole; and testosterone.  MEDICATIONS:  Allergies as of 07/25/2017      Reactions   Sulfa Antibiotics Swelling   Of tongue   Sulfamethoxazole Swelling   Testosterone Rash, Dermatitis      Medication List        Accurate as of 07/25/17  6:25 PM. Always use your most recent med list.          B-D ULTRAFINE  III SHORT PEN 31G X 8 MM Misc Generic drug:  Insulin Pen Needle Injects 4 times daily   gabapentin 100 MG capsule Commonly known as:  NEURONTIN Take 1 capsule (100 mg total) by mouth 2 (two) times daily.   gabapentin 300 MG capsule Commonly known as:  NEURONTIN TAKE ONE CAPSULE BY MOUTH AT BEDTIME   glucagon 1 MG injection Inject 1 mg into the vein once as needed.   insulin aspart 100 UNIT/ML injection Commonly known as:  novoLOG Inject 0-20 Units into the skin 3 (three) times daily before meals. Sliding scale   insulin glargine 100 UNIT/ML injection Commonly known as:  LANTUS Inject 50 Units into the skin daily.   lactulose 10 GM/15ML solution Commonly known as:  CHRONULAC Take 10 g by mouth daily as needed.   propranolol 40 MG tablet Commonly known as:  INDERAL Take 40 mg by mouth 2 (two) times daily.       SURGICAL HISTORY:  Past Surgical History:  Procedure Laterality Date  . None      REVIEW OF SYSTEMS:   Constitutional: Denies fevers, chills or abnormal weight loss Eyes: Denies blurriness of vision Ears, nose, mouth, throat, and face: Denies mucositis   Respiratory:denies wheezes   Cardiovascular: Denies palpitation, chest discomfort or lower extremity swelling Gastrointestinal:  Denies nausea, heartburn or change in bowel habits Skin: Denies abnormal skin rashes Lymphatics: Denies new lymphadenopathy or easy bruising Neurological:Denies numbness,  tingling or new weaknesses Behavioral/Psych: Mood is stable, no new changes  All other systems were reviewed with the patient and are negative.  PHYSICAL EXAMINATION: ECOG PERFORMANCE STATUS: 2  Blood pressure 120/72, pulse 72, temperature 98.2 F (36.8 C), resp. rate 18, height 6' (1.829 m), weight 176 lb 9.6 oz (80.1 kg), SpO2 100 %.  GENERAL:alert, no distress and comfortable; bearded thin male.  SKIN: skin color, texture, turgor are normal, no rashes or significant lesions EYES: normal, Conjunctiva are  pink and non-injected, sclera clear OROPHARYNX:no exudate, and lips, buccal mucosa, and tongue normal (+) erythema on throat NECK: supple, thyroid normal size, non-tender, without nodularity LYMPH:  no palpable lymphadenopathy in the cervical, axillary or supraclavicular LUNGS: clear to auscultation and percussion with normal breathing effort HEART: regular rate & rhythm and no murmurs and no lower extremity edema ABDOMEN:abdomen soft, non-tender and normal bowel sounds; enlarged spleen about 2 cm below costaphrenic angle. No additional stigmata of liver disease.  Musculoskeletal:no cyanosis of digits and no clubbing  NEURO: alert & oriented x 3 with fluent speech, no focal motor/sensory deficits  LABORATORY DATA: Labs:  CBC Latest Ref Rng & Units 07/25/2017 04/24/2017 02/13/2017  WBC 4.0 - 10.3 K/uL 6.5 4.6 5.5  Hemoglobin 13.0 - 17.1 g/dL 16.114.6 09.614.6 04.514.3  Hematocrit 38.4 - 49.9 % 43.5 43.8 42.2  Platelets 140 - 400 K/uL 136(L) 102(L) 114(L)    CMP Latest Ref Rng & Units 05/13/2014 01/28/2014 10/07/2013  Glucose 70 - 140 mg/dl 409(W177(H) 119(J215(H) 478(G356(H)  BUN 7.0 - 26.0 mg/dL 95.610.8 21.313.7 08.611.9  Creatinine 0.7 - 1.3 mg/dL 0.8 0.9 0.9  Sodium 578136 - 145 mEq/L 138 136 133(L)  Potassium 3.5 - 5.1 mEq/L 4.0 3.9 4.2  Chloride 96 - 112 mEq/L - - -  CO2 22 - 29 mEq/L 29 28 23   Calcium 8.4 - 10.4 mg/dL 9.1 8.8 8.4  Total Protein 6.4 - 8.3 g/dL 7.5 6.7 7.0  Total Bilirubin 0.20 - 1.20 mg/dL 4.690.41 6.291.02 5.280.48  Alkaline Phos 40 - 150 U/L 142 130 170(H)  AST 5 - 34 U/L 52(H) 32 131(H)  ALT 0 - 55 U/L 41 19 88(H)   Results for John RevelLANGLEY, John [name] (MRN [id number]) as of 07/25/2017 18:21  Ref. Range 10/22/2016 10:24 02/13/2017 12:24 04/24/2017 08:49 07/25/2017 08:51  Iron Latest Ref Range: 42 - 163 ug/dL 272161 536229 (H) 644233 (H) 67  UIBC Latest Units: ug/dL 034211 742146 595165 638348  TIBC Latest Ref Range: 202 - 409 ug/dL 756373 433375 295398 188415 (H)  %SAT Latest Ref Range: 20 - 55 % 43 61 (H) 58 (H)   Saturation Ratios Latest Ref Range: 42 -  163 %    16 (L)  Ferritin Latest Ref Range: 22 - 316 ng/mL 53 33 30 27     RADIOGRAPHIC STUDIES:  MR BRAIN WO CONTRAST 09/21/2013 IMPRESSION: Mildly abnormal MRI brain (without) demonstrating: 1. Scattered periventricular, subcortical and juxtacortical foci of non-specific T2 hyperintensities. These findings are non-specific and considerations include autoimmune, inflammatory, post-infectious, microvascular ischemic or migraine associated etiologies. 2. Slightly hyperintense T1 signal in the bilateral globus pallidi. This is non-specific but can be seen in association with liver disease and mineral deposition. 3. No acute findings   ASSESSMENT: John [name] 62 y.o. male with a history of Hepatitis C and Hemochromatosis.   PLAN:  1. Hereditary hemochromatosis.  --We discuss the goal is maintaining his ferritin less than 50 and transferrin saturation <50%).  -He has been compliant with lab and phlebotomy  as needed every 2-3 months, but did miss a few appointments last year  -We previously discussed low iron diet. He has cut back on red meat intake, I encourage him to eat less red meat, and consider chicken, [place], seafood etc. as his main protein resource. -He had experienced 1 episode of syncope during phlebotomy. I strongly encouraged him to make sure he drinks plenty of fluid prior to procedure.  -Last ECHO in 2016 was normal. He has been following up with Dr. Donnal Debar for his liver  -I recommend in the future to do fasting labs as iron rich foods can effect his results. He is agreeable.  -Labs reviewed with him, mild thrombocytopenia improved.  -He has required phlebotomy every 2-3 months lately, will repeat labs every 2 months with phlebotomy as needed.  -Lab review today, no need phlebotomy this time  -F/u in 8 months  2. Cytopenia -His mild anemia is likely secondary to phlebotomy and iron deficiency, resolved now  -His thrombocytopenia is likely secondary to liver cirrhosis and  splenomegaly, improving, no signs of bleeding. Continue to watch closely.  4. DM2 secondary #1.  --Continue insulin sliding scale and follow-up with [facility].   5. 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  --He follows at with DR. Bruggen every 6 months at [facility] and finished treatment for HepC in 2015 --Continue nadolol. He was counseled to report any bleeding.  --His HCV level is non-detected (labs in Care Everywhere) with normalization of his transiminases.  -He is under [facility] surveillance by ultrasound and lab every 6 months by Dr. Donnal Debar   6. History of CHF -I previously discussed his echo results from October 2016, which showed normal EF and normal structure.    Follow-up.  -Lab every 2 months X4, will set up a phlebotomy if needed (ferritin more than 50, or transferrin saturation more than 50%) -F/u in 8 months    All questions were answered. The patient knows to call the clinic with any problems, questions or concerns. We can certainly see the patient much sooner if necessary.  I spent 15 minutes counseling the patient face to face. The total time spent in the appointment was 20 minutes.  This document serves as a record of services personally performed by Malachy Mood, MD. It was created on her behalf by Delphina Cahill, a trained medical scribe. The creation of this record is based on the scribe's personal observations and the provider's statements to them.    I have reviewed the above documentation for accuracy and completeness, and I agree with the above.   Malachy Mood 07/25/2017

## 2017-07-25 ENCOUNTER — Inpatient Hospital Stay: Payer: Medicaid Other

## 2017-07-25 ENCOUNTER — Telehealth: Payer: Self-pay

## 2017-07-25 ENCOUNTER — Encounter: Payer: Self-pay | Admitting: Hematology

## 2017-07-25 ENCOUNTER — Inpatient Hospital Stay: Payer: Medicaid Other | Attending: Hematology | Admitting: Hematology

## 2017-07-25 DIAGNOSIS — E119 Type 2 diabetes mellitus without complications: Secondary | ICD-10-CM | POA: Insufficient documentation

## 2017-07-25 DIAGNOSIS — R55 Syncope and collapse: Secondary | ICD-10-CM | POA: Insufficient documentation

## 2017-07-25 DIAGNOSIS — Z79899 Other long term (current) drug therapy: Secondary | ICD-10-CM | POA: Diagnosis not present

## 2017-07-25 DIAGNOSIS — K7460 Unspecified cirrhosis of liver: Secondary | ICD-10-CM | POA: Insufficient documentation

## 2017-07-25 DIAGNOSIS — E7800 Pure hypercholesterolemia, unspecified: Secondary | ICD-10-CM | POA: Insufficient documentation

## 2017-07-25 DIAGNOSIS — D6959 Other secondary thrombocytopenia: Secondary | ICD-10-CM | POA: Diagnosis not present

## 2017-07-25 DIAGNOSIS — Z882 Allergy status to sulfonamides status: Secondary | ICD-10-CM | POA: Insufficient documentation

## 2017-07-25 DIAGNOSIS — Z794 Long term (current) use of insulin: Secondary | ICD-10-CM | POA: Insufficient documentation

## 2017-07-25 DIAGNOSIS — B1920 Unspecified viral hepatitis C without hepatic coma: Secondary | ICD-10-CM | POA: Insufficient documentation

## 2017-07-25 LAB — CBC WITH DIFFERENTIAL/PLATELET
Basophils Absolute: 0 10*3/uL (ref 0.0–0.1)
Basophils Relative: 1 %
EOS PCT: 7 %
Eosinophils Absolute: 0.5 10*3/uL (ref 0.0–0.5)
HCT: 43.5 % (ref 38.4–49.9)
Hemoglobin: 14.6 g/dL (ref 13.0–17.1)
LYMPHS ABS: 1.8 10*3/uL (ref 0.9–3.3)
LYMPHS PCT: 27 %
MCH: 31.3 pg (ref 27.2–33.4)
MCHC: 33.6 g/dL (ref 32.0–36.0)
MCV: 93.3 fL (ref 79.3–98.0)
MONOS PCT: 10 %
Monocytes Absolute: 0.7 10*3/uL (ref 0.1–0.9)
Neutro Abs: 3.6 10*3/uL (ref 1.5–6.5)
Neutrophils Relative %: 55 %
PLATELETS: 136 10*3/uL — AB (ref 140–400)
RBC: 4.66 MIL/uL (ref 4.20–5.82)
RDW: 14.2 % (ref 11.0–14.6)
WBC: 6.5 10*3/uL (ref 4.0–10.3)

## 2017-07-25 LAB — IRON AND TIBC
Iron: 67 ug/dL (ref 42–163)
SATURATION RATIOS: 16 % — AB (ref 42–163)
TIBC: 415 ug/dL — ABNORMAL HIGH (ref 202–409)
UIBC: 348 ug/dL

## 2017-07-25 LAB — FERRITIN: Ferritin: 27 ng/mL (ref 22–316)

## 2017-07-25 NOTE — Telephone Encounter (Signed)
Printed avs and calender of upcoming appointment. Per 2/13 los 

## 2017-07-30 ENCOUNTER — Telehealth: Payer: Self-pay | Admitting: *Deleted

## 2017-07-30 NOTE — Telephone Encounter (Signed)
Called pt at home and left message on voice mail re:  Iron level good, no need for phlebotomy this time as per Dr. Latanya MaudlinFeng's instructions.

## 2017-07-30 NOTE — Telephone Encounter (Signed)
-----   Message from Malachy MoodYan Feng, MD sent at 07/26/2017 11:15 PM EST ----- Please let pt know his iron level is good, no need phlebotomy this time, thanks.  Malachy MoodYan Feng  07/26/2017

## 2017-09-19 ENCOUNTER — Inpatient Hospital Stay: Payer: Medicaid Other | Attending: Hematology

## 2017-09-19 DIAGNOSIS — D6959 Other secondary thrombocytopenia: Secondary | ICD-10-CM | POA: Insufficient documentation

## 2017-09-19 LAB — IRON AND TIBC
Iron: 320 ug/dL — ABNORMAL HIGH (ref 42–163)
Saturation Ratios: 86 % (ref 42–163)
TIBC: 373 ug/dL (ref 202–409)
UIBC: 53 ug/dL

## 2017-09-19 LAB — CBC WITH DIFFERENTIAL/PLATELET
Basophils Absolute: 0 10*3/uL (ref 0.0–0.1)
Basophils Relative: 0 %
Eosinophils Absolute: 0.4 10*3/uL (ref 0.0–0.5)
Eosinophils Relative: 7 %
HCT: 42.2 % (ref 38.4–49.9)
HEMOGLOBIN: 14.5 g/dL (ref 13.0–17.1)
LYMPHS ABS: 1.5 10*3/uL (ref 0.9–3.3)
LYMPHS PCT: 30 %
MCH: 31.6 pg (ref 27.2–33.4)
MCHC: 34.4 g/dL (ref 32.0–36.0)
MCV: 91.9 fL (ref 79.3–98.0)
MONOS PCT: 11 %
Monocytes Absolute: 0.6 10*3/uL (ref 0.1–0.9)
NEUTROS PCT: 52 %
Neutro Abs: 2.5 10*3/uL (ref 1.5–6.5)
Platelets: 131 10*3/uL — ABNORMAL LOW (ref 140–400)
RBC: 4.59 MIL/uL (ref 4.20–5.82)
RDW: 13.6 % (ref 11.0–14.6)
WBC: 4.9 10*3/uL (ref 4.0–10.3)

## 2017-09-19 LAB — FERRITIN: Ferritin: 32 ng/mL (ref 22–316)

## 2017-09-24 ENCOUNTER — Telehealth: Payer: Self-pay | Admitting: Hematology

## 2017-09-24 ENCOUNTER — Telehealth: Payer: Self-pay | Admitting: *Deleted

## 2017-09-24 NOTE — Telephone Encounter (Signed)
Scheduled appt per 4/15 sch msg - left voicemail for patient regarding appts.

## 2017-09-24 NOTE — Telephone Encounter (Signed)
Spoke with pt and informed pt of Iron level.  Pt stated he was aware of phlebotomy appt for Friday  09/27/17.

## 2017-09-24 NOTE — Telephone Encounter (Signed)
-----   Message from Malachy MoodYan Feng, MD sent at 09/21/2017 11:42 AM EDT ----- Please let him know the lab result, and schedule his phlebotomy, thanks.  Malachy MoodYan Feng  09/21/2017

## 2017-09-27 ENCOUNTER — Inpatient Hospital Stay: Payer: Medicaid Other

## 2017-10-02 ENCOUNTER — Telehealth: Payer: Self-pay | Admitting: Hematology

## 2017-10-02 NOTE — Telephone Encounter (Signed)
Appointment scheduled and left message for patient with Date/Time per 4/23 sch msg

## 2017-10-03 ENCOUNTER — Telehealth: Payer: Self-pay

## 2017-10-03 NOTE — Telephone Encounter (Signed)
Called patient and left voice message informing him he has an appointment for 10/10/17 at 10:30 am for phlebotomy per Dr. Mosetta PuttFeng.

## 2017-10-08 ENCOUNTER — Telehealth: Payer: Self-pay

## 2017-10-08 NOTE — Telephone Encounter (Signed)
Called patient told he will not need phlebotomy this week per Dr. Mosetta Putt.  To keep scheduled appointment the beginning of June.  Patient verbalized an understanding.

## 2017-10-08 NOTE — Telephone Encounter (Signed)
Patient calls stating he had phlebotomy on 09/27/17 received a call for another schedule phlebotomy on 10/11/17.  He wants to make sure he needs it. Stating he has not had any repeat blood work.

## 2017-10-10 ENCOUNTER — Inpatient Hospital Stay: Payer: Medicaid Other

## 2017-10-29 ENCOUNTER — Telehealth: Payer: Self-pay | Admitting: Hematology

## 2017-10-29 NOTE — Telephone Encounter (Signed)
Provider out of office in am. Spoke w/ pt to reschedule appt to 1:15 pm on 6/6.

## 2017-11-14 ENCOUNTER — Other Ambulatory Visit: Payer: Medicaid Other

## 2017-11-14 ENCOUNTER — Ambulatory Visit: Payer: Medicaid Other | Admitting: Hematology

## 2017-11-14 ENCOUNTER — Inpatient Hospital Stay: Payer: Medicaid Other | Attending: Hematology

## 2017-11-14 ENCOUNTER — Encounter: Payer: Self-pay | Admitting: Hematology

## 2017-11-14 ENCOUNTER — Inpatient Hospital Stay (HOSPITAL_BASED_OUTPATIENT_CLINIC_OR_DEPARTMENT_OTHER): Payer: Medicaid Other | Admitting: Hematology

## 2017-11-14 DIAGNOSIS — Z79899 Other long term (current) drug therapy: Secondary | ICD-10-CM

## 2017-11-14 DIAGNOSIS — Z794 Long term (current) use of insulin: Secondary | ICD-10-CM | POA: Insufficient documentation

## 2017-11-14 DIAGNOSIS — E7800 Pure hypercholesterolemia, unspecified: Secondary | ICD-10-CM

## 2017-11-14 DIAGNOSIS — B1920 Unspecified viral hepatitis C without hepatic coma: Secondary | ICD-10-CM | POA: Diagnosis not present

## 2017-11-14 DIAGNOSIS — R42 Dizziness and giddiness: Secondary | ICD-10-CM | POA: Insufficient documentation

## 2017-11-14 DIAGNOSIS — K7469 Other cirrhosis of liver: Secondary | ICD-10-CM

## 2017-11-14 DIAGNOSIS — K7460 Unspecified cirrhosis of liver: Secondary | ICD-10-CM

## 2017-11-14 DIAGNOSIS — I509 Heart failure, unspecified: Secondary | ICD-10-CM

## 2017-11-14 DIAGNOSIS — D696 Thrombocytopenia, unspecified: Secondary | ICD-10-CM | POA: Diagnosis not present

## 2017-11-14 DIAGNOSIS — E119 Type 2 diabetes mellitus without complications: Secondary | ICD-10-CM | POA: Insufficient documentation

## 2017-11-14 DIAGNOSIS — B182 Chronic viral hepatitis C: Secondary | ICD-10-CM

## 2017-11-14 LAB — IRON AND TIBC
Iron: 65 ug/dL (ref 42–163)
Saturation Ratios: 16 % — ABNORMAL LOW (ref 42–163)
TIBC: 401 ug/dL (ref 202–409)
UIBC: 336 ug/dL

## 2017-11-14 LAB — CBC WITH DIFFERENTIAL/PLATELET
BASOS ABS: 0 10*3/uL (ref 0.0–0.1)
BASOS PCT: 0 %
EOS ABS: 0.3 10*3/uL (ref 0.0–0.5)
EOS PCT: 7 %
HCT: 39.9 % (ref 38.4–49.9)
Hemoglobin: 13.4 g/dL (ref 13.0–17.1)
LYMPHS PCT: 29 %
Lymphs Abs: 1.3 10*3/uL (ref 0.9–3.3)
MCH: 30.5 pg (ref 27.2–33.4)
MCHC: 33.6 g/dL (ref 32.0–36.0)
MCV: 90.9 fL (ref 79.3–98.0)
Monocytes Absolute: 0.4 10*3/uL (ref 0.1–0.9)
Monocytes Relative: 9 %
Neutro Abs: 2.4 10*3/uL (ref 1.5–6.5)
Neutrophils Relative %: 55 %
PLATELETS: 116 10*3/uL — AB (ref 140–400)
RBC: 4.39 MIL/uL (ref 4.20–5.82)
RDW: 14.6 % (ref 11.0–14.6)
WBC: 4.3 10*3/uL (ref 4.0–10.3)

## 2017-11-14 LAB — FERRITIN: FERRITIN: 24 ng/mL (ref 22–316)

## 2017-11-14 NOTE — Progress Notes (Signed)
[facility] HEMATOLOGY OFFICE PROGRESS NOTE  Date of Service:  11/14/2017   DIAGNOSIS:  1. Hemochromatosis diagnosed in 2007 in [place]  2. 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 Last phlebotomy in 06/30/2017.  PROBLEM LIST 1. Hemochromatosis, diagnosed in 2007 in [place]. 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  2. Diabetes, diagnosed in 2007, managed by his endocrinologist at [facility]. 3. Hepatitis C and liver cirrhosis. This is managed by Dr. Gardiner Coins and Dr. Donnal Debar  at [facility]. He received and anti-hepatitis C treatment in 2015 and his virus title became undetectable afterwards. 4. History of heart failure.  INTERIM HISTORY:  MITSUGI SCHRADER returns for follow up of his Hemochromatosis and thrombocytosis. He was last seen by me 4 months ago. He notes he went to his PCP last month due to feeling dizzy when he lays down often. He notes to having this feeling when looking up when standing or standing too fast. His PCP had blood work done which was inconclusive. He has not seen his neurologist Dr. 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 He continues to see Dr. Donnal Debar every 6 months.    On review of symptoms, pt notes his shakes are still there which is more present with anxiety or stress.     MEDICAL HISTORY: Past Medical History:  Diagnosis Date  . Anxiety   . Diabetes (HCC)   . Foot pain left   to have MRI tues.   . Hepatitis C   . High cholesterol   Hemochromatosis (Iron Overload)  ALLERGIES:  is allergic to sulfa antibiotics; sulfamethoxazole; and testosterone.  MEDICATIONS:  Allergies as of 11/14/2017      Reactions   Sulfa Antibiotics Swelling   Of tongue   Sulfamethoxazole Swelling   Testosterone Rash, Dermatitis      Medication List        Accurate as of 11/14/17  5:14 PM. Always use your most recent med list.          B-D ULTRAFINE III SHORT PEN 31G X 8 MM Misc Generic drug:  Insulin Pen Needle Injects 4 times daily   gabapentin 100 MG capsule Commonly known as:  NEURONTIN Take 1 capsule (100 mg total) by mouth 2 (two) times daily.   gabapentin 300 MG capsule Commonly known as:  NEURONTIN TAKE ONE CAPSULE BY MOUTH AT BEDTIME   glucagon 1 MG injection Inject 1 mg into the vein once as needed.   insulin aspart 100 UNIT/ML injection Commonly known as:  novoLOG Inject 0-20 Units into the skin 3 (three) times daily before meals. Sliding scale   insulin glargine 100 UNIT/ML injection Commonly known as:  LANTUS Inject 50 Units into the skin daily.   lactulose 10 GM/15ML solution Commonly known as:  CHRONULAC Take 10 g by mouth daily as needed.   propranolol 40 MG tablet Commonly known as:  INDERAL Take 40 mg by mouth 2 (two) times daily.       SURGICAL HISTORY:  Past Surgical History:  Procedure Laterality Date  . None      REVIEW OF SYSTEMS:   Constitutional: Denies fevers, chills or abnormal weight loss Eyes: Denies blurriness of vision Ears, nose, mouth, throat, and face: Denies  mucositis   Respiratory:denies wheezes   Cardiovascular: Denies palpitation, chest discomfort or lower extremity swelling Gastrointestinal:  Denies nausea, heartburn or change in bowel habits Skin: Denies abnormal skin rashes Lymphatics: Denies new lymphadenopathy or easy bruising Neurological:Denies numbness, tingling or new weaknesses (+) intermittent, positional dizziness with room spinning Behavioral/Psych: Mood is stable, no new changes  All other systems were reviewed with the patient and are negative.  PHYSICAL EXAMINATION: ECOG PERFORMANCE STATUS: 2  Blood pressure 124/72, pulse 80, temperature 98.2 F (36.8 C), temperature source Oral, resp. rate  18, height 6' (1.829 m), weight 179 lb 3.2 oz (81.3 kg), SpO2 98 %.  GENERAL:alert, no distress and comfortable; bearded thin male.  SKIN: skin color, texture, turgor are normal, no rashes or significant lesions EYES: normal, Conjunctiva are pink and non-injected, sclera clear OROPHARYNX:no exudate, and lips, buccal mucosa, and tongue normal NECK: supple, thyroid normal size, non-tender, without nodularity LYMPH:  no palpable lymphadenopathy in the cervical, axillary or supraclavicular LUNGS: clear to auscultation and percussion with normal breathing effort HEART: regular rate & rhythm and no murmurs and no lower extremity edema ABDOMEN:abdomen soft, non-tender and normal bowel sounds; enlarged spleen about 2 cm below costaphrenic angle. No additional stigmata of liver disease.  Musculoskeletal:no cyanosis of digits and no clubbing  NEURO: alert & oriented x 3 with fluent speech, no focal motor/sensory deficits  LABORATORY DATA: Labs:  CBC Latest Ref Rng & Units 11/14/2017 09/19/2017 07/25/2017  WBC 4.0 - 10.3 K/uL 4.3 4.9 6.5  Hemoglobin 13.0 - 17.1 g/dL 16.1 09.6 04.5  Hematocrit 38.4 - 49.9 % 39.9 42.2 43.5  Platelets 140 - 400 K/uL 116(L) 131(L) 136(L)    CMP Latest Ref Rng & Units 05/13/2014 01/28/2014 10/07/2013  Glucose 70 - 140 mg/dl 409(W) 119(J) 478(G)  BUN 7.0 - 26.0 mg/dL 95.6 21.3 08.6  Creatinine 0.7 - 1.3 mg/dL 0.8 0.9 0.9  Sodium 578 - 145 mEq/L 138 136 133(L)  Potassium 3.5 - 5.1 mEq/L 4.0 3.9 4.2  Chloride 96 - 112 mEq/L - - -  CO2 22 - 29 mEq/L 29 28 23   Calcium 8.4 - 10.4 mg/dL 9.1 8.8 8.4  Total Protein 6.4 - 8.3 g/dL 7.5 6.7 7.0  Total Bilirubin 0.20 - 1.20 mg/dL 4.69 6.29 5.28  Alkaline Phos 40 - 150 U/L 142 130 170(H)  AST 5 - 34 U/L 52(H) 32 131(H)  ALT 0 - 55 U/L 41 19 88(H)    Results for SHAWNEE, HIGHAM (MRN [id number]) as of 11/14/2017 10:40  Ref. Range 02/13/2017 12:24 04/24/2017 08:49 07/25/2017 08:51 09/19/2017 10:52  Iron Latest Ref Range: 42 - 163 ug/dL  272 (H) 536 (H) 67 644 (H)  UIBC Latest Units: ug/dL 034 742 595 53  TIBC Latest Ref Range: 202 - 409 ug/dL 638 756 433 (H) 295  %SAT Latest Ref Range: 20 - 55 % 61 (H) 58 (H)    Saturation Ratios Latest Ref Range: 42 - 163 %   16 (L) 86  Ferritin Latest Ref Range: 22 - 316 ng/mL 33 30 27 32     RADIOGRAPHIC STUDIES:  MR BRAIN WO CONTRAST 09/21/2013 IMPRESSION: Mildly abnormal MRI brain (without) demonstrating: 1. Scattered periventricular, subcortical and juxtacortical foci of non-specific T2 hyperintensities. These findings are non-specific and considerations include autoimmune, inflammatory, post-infectious, microvascular ischemic or migraine associated etiologies. 2. Slightly hyperintense T1 signal in the bilateral globus pallidi. This is non-specific but can be seen in association with liver disease and mineral deposition. 3. No acute findings  ASSESSMENT: John [name] 62 y.o. male with a history of Hepatitis C and Hemochromatosis.   PLAN:  1. Hereditary hemochromatosis.  --We discuss the goal is maintaining his ferritin less than 50 and transferrin saturation <50%).  -He has been compliant with lab and phlebotomy as needed every 2-3 months, but did miss a few appointments in 2018 -We previously discussed low iron diet. He has cut back on red meat intake, I encourage him to eat less red meat, and consider chicken, [place], seafood etc. as his main protein resource. -He had experienced 1 episode of syncope during phlebotomy. I strongly encouraged him to make sure he drinks plenty of fluid prior to procedure.  -Last ECHO in 2016 was normal. He has been following up with Dr. Donnal DebarBruggen for his liver  -I again recommended for future labs to be fasting labs as iron rich foods can effect his results. He is agreeable.  -He is serum iron level is normal, transferrin saturation 16%, ferritin 24, no need phlebotomy for now. -Continue to repeat labs every 2 months with phlebotomy as needed based  on iron levels.  -F/u in 8 months  2. Cytopenia  -His mild anemia is likely secondary to phlebotomy and iron deficiency, resolved now  -His thrombocytopenia is likely secondary to liver cirrhosis and splenomegaly, no signs of bleeding. Continue to watch closely. -PLT at 116K today (11/14/17)  4. DM2 secondary #1.  --Continue insulin sliding scale and follow-up with [facility].   5. 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  --He follows at with [facility] and finished treatment for HepC in 2015 --Continue nadolol. He was counseled to report any bleeding.  --His HCV level is non-detected (labs in Care Everywhere) with normalization of his transiminases.  -[facility] by ultrasound and lab every 6 months by Dr. Donnal DebarBruggen  6. History of CHF -I previously discussed his echo results from October 2016, which showed normal EF and normal structure.  7. Intermittent Vertigo  -I recommend he follow up with his Neurologist Dr. Terrace ArabiaYan about this soon, I provided him with her contact information    Follow-up.  -Lab every 2 months X4 in the morning, I will set up a phlebotomy if his ferritin more than 50, or transferrin saturation more than 50% -F/u in 8 months  -No need phlebotomy at this time   All questions were answered. The patient knows to call the clinic with any problems, questions or concerns. We can certainly see the patient much sooner if necessary.  I spent 15 minutes counseling the patient face to face. The total time spent in the appointment was 20 minutes.  Rogelia RohrerI, Amoya Bennett, am acting as scribe for Malachy MoodYan Carmelita Amparo, MD.   I have reviewed the above documentation for accuracy and completeness, and I agree with the above.    Malachy MoodYan Dany Harten 11/14/2017

## 2017-11-15 ENCOUNTER — Telehealth: Payer: Self-pay | Admitting: Hematology

## 2017-11-15 ENCOUNTER — Telehealth: Payer: Self-pay

## 2017-11-15 NOTE — Telephone Encounter (Signed)
-----   Message from Malachy MoodYan Feng, MD sent at 11/14/2017  5:15 PM EDT ----- Please let pt know that his iron level is good, no need phlebotomy for now.  Malachy MoodYan Feng  11/14/2017

## 2017-11-15 NOTE — Telephone Encounter (Signed)
Appointments scheduled Calendar/ letter mailed to patient per 6/6 los

## 2017-11-15 NOTE — Telephone Encounter (Signed)
Per Dr. Mosetta PuttFeng notified patient iron level is good and does not need phlebotomy today, patient verbalized an understanding.

## 2018-01-09 ENCOUNTER — Other Ambulatory Visit: Payer: Medicaid Other

## 2018-01-15 ENCOUNTER — Inpatient Hospital Stay: Payer: Medicaid Other | Attending: Hematology

## 2018-01-15 LAB — CBC WITH DIFFERENTIAL/PLATELET
BASOS ABS: 0 10*3/uL (ref 0.0–0.1)
Basophils Relative: 1 %
Eosinophils Absolute: 0.3 10*3/uL (ref 0.0–0.5)
Eosinophils Relative: 8 %
HEMATOCRIT: 43.5 % (ref 38.4–49.9)
Hemoglobin: 14.8 g/dL (ref 13.0–17.1)
LYMPHS ABS: 1.2 10*3/uL (ref 0.9–3.3)
LYMPHS PCT: 31 %
MCH: 30.5 pg (ref 27.2–33.4)
MCHC: 34 g/dL (ref 32.0–36.0)
MCV: 89.7 fL (ref 79.3–98.0)
MONO ABS: 0.4 10*3/uL (ref 0.1–0.9)
MONOS PCT: 10 %
NEUTROS ABS: 2 10*3/uL (ref 1.5–6.5)
Neutrophils Relative %: 50 %
Platelets: 103 10*3/uL — ABNORMAL LOW (ref 140–400)
RBC: 4.85 MIL/uL (ref 4.20–5.82)
RDW: 16.3 % — ABNORMAL HIGH (ref 11.0–14.6)
WBC: 4 10*3/uL (ref 4.0–10.3)

## 2018-01-15 LAB — IRON AND TIBC
IRON: 167 ug/dL — AB (ref 42–163)
Saturation Ratios: 43 % (ref 42–163)
TIBC: 391 ug/dL (ref 202–409)
UIBC: 224 ug/dL

## 2018-01-15 LAB — FERRITIN: FERRITIN: 28 ng/mL (ref 24–336)

## 2018-01-16 ENCOUNTER — Telehealth: Payer: Self-pay

## 2018-01-16 NOTE — Telephone Encounter (Signed)
-----   Message from Malachy MoodYan Feng, MD sent at 01/16/2018  8:21 AM EDT ----- Please let pt know the lab results, no need phlebotomy for now. His serum iron level was elevated, not sure if he was not fasting. I suggest him to be fasting for blood draw in the future, thanks  Malachy MoodYan Feng  01/16/2018

## 2018-01-16 NOTE — Telephone Encounter (Signed)
Spoke with patient per Dr. Mosetta PuttFeng with lab results, no need for phlebotomy for now.  Serum iron level was a little elevated.    In speaking with patient he was fasting for this blood draw.

## 2018-02-25 ENCOUNTER — Encounter

## 2018-02-25 ENCOUNTER — Ambulatory Visit: Payer: Medicaid Other | Admitting: Neurology

## 2018-02-25 ENCOUNTER — Encounter: Payer: Self-pay | Admitting: Neurology

## 2018-02-25 VITALS — BP 139/95 | HR 92 | Ht 72.0 in | Wt 173.0 lb

## 2018-02-25 DIAGNOSIS — R42 Dizziness and giddiness: Secondary | ICD-10-CM | POA: Insufficient documentation

## 2018-02-25 NOTE — Progress Notes (Signed)
PATIENT: John [name] DOB: [birth year]  Chief Complaint  Patient presents with  . Follow-up    Follow up experience a spinning feeling when lying down and when he gets  up, its been going on for 6 months  room 4 patient alone     HISTORICAL  John [name] is a 62 years old male, seen in request by his primary care physician for evaluation of vertigo, Initial evaluation was on February 25, 2018.  I have reviewed and summarized the referring note from the referring physician.  He had past medical history of hepatitis C, stage IV cirrhosis, has received treatment, diabetes, hyperlipidemia, hemochromatosis,  I saw him previously for motor and vocal tics,  In 2007, he presented with generalized weakness, glucose more than 1000, he was found to have hemochromatosis, stage IV hepatic cirrhosis based on biopsy, evaluation was done at [facility] was diagnosed with hepatitis C at the same time, was not sure the source of infection, he had tatto before 1980s. Around that time, he also developed episode of uncontrollable body jerking movement, making sound sometimes, it progressively worsened over the years, During interview, he had multiple episodes of uncontrollable sudden body flexion, making sound, no loss of consciousness, He was previously diagnosed with motor tics, was given prescription of clonazepam, but he rarely tries it, worry about the side effect, clonazepam would put him into sleep, also worry about the side effect of addiction, liver damage. [place] 05/2013 showed Hepatic cirrhosis. Multiple echogenic nodules within a heterogenous liver parenchyma. Cholelithiasis with tumefactive sludge. Persistent gallbladder wall thickening is likely related to underlying liver disease. He was previously evaluated by neurologists at [facility] for similar complains, was given prescription of gabapentin, which does help him some. He also complains of right hip pain, difficulty crossing his legs  because of hip pain, radiating pain to his right groin area  MRI of the brain which showed mild small vessel disease, no acute lesions, no significant basal ganglion signal changes. X-ray of right hip was essentially normal, He was seen by [facility] resident Neurology clinic in November 2013, was diagnosed with complex tic disorder,  He undergoes phlebotomy every 2 weeks since 05/2013; he is seeing an outside hematologist at [facility] in [place].He does not have hepatic encephalopathy or ascites. He takes lactulose prn for constipation. He is on nadolol for varices, but has had no bleeding events.He responded very well to Neurontin 300 mg 3 times a day, but complains of tongue swelling with certain generic form of gabapentin     He noticed subacute onset of vertigo since March 2019, has been persistent since then, if he turns his head suddenly to one direction, he would have sudden onset spinning sensation, dizziness, even when he lies down at bedtime, especially when he turned to the left side, transient, last for couple minutes.  He denies hearing loss, denies lateralized motor or sensory deficit.   REVIEW OF SYSTEMS: Full 14 system review of systems performed and notable only for shortness of breath, restless leg, frequent awakening, daytime sleepiness, frequent urination, aching muscles, walking difficulty, bruise easily, numbness, weakness, anxiety  ALLERGIES: Allergies  Allergen Reactions  . Sulfa Antibiotics Swelling    Of tongue  . Sulfamethoxazole Swelling  . Testosterone Rash and Dermatitis    HOME MEDICATIONS: Current Outpatient Medications  Medication Sig Dispense Refill  . gabapentin (NEURONTIN) 100 MG capsule Take 1 capsule (100 mg total) by mouth 2 (two) times daily. 60 capsule 5  .  gabapentin (NEURONTIN) 300 MG capsule TAKE ONE CAPSULE BY MOUTH AT BEDTIME 30 capsule 0  . glucagon 1 MG injection Inject 1 mg into the vein once as needed.    . insulin aspart (NOVOLOG) 100  UNIT/ML injection Inject 0-20 Units into the skin 3 (three) times daily before meals. Sliding scale    . insulin glargine (LANTUS) 100 UNIT/ML injection Inject 50 Units into the skin daily.     . Insulin Pen Needle (B-D ULTRAFINE III SHORT PEN) 31G X 8 MM MISC Injects 4 times daily    . propranolol (INDERAL) 40 MG tablet Take 40 mg by mouth 2 (two) times daily.      No current facility-administered medications for this visit.     PAST MEDICAL HISTORY: Past Medical History:  Diagnosis Date  . Anxiety   . Diabetes (HCC)   . Foot pain left   to have MRI tues.   . Hepatitis C   . High cholesterol     PAST SURGICAL HISTORY: Past Surgical History:  Procedure Laterality Date  . None      FAMILY HISTORY: History reviewed. No pertinent family history.  SOCIAL HISTORY: Social History   Socioeconomic History  . Marital status: Single    Spouse name: Not on file  . Number of children: 4  . Years of education: 30  . Highest education level: Not on file  Occupational History    Comment: disabled  Social Needs  . Financial resource strain: Not on file  . Food insecurity:    Worry: Not on file    Inability: Not on file  . Transportation needs:    Medical: Not on file    Non-medical: Not on file  Tobacco Use  . Smoking status: Never Smoker  . Smokeless tobacco: Never Used  Substance and Sexual Activity  . Alcohol use: No    Comment: Quit 2007  . Drug use: No    Comment: Quit 1980  . Sexual activity: Not on file  Lifestyle  . Physical activity:    Days per week: Not on file    Minutes per session: Not on file  . Stress: Not on file  Relationships  . Social connections:    Talks on phone: Not on file    Gets together: Not on file    Attends religious service: Not on file    Active member of club or organization: Not on file    Attends meetings of clubs or organizations: Not on file    Relationship status: Not on file  . Intimate partner violence:    Fear of current  or ex partner: Not on file    Emotionally abused: Not on file    Physically abused: Not on file    Forced sexual activity: Not on file  Other Topics Concern  . Not on file  Social History Narrative   Patient lives at home with his girlfriend.    Patient is disabled.   Right handed.   Caffeine- 1-2 cups of caffeine daily.   Education- two years of college.           PHYSICAL EXAM   Vitals:   02/25/18 0858  BP: (!) 139/95  Pulse: 92  Weight: 173 lb (78.5 kg)  Height: 6' (1.829 m)    Not recorded      Body mass index is 23.46 kg/m.  PHYSICAL EXAMNIATION:  Gen: NAD, conversant, well nourised, obese, well groomed  Cardiovascular: Regular rate rhythm, no peripheral edema, warm, nontender. Eyes: Conjunctivae clear without exudates or hemorrhage Neck: Supple, no carotid bruits. Pulmonary: Clear to auscultation bilaterally   NEUROLOGICAL EXAM:  MENTAL STATUS: Speech:    Speech is normal; fluent and spontaneous with normal comprehension.  Cognition:     Orientation to time, place and person     Normal recent and remote memory     Normal Attention span and concentration     Normal Language, naming, repeating,spontaneous speech     Fund of knowledge   CRANIAL NERVES: CN II: Visual fields are full to confrontation.  Pupils are round equal and briskly reactive to light. CN III, IV, VI: extraocular movement are normal. No ptosis. CN V: Facial sensation is intact to pinprick in all 3 divisions bilaterally. Corneal responses are intact.  CN VII: Face is symmetric with normal eye closure and smile. CN VIII: Hearing is normal to rubbing fingers CN IX, X: Palate elevates symmetrically. Phonation is normal. CN XI: Head turning and shoulder shrug are intact CN XII: Tongue is midline with normal movements and no atrophy.  MOTOR: There is no pronator drift of out-stretched arms. Muscle bulk and tone are normal. Muscle strength is  normal.  REFLEXES: Reflexes are 2+ and symmetric at the biceps, triceps, knees, and ankles. Plantar responses are flexor.  SENSORY: Intact to light touch, pinprick, positional sensation and vibratory sensation are intact in fingers and toes.  COORDINATION: Rapid alternating movements and fine finger movements are intact. There is no dysmetria on finger-to-nose and heel-knee-shin.    GAIT/STANCE: Posture is normal.  Mild difficulty with tandem walking  Apley's maneuver: Left ear dependent position, after short latency, he develop rotatory nystagmus, with intense vertigo, habituate quickly, much improved in the second attempt   DIAGNOSTIC DATA (LABS, IMAGING, TESTING) - I reviewed patient records, labs, notes, testing and imaging myself where available.   ASSESSMENT AND PLAN  Demetrius Revellvin R Zick is a 62 y.o. male    Acute onset of vertigo  Most consistent with benign positional vertigo,  Which is also confirmed and helped by today's Epley's maneuver  Continue vestibular rehab at home   Return to clinic in 2 months,   Levert FeinsteinYijun Carmeline Kowal, M.D. Ph.D.  [facility] [address], [address] [place], [place] [address] Ph: [phone]) [phone] Fax: [phone]  CC: Referring Provider

## 2018-02-25 NOTE — Patient Instructions (Signed)

## 2018-02-26 LAB — RPR: RPR Ser Ql: NONREACTIVE

## 2018-02-26 LAB — VITAMIN B12: VITAMIN B 12: 1175 pg/mL (ref 232–1245)

## 2018-02-26 LAB — TSH: TSH: 2.19 u[IU]/mL (ref 0.450–4.500)

## 2018-03-06 ENCOUNTER — Ambulatory Visit: Payer: Medicaid Other

## 2018-03-06 ENCOUNTER — Other Ambulatory Visit: Payer: Medicaid Other

## 2018-03-17 ENCOUNTER — Inpatient Hospital Stay: Payer: Medicaid Other | Attending: Hematology

## 2018-03-17 DIAGNOSIS — D696 Thrombocytopenia, unspecified: Secondary | ICD-10-CM | POA: Insufficient documentation

## 2018-03-17 LAB — CBC WITH DIFFERENTIAL/PLATELET
BASOS ABS: 0 10*3/uL (ref 0.0–0.1)
Basophils Relative: 0 %
Eosinophils Absolute: 0.3 10*3/uL (ref 0.0–0.5)
Eosinophils Relative: 7 %
HEMATOCRIT: 42.6 % (ref 38.4–49.9)
Hemoglobin: 14.4 g/dL (ref 13.0–17.1)
Lymphocytes Relative: 28 %
Lymphs Abs: 1 10*3/uL (ref 0.9–3.3)
MCH: 31.9 pg (ref 27.2–33.4)
MCHC: 33.8 g/dL (ref 32.0–36.0)
MCV: 94.5 fL (ref 79.3–98.0)
MONO ABS: 0.4 10*3/uL (ref 0.1–0.9)
Monocytes Relative: 10 %
NEUTROS ABS: 2 10*3/uL (ref 1.5–6.5)
Neutrophils Relative %: 55 %
Platelets: 106 10*3/uL — ABNORMAL LOW (ref 140–400)
RBC: 4.5 MIL/uL (ref 4.20–5.82)
RDW: 15.1 % — AB (ref 11.0–14.6)
WBC: 3.7 10*3/uL — ABNORMAL LOW (ref 4.0–10.3)

## 2018-03-17 LAB — IRON AND TIBC
Iron: 320 ug/dL — ABNORMAL HIGH (ref 42–163)
Saturation Ratios: 94 % (ref 42–163)
TIBC: 340 ug/dL (ref 202–409)
UIBC: 20 ug/dL

## 2018-03-17 LAB — FERRITIN: Ferritin: 61 ng/mL (ref 24–336)

## 2018-03-19 ENCOUNTER — Telehealth: Payer: Self-pay | Admitting: Hematology

## 2018-03-19 ENCOUNTER — Telehealth: Payer: Self-pay

## 2018-03-19 NOTE — Telephone Encounter (Signed)
Left voice message for patient per Dr. Mosetta Putt based on lab results needs phlebotomy.  Scheduling will call him with an appointment.

## 2018-03-19 NOTE — Telephone Encounter (Signed)
Called regarding 10/16 °

## 2018-03-19 NOTE — Telephone Encounter (Signed)
-----   Message from Malachy Mood, MD sent at 03/19/2018  6:55 AM EDT ----- Please let pt know his lab result, I will send a schedule message for phlebotomy next week, thanks   Malachy Mood  03/19/2018

## 2018-03-26 ENCOUNTER — Inpatient Hospital Stay: Payer: Medicaid Other

## 2018-03-26 NOTE — Progress Notes (Signed)
Phlebotomy preformed via left AC using phleb kit.  500gm removed over approximately 5 min.  Pt tolerated well.  Observed post procedure.  Drink provided.

## 2018-03-26 NOTE — Patient Instructions (Signed)

## 2018-04-28 ENCOUNTER — Encounter: Payer: Self-pay | Admitting: Neurology

## 2018-04-28 ENCOUNTER — Ambulatory Visit: Payer: Medicaid Other | Admitting: Neurology

## 2018-04-28 VITALS — BP 110/62 | HR 64 | Ht 72.0 in | Wt 172.0 lb

## 2018-04-28 DIAGNOSIS — R42 Dizziness and giddiness: Secondary | ICD-10-CM

## 2018-04-28 DIAGNOSIS — F951 Chronic motor or vocal tic disorder: Secondary | ICD-10-CM | POA: Diagnosis not present

## 2018-04-28 MED ORDER — GABAPENTIN 300 MG PO CAPS: 300.0000 mg | ORAL_CAPSULE | Freq: Every day | ORAL | 4 refills | Status: DC

## 2018-04-28 MED ORDER — GABAPENTIN 100 MG PO CAPS: 100.0000 mg | ORAL_CAPSULE | Freq: Two times a day (BID) | ORAL | 11 refills | Status: DC

## 2018-04-28 NOTE — Progress Notes (Signed)
PATIENT: John [name] DOB: 08/05/1955  Chief Complaint  Patient presents with  . Dizziness    Reports that his dizziness has resolved.  . Motor Tics    He is doing well on gabapentin.     HISTORICAL  John [name] is a 62 years old male, seen in request by his primary care physician for evaluation of vertigo, Initial evaluation was on February 25, 2018.  I have reviewed and summarized the referring note from the referring physician.  He had past medical history of hepatitis C, stage IV cirrhosis, has received treatment, diabetes, hyperlipidemia, hemochromatosis,  I saw him previously for motor and vocal tics,  In 2007, he presented with generalized weakness, glucose more than 1000, he was found to have hemochromatosis, stage IV hepatic cirrhosis based on biopsy, evaluation was done at [facility] was diagnosed with hepatitis C at the same time, was not sure the source of infection, he had tatto before 1980s. Around that time, he also developed episode of uncontrollable body jerking movement, making sound sometimes, it progressively worsened over the years, During interview, he had multiple episodes of uncontrollable sudden body flexion, making sound, no loss of consciousness, He was previously diagnosed with motor tics, was given prescription of clonazepam, but he rarely tries it, worry about the side effect, clonazepam would put him into sleep, also worry about the side effect of addiction, liver damage. [place] 05/2013 showed Hepatic cirrhosis. Multiple echogenic nodules within a heterogenous liver parenchyma. Cholelithiasis with tumefactive sludge. Persistent gallbladder wall thickening is likely related to underlying liver disease. He was previously evaluated by neurologists at [facility] for similar complains, was given prescription of gabapentin, which does help him some. He also complains of right hip pain, difficulty crossing his legs because of hip pain, radiating pain to his  right groin area  MRI of the brain which showed mild small vessel disease, no acute lesions, no significant basal ganglion signal changes. X-ray of right hip was essentially normal, He was seen by [facility] resident Neurology clinic in November 2013, was diagnosed with complex tic disorder,  He undergoes phlebotomy every 2 weeks since 05/2013; he is seeing an outside hematologist at [facility] in [place].He does not have hepatic encephalopathy or ascites. He takes lactulose prn for constipation. He is on nadolol for varices, but has had no bleeding events.He responded very well to Neurontin 300 mg 3 times a day, but complains of tongue swelling with certain generic form of gabapentin     He noticed subacute onset of vertigo since March 2019, has been persistent since then, if he turns his head suddenly to one direction, he would have sudden onset spinning sensation, dizziness, even when he lies down at bedtime, especially when he turned to the left side, transient, last for couple minutes.  He denies hearing loss, denies lateralized motor or sensory deficit.  UPDATE Apr 28 2018: He responded very well to repositioning maneuver, no longer has vertigo, he continue has motor tics, especially at nighttime, he is tired, anxious, gabapentin has been helpful, he is taking gabapentin 100 mg twice a day, and 300 mg at nighttime,  REVIEW OF SYSTEMS: Full 14 system review of systems performed and notable only for as above.  All rest review of systems were negative.  ALLERGIES: Allergies  Allergen Reactions  . Sulfa Antibiotics Swelling    Of tongue  . Sulfamethoxazole Swelling  . Testosterone Rash and Dermatitis    HOME MEDICATIONS: Current Outpatient Medications  Medication  Sig Dispense Refill  . gabapentin (NEURONTIN) 100 MG capsule Take 1 capsule (100 mg total) by mouth 2 (two) times daily. 60 capsule 5  . gabapentin (NEURONTIN) 300 MG capsule TAKE ONE CAPSULE BY MOUTH AT BEDTIME 30 capsule 0  .  glucagon 1 MG injection Inject 1 mg into the vein once as needed.    . insulin aspart (NOVOLOG) 100 UNIT/ML injection Inject 0-20 Units into the skin 3 (three) times daily before meals. Sliding scale    . insulin glargine (LANTUS) 100 UNIT/ML injection Inject 50 Units into the skin daily.     . Insulin Pen Needle (B-D ULTRAFINE III SHORT PEN) 31G X 8 MM MISC Injects 4 times daily    . propranolol (INDERAL) 40 MG tablet Take 40 mg by mouth 2 (two) times daily.      No current facility-administered medications for this visit.     PAST MEDICAL HISTORY: Past Medical History:  Diagnosis Date  . Anxiety   . Diabetes (HCC)   . Foot pain left   to have MRI tues.   . Hepatitis C   . High cholesterol     PAST SURGICAL HISTORY: Past Surgical History:  Procedure Laterality Date  . None      FAMILY HISTORY: History reviewed. No pertinent family history.  SOCIAL HISTORY: Social History   Socioeconomic History  . Marital status: Single    Spouse name: Not on file  . Number of children: 4  . Years of education: 69  . Highest education level: Not on file  Occupational History    Comment: disabled  Social Needs  . Financial resource strain: Not on file  . Food insecurity:    Worry: Not on file    Inability: Not on file  . Transportation needs:    Medical: Not on file    Non-medical: Not on file  Tobacco Use  . Smoking status: Never Smoker  . Smokeless tobacco: Never Used  Substance and Sexual Activity  . Alcohol use: No    Comment: Quit 2007  . Drug use: No    Comment: Quit 1980  . Sexual activity: Not on file  Lifestyle  . Physical activity:    Days per week: Not on file    Minutes per session: Not on file  . Stress: Not on file  Relationships  . Social connections:    Talks on phone: Not on file    Gets together: Not on file    Attends religious service: Not on file    Active member of club or organization: Not on file    Attends meetings of clubs or organizations:  Not on file    Relationship status: Not on file  . Intimate partner violence:    Fear of current or ex partner: Not on file    Emotionally abused: Not on file    Physically abused: Not on file    Forced sexual activity: Not on file  Other Topics Concern  . Not on file  Social History Narrative   Patient lives at home with his girlfriend.    Patient is disabled.   Right handed.   Caffeine- 1-2 cups of caffeine daily.   Education- two years of college.           PHYSICAL EXAM   Vitals:   04/28/18 1348  BP: 110/62  Pulse: 64  Weight: 172 lb (78 kg)  Height: 6' (1.829 m)    Not recorded  Body mass index is 23.33 kg/m.  PHYSICAL EXAMNIATION:  Gen: NAD, conversant, well nourised, obese, well groomed                     Cardiovascular: Regular rate rhythm, no peripheral edema, warm, nontender. Eyes: Conjunctivae clear without exudates or hemorrhage Neck: Supple, no carotid bruits. Pulmonary: Clear to auscultation bilaterally   NEUROLOGICAL EXAM:  MENTAL STATUS: Speech:    Speech is normal; fluent and spontaneous with normal comprehension.  Cognition:     Orientation to time, place and person     Normal recent and remote memory     Normal Attention span and concentration     Normal Language, naming, repeating,spontaneous speech     Fund of knowledge   CRANIAL NERVES: CN II: Visual fields are full to confrontation.  Pupils are round equal and briskly reactive to light. CN III, IV, VI: extraocular movement are normal. No ptosis. CN V: Facial sensation is intact to pinprick in all 3 divisions bilaterally. Corneal responses are intact.  CN VII: Face is symmetric with normal eye closure and smile. CN VIII: Hearing is normal to rubbing fingers CN IX, X: Palate elevates symmetrically. Phonation is normal. CN XI: Head turning and shoulder shrug are intact CN XII: Tongue is midline with normal movements and no atrophy.  MOTOR: There is no pronator drift of  out-stretched arms. Muscle bulk and tone are normal. Muscle strength is normal.  REFLEXES: Reflexes are 2+ and symmetric at the biceps, triceps, knees, and ankles. Plantar responses are flexor.  SENSORY: Intact to light touch, pinprick, positional sensation and vibratory sensation are intact in fingers and toes.  COORDINATION: Rapid alternating movements and fine finger movements are intact. There is no dysmetria on finger-to-nose and heel-knee-shin.    GAIT/STANCE: Posture is normal.  Mild difficulty with tandem walking  Apley's maneuver: Left ear dependent position, after short latency, he develop rotatory nystagmus, with intense vertigo, habituate quickly, much improved in the second attempt   DIAGNOSTIC DATA (LABS, IMAGING, TESTING) - I reviewed patient records, labs, notes, testing and imaging myself where available.   ASSESSMENT AND PLAN  John [name] is a 62 y.o. male    Acute onset of vertigo  Most consistent with benign positional vertigo,  Which is also confirmed and helped by Epley's maneuver  Motor tics  Increase daytime gabapentin to 100 mg 3 times a day, gabapentin 300 mg before bedtime new prescription was written  Levert FeinsteinYijun Santiaga Butzin, M.D. Ph.D.  [facility] [address], [address] [place], [place] [address] Ph: [phone] Fax: [phone]  CC: Referring Provider

## 2018-05-16 ENCOUNTER — Inpatient Hospital Stay: Payer: Medicaid Other | Attending: Hematology

## 2018-07-02 ENCOUNTER — Encounter: Payer: Self-pay | Admitting: Podiatrist

## 2018-07-02 ENCOUNTER — Other Ambulatory Visit: Payer: Self-pay

## 2018-07-02 ENCOUNTER — Ambulatory Visit (INDEPENDENT_AMBULATORY_CARE_PROVIDER_SITE_OTHER): Payer: Medicaid Other | Admitting: Podiatrist

## 2018-07-02 VITALS — BP 138/96 | HR 69

## 2018-07-02 DIAGNOSIS — M216X2 Other acquired deformities of left foot: Secondary | ICD-10-CM

## 2018-07-02 DIAGNOSIS — E119 Type 2 diabetes mellitus without complications: Secondary | ICD-10-CM

## 2018-07-02 DIAGNOSIS — M79676 Pain in unspecified toe(s): Secondary | ICD-10-CM

## 2018-07-02 DIAGNOSIS — L84 Corns and callosities: Secondary | ICD-10-CM

## 2018-07-02 DIAGNOSIS — B351 Tinea unguium: Secondary | ICD-10-CM

## 2018-07-02 DIAGNOSIS — B353 Tinea pedis: Secondary | ICD-10-CM

## 2018-07-02 NOTE — Patient Instructions (Signed)
Athlete's Foot    Athlete's foot (tinea pedis) is a fungal infection of the skin on your feet. It often occurs on the skin that is between or underneath your toes. It can also occur on the soles of your feet. Symptoms include itchy or white and flaky areas on the skin. The infection can spread from person to person (is contagious). It can also spread when a person's bare feet come in contact with the fungus on shower floors or on items such as shoes.  Follow these instructions at home:  Medicines  · Apply or take over-the-counter and prescription medicines only as told by your doctor.  · Apply your antifungal medicine as told by your doctor. Do not stop using the medicine even if your feet start to get better.  Foot care  · Do not scratch your feet.  · Keep your feet dry:  ? Wear cotton or wool socks. Change your socks every day or if they become wet.  ? Wear shoes that allow air to move around, such as sandals or canvas tennis shoes.  · Wash and dry your feet:  ? Every day or as told by your doctor.  ? After exercising.  ? Including the area between your toes.  General instructions  · Do not share any of these items that touch your feet:  ? Towels.  ? Shoes.  ? Nail clippers.  ? Other personal items.  · Protect your feet by wearing sandals in wet areas, such as locker rooms and shared showers.  · Keep all follow-up visits as told by your doctor. This is important.  · If you have diabetes, keep your blood sugar under control.  Contact a doctor if:  · You have a fever.  · You have swelling, pain, warmth, or redness in your foot.  · Your feet are not getting better with treatment.  · Your symptoms get worse.  · You have new symptoms.  Summary  · Athlete's foot is a fungal infection of the skin on your feet.  · Symptoms include itchy or white and flaky areas on the skin.  · Apply your antifungal medicine as told by your doctor.  · Keep your feet clean and dry.  This information is not intended to replace advice given  to you by your health care provider. Make sure you discuss any questions you have with your health care provider.  Document Released: 11/14/2007 Document Revised: 03/18/2017 Document Reviewed: 03/18/2017  Elsevier Interactive Patient Education © 2019 [employer] Inc[employer]

## 2018-07-05 MED ORDER — KETOCONAZOLE 2 % EX CREA: 1.0000 "application " | TOPICAL_CREAM | Freq: Every day | CUTANEOUS | 2 refills | Status: AC

## 2018-07-05 NOTE — Progress Notes (Signed)
  Subjective:     Patient ID: John [name], male   DOB: 1955-06-19, 63 y.o.   MRN: [id number]  HPI patient presents today for diabetic foot examination and nail trim.  He also has callus on the left fifth metatarsal head that is bothersome. Points to a webspace infection on the left fourth interspace that is bothersome as well.  Of note he did experience a drop in blood sugar at today's visit.  He had dental surgery yesterday and his eating is off which caused his blood sugar to drop this morning.  During the visit it was elevated with glucose tabs to 110.       Objective:   Physical Exam   vascular exam is normal with palpable DP and PT pulses at 2/4 bilateral.  Capillary refill time is immediate. Neurological sensation intact 5 out of 5 sites via [facility] monofilament 5.07.  Light touch intact. Musculoskeletal examination prominent plantarflexed fifth metatarsal heads seen.  Mild digital contracture noted. Dermatological examination hyperkeratotic lesion submetatarsal 5 left noted.  Interdigital maceration fourth interspace left also noted.  Patient's toenails are elongated, thickened, dystrophic and painful with debridement.     Assessment:     Prominent and plantarflexed fifth metatarsal left.,  Interdigital maceration left.,  Diabetes with onychomycosis and nail disease x10.    Plan:     Callus debrided left submetatarsal 5 submetatarsal 5, nails debrided manually and mechanically,, nails abraded manually and mechanically, will call in a prescription for ketoconazole and he will be seen back in 3 months for continued care.

## 2018-07-15 NOTE — Progress Notes (Signed)
[facility]   Telephone:([phone] Fax:(336) [phone]   Clinic Follow up Note   Patient Care Team: Elizabeth Palau, FNP as PCP - General (Nurse Practitioner) Angelyn Punt, MD as Referring Physician (Internal Medicine) 07/17/2018  CHIEF COMPLAINT: F/u hemochromatosis   CURRENT THERAPY  Phlebotomy to maintain a ferritin less than 50 or transferrin saturation about 50% , and hold phlebotomy if hemoglobin is less than 12. Last phlebotomy on 03/26/2018.  INTERVAL HISTORY: John [name] is a 63 y.o. male who is here for follow-up. Since his last visit he has f/u with Dr. Terrace Arabia for his vertigo.  Today, he is here alone. He recently had some dental issues. He finished removing all his teeth about 10 days ago and will be getting dentures soon. After his teeth extraction he experienced a lot of bleeding.He really enjoys his visits with Dr. Terrace Arabia and denies recent episodes of vertigo. He is scheduled to have an ultrasound for abdomen on Monday and he will see Dr. Dareen Piano after. He denies lower edema, or SOB. He has noticed a some weight gain.    Pertinent positives and negatives of review of systems are listed and detailed within the above HPI.  REVIEW OF SYSTEMS:   Constitutional: Denies fevers, chills, (+) small weight gain  Eyes: Denies blurriness of vision Ears, nose, mouth, throat, and face: Denies mucositis or sore throat Respiratory: Denies cough, dyspnea or wheezes Cardiovascular: Denies palpitation, chest discomfort or lower extremity swelling Gastrointestinal:  Denies nausea, heartburn or change in bowel habits Skin: Denies abnormal skin rashes Lymphatics: Denies new lymphadenopathy or easy bruising Neurological:Denies numbness, tingling or new weaknesses Behavioral/Psych: Mood is stable, no new changes  All other systems were reviewed with the patient and are negative.  MEDICAL HISTORY:  Past Medical History:  Diagnosis Date  . Anxiety   . Diabetes (HCC)     . Foot pain left   to have MRI tues.   . Hepatitis C   . High cholesterol     SURGICAL HISTORY: Past Surgical History:  Procedure Laterality Date  . None      I have reviewed the social history and family history with the patient and they are unchanged from previous note.  ALLERGIES:  is allergic to sulfa antibiotics; sulfamethoxazole; and testosterone.  MEDICATIONS:  Current Outpatient Medications  Medication Sig Dispense Refill  . gabapentin (NEURONTIN) 100 MG capsule Take 1 capsule (100 mg total) by mouth 2 (two) times daily. 90 capsule 11  . gabapentin (NEURONTIN) 300 MG capsule Take 1 capsule (300 mg total) by mouth at bedtime. 90 capsule 4  . glucagon 1 MG injection Inject 1 mg into the vein once as needed.    . insulin aspart (NOVOLOG) 100 UNIT/ML injection Inject 0-20 Units into the skin 3 (three) times daily before meals. Sliding scale    . insulin glargine (LANTUS) 100 UNIT/ML injection Inject 50 Units into the skin daily.     . Insulin Pen Needle (B-D ULTRAFINE III SHORT PEN) 31G X 8 MM MISC Injects 4 times daily    . ketoconazole (NIZORAL) 2 % cream Apply 1 application topically daily. 60 g 2  . propranolol (INDERAL) 40 MG tablet Take 40 mg by mouth 2 (two) times daily.      No current facility-administered medications for this visit.     PHYSICAL EXAMINATION: ECOG PERFORMANCE STATUS: 1 - Symptomatic but completely ambulatory  Vitals:   07/17/18 1042  BP: 107/68  Pulse: 68  Resp: 18  Temp:  98.2 F (36.8 C)  SpO2: 97%   Filed Weights   07/17/18 1042  Weight: 183 lb 3.2 oz (83.1 kg)    GENERAL:alert, no distress and comfortable SKIN: skin color, texture, turgor are normal, no rashes or significant lesions EYES: normal, Conjunctiva are pink and non-injected, sclera clear OROPHARYNX:no exudate, no erythema and lips, buccal mucosa, and tongue normal  NECK: supple, thyroid normal size, non-tender, without nodularity LYMPH:  no palpable lymphadenopathy in  the cervical, axillary or inguinal LUNGS: clear to auscultation and percussion with normal breathing effort HEART: regular rate & rhythm and no murmurs and no lower extremity edema ABDOMEN:abdomen soft, non-tender and normal bowel sounds, organomegaly, no ascitics  Musculoskeletal:no cyanosis of digits and no clubbing NEURO: alert & oriented x 3 with fluent speech, no focal motor/sensory deficits  LABORATORY DATA:  I have reviewed the data as listed CBC Latest Ref Rng & Units 07/17/2018 03/17/2018 01/15/2018  WBC 4.0 - 10.5 K/uL 6.1 3.7(L) 4.0  Hemoglobin 13.0 - 17.0 g/dL 98.114.5 19.114.4 47.814.8  Hematocrit 39.0 - 52.0 % 44.1 42.6 43.5  Platelets 150 - 400 K/uL 141(L) 106(L) 103(L)     CMP Latest Ref Rng & Units 05/13/2014 01/28/2014 10/07/2013  Glucose 70 - 140 mg/dl 295(A177(H) 213(Y215(H) 865(H356(H)  BUN 7.0 - 26.0 mg/dL 84.610.8 96.213.7 95.211.9  Creatinine 0.7 - 1.3 mg/dL 0.8 0.9 0.9  Sodium 841136 - 145 mEq/L 138 136 133(L)  Potassium 3.5 - 5.1 mEq/L 4.0 3.9 4.2  Chloride 96 - 112 mEq/L - - -  CO2 22 - 29 mEq/L 29 28 23   Calcium 8.4 - 10.4 mg/dL 9.1 8.8 8.4  Total Protein 6.4 - 8.3 g/dL 7.5 6.7 7.0  Total Bilirubin 0.20 - 1.20 mg/dL 3.240.41 4.011.02 0.270.48  Alkaline Phos 40 - 150 U/L 142 130 170(H)  AST 5 - 34 U/L 52(H) 32 131(H)  ALT 0 - 55 U/L 41 19 88(H)      RADIOGRAPHIC STUDIES: I have personally reviewed the radiological images as listed and agreed with the findings in the report. No results found.   ASSESSMENT & PLAN:  John [name] is a 63 y.o. male with history of  1. Hereditary hemochromatosis.  -He has been compliant with lab and phlebotomy as needed every 2-3 months, but did miss a few appointments in 2018 -We previously discussed low iron diet. -He had experienced 1 episode of syncope during phlebotomy, but overall tolerating well  -Last ECHO in 2016 was normal. He has been following up with Dr. Donnal DebarBruggen for his liver cirrhosis  -He is clinically stable, lab reviewed, hemoglobin 14.5 today, iron studies  still pending, will call him if he needs phlebotomy. -He only required phlebotomy 3 times in 2019, will schedule his lab every 3 months and call him if he needs phlebotomy -Follow-up in 9 months  2. Cytopenia  -His thrombocytopenia is likely secondary to liver cirrhosis and splenomegaly, no signs of bleeding. Continue to watch closely.   3. DM2 secondary #1.  -Continue insulin sliding scale - on ketoconazole -follow-up with [facility]  5. 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  --Continue nadolol. He was counseled to report any bleeding.  --His HCV level is non-detected (labs in Care Everywhere) with normalization of his transiminases.  -[facility] and lab every 6 months by Dr. Donnal DebarBruggen -He is scheduled for abdominal ultrasound and to follow-up with Dr. Donnal DebarBruggen next week  6. History of CHF -I previously  discussed his echo results from October 2016, which showed normal EF and normal structure. -He is asymptomatic, clinically no signs of CHF.  7. Intermittent Vertigo  -Resolved after Apley's maneuver.  He is also on Neurontin -F/u with Neurologist Dr. Terrace Arabia   8. Dental issue -He has had all his teeth pulled lately, he is waiting for dentures  Plan  - I will call him with his lab results from today, and set up phlebotomy if ferritin less than 50 or transferrin saturation about 50% - Lab every 3 months, I suggest him to do fasting lab  - F/u in 9 months  No problem-specific Assessment & Plan notes found for this encounter.   No orders of the defined types were placed in this encounter.  All questions were answered. The patient knows to call the clinic with any problems, questions or concerns. No barriers to learning was detected. I spent 15 minutes counseling the patient face to face. The total time spent in the appointment was 20 minutes and more than 50% was on counseling and review  of test results  I, Rozelle Logan am acting as scribe for Dr. Malachy Mood.  I have reviewed the above documentation for accuracy and completeness, and I agree with the above.     Malachy Mood, MD 07/17/2018

## 2018-07-17 ENCOUNTER — Inpatient Hospital Stay (HOSPITAL_BASED_OUTPATIENT_CLINIC_OR_DEPARTMENT_OTHER): Payer: Medicaid Other | Admitting: Hematology

## 2018-07-17 ENCOUNTER — Inpatient Hospital Stay: Payer: Medicaid Other | Attending: Hematology

## 2018-07-17 ENCOUNTER — Encounter: Payer: Self-pay | Admitting: Hematology

## 2018-07-17 DIAGNOSIS — E7800 Pure hypercholesterolemia, unspecified: Secondary | ICD-10-CM | POA: Diagnosis not present

## 2018-07-17 DIAGNOSIS — F419 Anxiety disorder, unspecified: Secondary | ICD-10-CM | POA: Diagnosis not present

## 2018-07-17 DIAGNOSIS — K7469 Other cirrhosis of liver: Secondary | ICD-10-CM

## 2018-07-17 DIAGNOSIS — R42 Dizziness and giddiness: Secondary | ICD-10-CM | POA: Insufficient documentation

## 2018-07-17 DIAGNOSIS — B182 Chronic viral hepatitis C: Secondary | ICD-10-CM

## 2018-07-17 DIAGNOSIS — Z794 Long term (current) use of insulin: Secondary | ICD-10-CM | POA: Diagnosis not present

## 2018-07-17 DIAGNOSIS — E119 Type 2 diabetes mellitus without complications: Secondary | ICD-10-CM

## 2018-07-17 DIAGNOSIS — D696 Thrombocytopenia, unspecified: Secondary | ICD-10-CM

## 2018-07-17 DIAGNOSIS — Z79899 Other long term (current) drug therapy: Secondary | ICD-10-CM | POA: Insufficient documentation

## 2018-07-17 DIAGNOSIS — B1920 Unspecified viral hepatitis C without hepatic coma: Secondary | ICD-10-CM | POA: Insufficient documentation

## 2018-07-17 LAB — CBC WITH DIFFERENTIAL/PLATELET
ABS IMMATURE GRANULOCYTES: 0.03 10*3/uL (ref 0.00–0.07)
Basophils Absolute: 0 10*3/uL (ref 0.0–0.1)
Basophils Relative: 1 %
Eosinophils Absolute: 0.5 10*3/uL (ref 0.0–0.5)
Eosinophils Relative: 7 %
HCT: 44.1 % (ref 39.0–52.0)
HEMOGLOBIN: 14.5 g/dL (ref 13.0–17.0)
IMMATURE GRANULOCYTES: 1 %
LYMPHS PCT: 32 %
Lymphs Abs: 2 10*3/uL (ref 0.7–4.0)
MCH: 31.7 pg (ref 26.0–34.0)
MCHC: 32.9 g/dL (ref 30.0–36.0)
MCV: 96.3 fL (ref 80.0–100.0)
MONO ABS: 0.7 10*3/uL (ref 0.1–1.0)
MONOS PCT: 12 %
NEUTROS ABS: 2.9 10*3/uL (ref 1.7–7.7)
NEUTROS PCT: 47 %
PLATELETS: 141 10*3/uL — AB (ref 150–400)
RBC: 4.58 MIL/uL (ref 4.22–5.81)
RDW: 13.7 % (ref 11.5–15.5)
WBC: 6.1 10*3/uL (ref 4.0–10.5)
nRBC: 0 % (ref 0.0–0.2)

## 2018-07-17 LAB — IRON AND TIBC
Iron: 287 ug/dL — ABNORMAL HIGH (ref 42–163)
SATURATION RATIOS: 82 % — AB (ref 20–55)
TIBC: 351 ug/dL (ref 202–409)
UIBC: 64 ug/dL — AB (ref 117–376)

## 2018-07-17 LAB — FERRITIN: FERRITIN: 61 ng/mL (ref 24–336)

## 2018-07-18 ENCOUNTER — Telehealth: Payer: Self-pay | Admitting: Hematology

## 2018-07-18 NOTE — Telephone Encounter (Signed)
Scheduled appt per 2/6 sch message - left message with appt date and time.  

## 2018-07-18 NOTE — Telephone Encounter (Signed)
Called patient and scheduled appts per 2/6 los.  Patient aware of appt date and time.

## 2018-07-23 ENCOUNTER — Inpatient Hospital Stay: Payer: Medicaid Other

## 2018-07-24 ENCOUNTER — Inpatient Hospital Stay: Payer: Medicaid Other

## 2018-07-24 NOTE — Progress Notes (Signed)
Per Dr. Mosetta Putt proceed with phlebotomy based on lab results from 07/17/2018. Spoke with Dorene Grebe RN in Infusion.

## 2018-07-24 NOTE — Progress Notes (Signed)
3716- Therapeutic phlebotomy attempted via right AC. Line clotted off within one minute. Second attempt via left forearm with 20 gauge IV catheter unsuccessful. Patient requested to reschedule again in one week. Dr. Latanya Maudlin nurse Elnita Maxwell made aware.

## 2018-07-25 ENCOUNTER — Telehealth: Payer: Self-pay | Admitting: Hematology

## 2018-07-25 NOTE — Telephone Encounter (Signed)
Scheduled appt per 2/13 sch message -pt is aware of appt on 2/21

## 2018-08-01 ENCOUNTER — Inpatient Hospital Stay: Payer: Medicaid Other

## 2018-09-26 ENCOUNTER — Telehealth: Payer: Self-pay

## 2018-09-26 NOTE — Telephone Encounter (Signed)
TC from Pt. With concerns about coming for lab appointment on 10/16/18 Pt. Stated he was concerned that the staff at the [facility] are not wearing mask assured Pt that its mandatory for all staff to wear a mask and every patient is screened before arrival to the Cancer Center and screening is done before you can come into the building. Pt. Stated that he was glad that he called and now has no worries about coming for his lab visit.

## 2018-10-01 ENCOUNTER — Ambulatory Visit: Payer: Medicaid Other | Admitting: Podiatrist

## 2018-10-15 ENCOUNTER — Telehealth: Payer: Self-pay

## 2018-10-15 NOTE — Telephone Encounter (Signed)
Patient called requesting his lab appointment for tomorrow, having them done at [facility] will be in Care Everywhere.

## 2018-10-16 ENCOUNTER — Inpatient Hospital Stay: Payer: Medicaid Other

## 2018-10-23 ENCOUNTER — Telehealth: Payer: Self-pay

## 2018-10-23 NOTE — Telephone Encounter (Signed)
Spoke with patient notified him Dr. Mosetta Putt reviewed his lab results, she recommends phlebotomy and scheduling will give him a call.  He verbalized an understanding.

## 2018-10-27 ENCOUNTER — Telehealth: Payer: Self-pay | Admitting: Hematology

## 2018-10-27 NOTE — Telephone Encounter (Signed)
Scheduled appt per sch msg. Called and left msg for patient.  °

## 2018-10-31 ENCOUNTER — Inpatient Hospital Stay: Payer: Medicaid Other | Attending: Hematology

## 2018-10-31 ENCOUNTER — Other Ambulatory Visit: Payer: Self-pay

## 2018-10-31 NOTE — Patient Instructions (Signed)

## 2018-10-31 NOTE — Progress Notes (Signed)
John [name] presents today for phlebotomy per MD orders. 16 G used in R [facility] Phlebotomy procedure started at 1259 and ended at 42. 524 grams removed. Patient observed for 30 minutes after procedure without any incident. Pt offered food and drink. VSS at D/C Patient tolerated procedure well. IV needle removed intact.

## 2019-01-16 ENCOUNTER — Other Ambulatory Visit: Payer: Self-pay

## 2019-01-16 ENCOUNTER — Inpatient Hospital Stay: Payer: Medicaid Other | Attending: Hematology

## 2019-01-16 LAB — FERRITIN: Ferritin: 47 ng/mL (ref 24–336)

## 2019-01-16 LAB — IRON AND TIBC
Iron: 231 ug/dL — ABNORMAL HIGH (ref 42–163)
Saturation Ratios: 67 % — ABNORMAL HIGH (ref 20–55)
TIBC: 345 ug/dL (ref 202–409)
UIBC: 114 ug/dL — ABNORMAL LOW (ref 117–376)

## 2019-01-16 LAB — CBC WITH DIFFERENTIAL/PLATELET
Abs Immature Granulocytes: 0.02 10*3/uL (ref 0.00–0.07)
Basophils Absolute: 0 10*3/uL (ref 0.0–0.1)
Basophils Relative: 0 %
Eosinophils Absolute: 0.5 10*3/uL (ref 0.0–0.5)
Eosinophils Relative: 9 %
HCT: 42.1 % (ref 39.0–52.0)
Hemoglobin: 14.4 g/dL (ref 13.0–17.0)
Immature Granulocytes: 0 %
Lymphocytes Relative: 27 %
Lymphs Abs: 1.5 10*3/uL (ref 0.7–4.0)
MCH: 31.8 pg (ref 26.0–34.0)
MCHC: 34.2 g/dL (ref 30.0–36.0)
MCV: 92.9 fL (ref 80.0–100.0)
Monocytes Absolute: 0.5 10*3/uL (ref 0.1–1.0)
Monocytes Relative: 9 %
Neutro Abs: 3 10*3/uL (ref 1.7–7.7)
Neutrophils Relative %: 55 %
Platelets: 132 10*3/uL — ABNORMAL LOW (ref 150–400)
RBC: 4.53 MIL/uL (ref 4.22–5.81)
RDW: 12.8 % (ref 11.5–15.5)
WBC: 5.6 10*3/uL (ref 4.0–10.5)
nRBC: 0 % (ref 0.0–0.2)

## 2019-01-19 ENCOUNTER — Telehealth: Payer: Self-pay | Admitting: Hematology

## 2019-01-19 NOTE — Telephone Encounter (Signed)
Scheduled appt per 8/09 sch message - pt is aware of apt date and time

## 2019-01-23 ENCOUNTER — Other Ambulatory Visit: Payer: Self-pay

## 2019-01-23 ENCOUNTER — Inpatient Hospital Stay: Payer: Medicaid Other

## 2019-01-23 NOTE — Progress Notes (Signed)
424mL of blood received via phlebotomy. Pt requested that procedure be stopped due to "not feeling well". Pt became diaphoretic and described the feeling as "light headed". Procedure was terminated and patient was encouraged to deep breathe and drink fluids. Vital signs stable after procedure. Pt reported improvement in symptoms within 5 minutes of stopping procedure. Reported to Dr. Burr Medico, acknowledged and received OK to discharge after observation. Patient discharged at 1245pm in no distress.

## 2019-04-17 NOTE — Progress Notes (Signed)
[facility]   Telephone:(336) [phone] Fax:(336) [phone]   Clinic Follow up Note   Patient Care Team: Elizabeth PalauAnderson, Teresa, FNP as PCP - General (Nurse Practitioner) Angelyn PuntBruggen, Joel, MD as Referring Physician (Internal Medicine)  Date of Service:  04/20/2019  CHIEF COMPLAINT: F/u hemochromatosis    CURRENT THERAPY:  Phlebotomy to maintain a ferritin less than 50 or transferrin saturation about 50% , and hold phlebotomy if hemoglobin is less than 12. Last phlebotomy on 03/26/2018.  INTERVAL HISTORY:  John [name] is here for a follow up of hemochromatosis. He presents to the clinic alone. He notes he is doing well. He notes lately he has required phlebotomy every time he comes to clinic. He notes he has been having hand access during phlebotomy and notes the last time he had significant pain from it. He notes his A1c has much improved on last month's lab with them. He plans to have endoscopy with Dr Donnal DebarBruggen in 2021.    REVIEW OF SYSTEMS:   Constitutional: Denies fevers, chills or abnormal weight loss Eyes: Denies blurriness of vision Ears, nose, mouth, throat, and face: Denies mucositis or sore throat Respiratory: Denies cough, dyspnea or wheezes Cardiovascular: Denies palpitation, chest discomfort or lower extremity swelling Gastrointestinal:  Denies nausea, heartburn or change in bowel habits Skin: Denies abnormal skin rashes Lymphatics: Denies new lymphadenopathy or easy bruising Neurological:Denies numbness, tingling or new weaknesses Behavioral/Psych: Mood is stable, no new changes  All other systems were reviewed with the patient and are negative.  MEDICAL HISTORY:  Past Medical History:  Diagnosis Date  . Anxiety   . Diabetes (HCC)   . Foot pain left   to have MRI tues.   . Hepatitis C   . High cholesterol     SURGICAL HISTORY: Past Surgical History:  Procedure Laterality Date  . None      I have reviewed the social history and family history with  the patient and they are unchanged from previous note.  ALLERGIES:  is allergic to sulfa antibiotics; sulfamethoxazole; and testosterone.  MEDICATIONS:  Current Outpatient Medications  Medication Sig Dispense Refill  . gabapentin (NEURONTIN) 100 MG capsule Take 1 capsule (100 mg total) by mouth 2 (two) times daily. 90 capsule 11  . gabapentin (NEURONTIN) 300 MG capsule Take 1 capsule (300 mg total) by mouth at bedtime. 90 capsule 4  . glucagon 1 MG injection Inject 1 mg into the vein once as needed.    . insulin aspart (NOVOLOG) 100 UNIT/ML injection Inject 0-20 Units into the skin 3 (three) times daily before meals. Sliding scale    . insulin glargine (LANTUS) 100 UNIT/ML injection Inject 50 Units into the skin daily.     . Insulin Pen Needle (B-D ULTRAFINE III SHORT PEN) 31G X 8 MM MISC Injects 4 times daily    . ketoconazole (NIZORAL) 2 % cream Apply 1 application topically daily. 60 g 2  . propranolol (INDERAL) 40 MG tablet Take 40 mg by mouth 2 (two) times daily.      No current facility-administered medications for this visit.     PHYSICAL EXAMINATION: ECOG PERFORMANCE STATUS: 1 - Symptomatic but completely ambulatory  Vitals:   04/20/19 1124  BP: 130/74  Pulse: 66  Resp: 18  Temp: 98.1 F (36.7 C)  SpO2: 99%   Filed Weights   04/20/19 1124  Weight: 184 lb 14.4 oz (83.9 kg)    GENERAL:alert, no distress and comfortable SKIN: skin color, texture, turgor are normal, no rashes  or significant lesions EYES: normal, Conjunctiva are pink and non-injected, sclera clear  NECK: supple, thyroid normal size, non-tender, without nodularity LYMPH:  no palpable lymphadenopathy in the cervical, axillary  LUNGS: clear to auscultation and percussion with normal breathing effort HEART: regular rate & rhythm and no murmurs and no lower extremity edema ABDOMEN:abdomen soft, non-tender and normal bowel sounds Musculoskeletal:no cyanosis of digits and no clubbing  NEURO: alert & oriented  x 3 with fluent speech, no focal motor/sensory deficits  LABORATORY DATA:  I have reviewed the data as listed CBC Latest Ref Rng & Units 04/20/2019 01/16/2019 07/17/2018  WBC 4.0 - 10.5 K/uL 7.1 5.6 6.1  Hemoglobin 13.0 - 17.0 g/dL 24.0 97.3 53.2  Hematocrit 39.0 - 52.0 % 43.2 42.1 44.1  Platelets 150 - 400 K/uL 152 132(L) 141(L)     CMP Latest Ref Rng & Units 05/13/2014 01/28/2014 10/07/2013  Glucose 70 - 140 mg/dl 992(E) 268(T) 419(Q)  BUN 7.0 - 26.0 mg/dL 22.2 97.9 89.2  Creatinine 0.7 - 1.3 mg/dL 0.8 0.9 0.9  Sodium 119 - 145 mEq/L 138 136 133(L)  Potassium 3.5 - 5.1 mEq/L 4.0 3.9 4.2  Chloride 96 - 112 mEq/L - - -  CO2 22 - 29 mEq/L 29 28 23   Calcium 8.4 - 10.4 mg/dL 9.1 8.8 8.4  Total Protein 6.4 - 8.3 g/dL 7.5 6.7 7.0  Total Bilirubin 0.20 - 1.20 mg/dL 4.17 4.08  Alkaline Phos 40 - 150 U/L 142 130 170(H)  AST 5 - 34 U/L 52(H) 32 131(H)  ALT 0 - 55 U/L 41 19 88(H)      RADIOGRAPHIC STUDIES: I have personally reviewed the radiological images as listed and agreed with the findings in the report. No results found.   ASSESSMENT & PLAN:  [name] is a 63 y.o. male with   1. Hereditary hemochromatosis. -He has been compliant with lab and phlebotomy as needed every 2-3 months, but did miss a few appointmentsin 2018 -He had experienced 1 episode of syncope during phlebotomy, but overall tolerating well  -Last ECHO in 2016 was normal. He has been following up with Dr. 2017 for his liver cirrhosis  -He is clinically stable. Labs reviewed, Hg 15.1 and HCT 43.2%, normal. Iron panel still pending, will proceed with Phlebotomy if needed based on results.  -I reviewed his lab and phlebotomy in the past few years, his ferritin has been below 50 most of time, but the transferrin saturation has been above 50 most of time and he has required phlebotomy every 3 months.  To save his trip during the current Covid pandemic, I suggest him come in for lab and phlebotomy every 3 months  at the same time, and will proceed with phlebotomy based on his last lab results, as long as his H/H are normal, he agrees.  -F/u in 1 year.    2. Cytopenia  -His thrombocytopenia is likely secondary to liver cirrhosis and splenomegaly, no signs of bleeding. Continue to watch closely.  -resolved   3. DM2 secondary #1 -Continue insulin sliding scale and ketoconazole -Continue to follow-up with [facility]  5. 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 -Continue nadolol. He was counseled to report any bleeding.  -His HCV level is non-detected (labs in Care Everywhere) with normalization of his transiminases.  -He is under [facility] surveillance by ultrasound and lab every 6 months by Dr. [facility] -I reminded him that he is over due to liver Donnal Debar and  AFP marker, he will discuss with Dr. Liz Malady on his next visit. He plans to have endoscopy in 2021.   6. History of CHF -He is asymptomatic, clinically no signs of CHF. Stable.   7. Intermittent Vertigo  -Resolved after Apley's maneuver.  He is also on Neurontin -F/u with Neurologist Dr. Krista Blue     Plan  -I will call him with his lab results from today, and set up phlebotomy if ferritin less than 50 or transferrin saturation about 50% -Lab every 3 months with phlebotomy, based on last lab results  -F/u in 1 year    No problem-specific Assessment & Plan notes found for this encounter.   No orders of the defined types were placed in this encounter.  All questions were answered. The patient knows to call the clinic with any problems, questions or concerns. No barriers to learning was detected. I spent 15 minutes counseling the patient face to face. The total time spent in the appointment was 20 minutes and more than 50% was on counseling and review of test results     Truitt Merle, MD 04/20/2019   I, Joslyn Devon, am acting as scribe for Truitt Merle, MD.   I have reviewed the above  documentation for accuracy and completeness, and I agree with the above.

## 2019-04-20 ENCOUNTER — Inpatient Hospital Stay (HOSPITAL_BASED_OUTPATIENT_CLINIC_OR_DEPARTMENT_OTHER): Payer: Medicaid Other | Admitting: Hematology

## 2019-04-20 ENCOUNTER — Other Ambulatory Visit: Payer: Self-pay

## 2019-04-20 ENCOUNTER — Inpatient Hospital Stay: Payer: Medicaid Other | Attending: Hematology

## 2019-04-20 DIAGNOSIS — K7460 Unspecified cirrhosis of liver: Secondary | ICD-10-CM | POA: Diagnosis not present

## 2019-04-20 DIAGNOSIS — E119 Type 2 diabetes mellitus without complications: Secondary | ICD-10-CM | POA: Diagnosis not present

## 2019-04-20 DIAGNOSIS — Z794 Long term (current) use of insulin: Secondary | ICD-10-CM | POA: Diagnosis not present

## 2019-04-20 DIAGNOSIS — D696 Thrombocytopenia, unspecified: Secondary | ICD-10-CM | POA: Diagnosis not present

## 2019-04-20 DIAGNOSIS — R7401 Elevation of levels of liver transaminase levels: Secondary | ICD-10-CM | POA: Diagnosis not present

## 2019-04-20 DIAGNOSIS — E7800 Pure hypercholesterolemia, unspecified: Secondary | ICD-10-CM | POA: Insufficient documentation

## 2019-04-20 DIAGNOSIS — Z79899 Other long term (current) drug therapy: Secondary | ICD-10-CM | POA: Insufficient documentation

## 2019-04-20 DIAGNOSIS — B1920 Unspecified viral hepatitis C without hepatic coma: Secondary | ICD-10-CM | POA: Insufficient documentation

## 2019-04-20 DIAGNOSIS — R42 Dizziness and giddiness: Secondary | ICD-10-CM | POA: Insufficient documentation

## 2019-04-20 LAB — CBC WITH DIFFERENTIAL/PLATELET
Abs Immature Granulocytes: 0.05 10*3/uL (ref 0.00–0.07)
Basophils Absolute: 0 10*3/uL (ref 0.0–0.1)
Basophils Relative: 0 %
Eosinophils Absolute: 0.6 10*3/uL — ABNORMAL HIGH (ref 0.0–0.5)
Eosinophils Relative: 8 %
HCT: 43.2 % (ref 39.0–52.0)
Hemoglobin: 15.1 g/dL (ref 13.0–17.0)
Immature Granulocytes: 1 %
Lymphocytes Relative: 24 %
Lymphs Abs: 1.7 10*3/uL (ref 0.7–4.0)
MCH: 32.2 pg (ref 26.0–34.0)
MCHC: 35 g/dL (ref 30.0–36.0)
MCV: 92.1 fL (ref 80.0–100.0)
Monocytes Absolute: 0.7 10*3/uL (ref 0.1–1.0)
Monocytes Relative: 9 %
Neutro Abs: 4.1 10*3/uL (ref 1.7–7.7)
Neutrophils Relative %: 58 %
Platelets: 152 10*3/uL (ref 150–400)
RBC: 4.69 MIL/uL (ref 4.22–5.81)
RDW: 13.2 % (ref 11.5–15.5)
WBC: 7.1 10*3/uL (ref 4.0–10.5)
nRBC: 0 % (ref 0.0–0.2)

## 2019-04-20 LAB — FERRITIN: Ferritin: 43 ng/mL (ref 24–336)

## 2019-04-20 LAB — IRON AND TIBC
Iron: 295 ug/dL — ABNORMAL HIGH (ref 42–163)
Saturation Ratios: 88 % — ABNORMAL HIGH (ref 20–55)
TIBC: 335 ug/dL (ref 202–409)
UIBC: 40 ug/dL — ABNORMAL LOW (ref 117–376)

## 2019-04-21 ENCOUNTER — Telehealth: Payer: Self-pay | Admitting: Hematology

## 2019-04-21 ENCOUNTER — Encounter: Payer: Self-pay | Admitting: Hematology

## 2019-04-21 NOTE — Telephone Encounter (Signed)
Scheduled appt per 11/9 los. ° °Left a VM of the appt date and time. °

## 2019-04-28 ENCOUNTER — Telehealth: Payer: Self-pay | Admitting: *Deleted

## 2019-04-28 ENCOUNTER — Telehealth: Payer: Self-pay | Admitting: Hematology

## 2019-04-28 NOTE — Telephone Encounter (Signed)
Scheduled appt per 11/17 sch message - pt aware of appt date and time   

## 2019-04-28 NOTE — Telephone Encounter (Signed)
-----   Message from Truitt Merle, MD sent at 04/28/2019  9:04 AM EST ----- Please call pt and let him know his transferrin saturation was high, above the goal last time, please schedule phlebotomy for him next week, thanks   Truitt Merle  04/28/2019

## 2019-04-28 NOTE — Telephone Encounter (Signed)
Called pt & informed of elevated transferrin saturation & need for phlebotomy.  Message sent to schedulers to call pt with phlebotomy schedule.

## 2019-05-04 ENCOUNTER — Inpatient Hospital Stay: Payer: Medicaid Other

## 2019-05-04 ENCOUNTER — Other Ambulatory Visit: Payer: Self-pay

## 2019-05-04 NOTE — Patient Instructions (Signed)

## 2019-05-04 NOTE — Progress Notes (Signed)
John [name] presents today for phlebotomy per MD orders. Phlebotomy procedure started at 0933 and ended at 0938. 508 grams removed. 16 g. In R antecubital without difficulty. Patient observed for 30 minutes after procedure without any incident. Patient tolerated procedure well. IV needle removed intact.  Pt declined food or drink. VS stable.

## 2019-05-14 ENCOUNTER — Other Ambulatory Visit: Payer: Self-pay | Admitting: Neurology

## 2019-05-18 ENCOUNTER — Other Ambulatory Visit: Payer: Self-pay | Admitting: Neurology

## 2019-05-27 ENCOUNTER — Telehealth: Payer: Self-pay | Admitting: Neurology

## 2019-05-27 ENCOUNTER — Other Ambulatory Visit: Payer: Self-pay | Admitting: Neurology

## 2019-05-27 MED ORDER — GABAPENTIN 300 MG PO CAPS: 300.0000 mg | ORAL_CAPSULE | Freq: Every day | ORAL | 0 refills | Status: DC

## 2019-05-27 MED ORDER — GABAPENTIN 100 MG PO CAPS: 100.0000 mg | ORAL_CAPSULE | Freq: Three times a day (TID) | ORAL | 0 refills | Status: DC

## 2019-05-27 NOTE — Telephone Encounter (Signed)
Pt has yearly pending appt on 08/18/2019.  Refill sent to pharmacy to last until his follow up.

## 2019-05-27 NOTE — Addendum Note (Signed)
Addended by: Noberto Retort C on: 05/27/2019 10:57 AM   Modules accepted: Orders

## 2019-05-27 NOTE — Telephone Encounter (Signed)
Pt has scheduled his annual f/u as a My Chart visit and he is on wait list.  Pt is asking if he can get refills until his annual f/u gabapentin (NEURONTIN) 300 MG capsule & gabapentin (NEURONTIN) 100 MG capsule [facility]

## 2019-07-21 ENCOUNTER — Inpatient Hospital Stay: Payer: Medicaid Other

## 2019-07-21 ENCOUNTER — Other Ambulatory Visit: Payer: Self-pay

## 2019-07-21 ENCOUNTER — Inpatient Hospital Stay: Payer: Medicaid Other | Attending: Hematology

## 2019-07-21 LAB — CBC WITH DIFFERENTIAL/PLATELET
Abs Immature Granulocytes: 0.04 10*3/uL (ref 0.00–0.07)
Basophils Absolute: 0 10*3/uL (ref 0.0–0.1)
Basophils Relative: 0 %
Eosinophils Absolute: 0.5 10*3/uL (ref 0.0–0.5)
Eosinophils Relative: 8 %
HCT: 42.5 % (ref 39.0–52.0)
Hemoglobin: 14.8 g/dL (ref 13.0–17.0)
Immature Granulocytes: 1 %
Lymphocytes Relative: 25 %
Lymphs Abs: 1.7 10*3/uL (ref 0.7–4.0)
MCH: 32.5 pg (ref 26.0–34.0)
MCHC: 34.8 g/dL (ref 30.0–36.0)
MCV: 93.2 fL (ref 80.0–100.0)
Monocytes Absolute: 0.7 10*3/uL (ref 0.1–1.0)
Monocytes Relative: 10 %
Neutro Abs: 3.9 10*3/uL (ref 1.7–7.7)
Neutrophils Relative %: 56 %
Platelets: 120 10*3/uL — ABNORMAL LOW (ref 150–400)
RBC: 4.56 MIL/uL (ref 4.22–5.81)
RDW: 12.7 % (ref 11.5–15.5)
WBC: 6.8 10*3/uL (ref 4.0–10.5)
nRBC: 0 % (ref 0.0–0.2)

## 2019-07-21 LAB — IRON AND TIBC
Iron: 301 ug/dL — ABNORMAL HIGH (ref 42–163)
Saturation Ratios: 103 % — ABNORMAL HIGH (ref 20–55)
TIBC: 291 ug/dL (ref 202–409)
UIBC: UNDETERMINED ug/dL (ref 117–376)

## 2019-07-21 LAB — FERRITIN: Ferritin: 81 ng/mL (ref 24–336)

## 2019-07-21 NOTE — Patient Instructions (Signed)
Therapeutic Phlebotomy, Care After This sheet gives you information about how to care for yourself after your procedure. Your health care provider may also give you more specific instructions. If you have problems or questions, contact your health care provider. What can I expect after the procedure? After the procedure, it is common to have:  Light-headedness or dizziness. You may feel faint.  Nausea.  Tiredness (fatigue). Follow these instructions at home: Eating and drinking  Be sure to eat well-balanced meals for the next 24 hours.  Drink enough fluid to keep your urine pale yellow.  Avoid drinking alcohol on the day that you had the procedure. Activity   Return to your normal activities as told by your health care provider. Most people can go back to their normal activities right away.  Avoid activities that take a lot of effort for about 5 hours after the procedure. Athletes should avoid strenuous exercise for at least 12 hours.  Avoid heavy lifting or pulling for about 5 hours after the procedure. Do not lift anything that is heavier than 10 lb (4.5 kg).  Change positions slowly for the remainder of the day. This will help to prevent light-headedness or fainting.  If you feel light-headed, lie down until the feeling goes away. Needle insertion site care   Keep your bandage (dressing) dry. You can remove the bandage after about 5 hours or as told by your health care provider.  If you have bleeding from the needle insertion site, raise (elevate) your arm and press firmly on the site until the bleeding stops.  If you have bruising at the site, apply ice to the area: ? Remove the dressing. ? Put ice in a plastic bag. ? Place a towel between your skin and the bag. ? Leave the ice on for 20 minutes, 2-3 times a day for the first 24 hours.  If the swelling does not go away after 24 hours, apply a warm, moist cloth (warm compress) to the area for 20 minutes, 2-3 times a  day. General instructions  Do not use any products that contain nicotine or tobacco, such as cigarettes and e-cigarettes, for at least 30 minutes after the procedure.  Keep all follow-up visits as told by your health care provider. This is important. You may need to continue having regular therapeutic phlebotomy treatments as directed. Contact a health care provider if you:  Have redness, swelling, or pain at the needle insertion site.  Have fluid or blood coming from the needle insertion site.  Have pus or a bad smell coming from the needle insertion site.  Notice that the needle insertion site feels warm to the touch.  Feel light-headed, dizzy, or nauseous, and the feeling does not go away.  Have new bruising at the needle insertion site.  Feel weaker than normal.  Have a fever or chills. Get help right away if:  You faint.  You have chest pain.  You have trouble breathing.  You have severe nausea or vomiting. Summary  After the procedure, it is common to have some light-headedness, dizziness, nausea, or tiredness (fatigue).  Be sure to eat well-balanced meals for the next 24 hours. Drink enough fluid to keep your urine pale yellow.  Return to your normal activities as told by your health care provider.  Keep all follow-up visits as told by your health care provider. You may need to continue having regular therapeutic phlebotomy treatments as directed. This information is not intended to replace advice given to you   by your health care provider. Make sure you discuss any questions you have with your health care provider. Document Revised: 06/14/2017 Document Reviewed: 06/13/2017 Elsevier Patient Education  2020 Elsevier Inc.  Coronavirus (COVID-19) Are you at risk?  Are you at risk for the Coronavirus (COVID-19)?  To be considered HIGH RISK for Coronavirus (COVID-19), you have to meet the following criteria:  . Traveled to [place], [place], [place], [place] or [place];  or in the [place] to [place], [place], [place], or [place]; and have fever, cough, and shortness of breath within the last 2 weeks of travel OR . Been in close contact with a person diagnosed with COVID-19 within the last 2 weeks and have fever, cough, and shortness of breath . IF YOU DO NOT MEET THESE CRITERIA, YOU ARE CONSIDERED LOW RISK FOR COVID-19.  What to do if you are HIGH RISK for COVID-19?  . If you are having a medical emergency, call 911. . Seek medical care right away. Before you go to a doctor's office, urgent care or emergency department, call ahead and tell them about your recent travel, contact with someone diagnosed with COVID-19, and your symptoms. You should receive instructions from your physician's office regarding next steps of care.  . When you arrive at healthcare provider, tell the healthcare staff immediately you have returned from visiting [place], [place], [place], [place] or [place]; or traveled in the [place] to [place], [place], [place], or [place]; in the last two weeks or you have been in close contact with a person diagnosed with COVID-19 in the last 2 weeks.   . Tell the health care staff about your symptoms: fever, cough and shortness of breath. . After you have been seen by a medical provider, you will be either: o Tested for (COVID-19) and discharged home on quarantine except to seek medical care if symptoms worsen, and asked to  - Stay home and avoid contact with others until you get your results (4-5 days)  - Avoid travel on public transportation if possible (such as bus, train, or airplane) or o Sent to the Emergency Department by EMS for evaluation, COVID-19 testing, and possible admission depending on your condition and test results.  What to do if you are LOW RISK for COVID-19?  Reduce your risk of any infection by using the same precautions used for avoiding the common cold or flu:  . Wash your hands often with soap and  warm water for at least 20 seconds.  If soap and water are not readily available, use an alcohol-based hand sanitizer with at least 60% alcohol.  . If coughing or sneezing, cover your mouth and nose by coughing or sneezing into the elbow areas of your shirt or coat, into a tissue or into your sleeve (not your hands). . Avoid shaking hands with others and consider head nods or verbal greetings only. . Avoid touching your eyes, nose, or mouth with unwashed hands.  . Avoid close contact with people who are sick. . Avoid places or events with large numbers of people in one location, like concerts or sporting events. . Carefully consider travel plans you have or are making. . If you are planning any travel outside or inside the [place], visit the CDC's Travelers' Health webpage for the latest health notices. . If you have some symptoms but not all symptoms, continue to monitor at home and seek medical attention if your symptoms worsen. . If you are having a medical   emergency, call 911.   ADDITIONAL HEALTHCARE OPTIONS FOR PATIENTS  [facility] / e-Visit: [email or web address]         [facility]: [phone]  [facility]: [phone]                   [facility]: [phone]  

## 2019-07-21 NOTE — Progress Notes (Signed)
John [name] presents today for phlebotomy per MD orders. Phlebotomy procedure started at 1405 and ended at 1412 531 grams removed. Patient observed for 30 minutes after procedure without any incident. Patient tolerated procedure well. IV needle removed intact. Pt. offered and took  fluids and snack. Pt. left via ambulation, no shortness of breath noted.

## 2019-08-17 NOTE — Progress Notes (Signed)
Virtual Visit via Video Note  I connected with John [name] on 08/18/19 at  7:45 AM EST by a video enabled telemedicine application and verified that I am speaking with the correct person using two identifiers.  Location: Patient: at his home Provider: in the office   I discussed the limitations of evaluation and management by telemedicine and the availability of in person appointments. The patient expressed understanding and agreed to proceed.  History of Present Illness: John [name] is a 64 years old male, seen in request by his primary care physician for evaluation of vertigo, Initial evaluation was on February 25, 2018.  I have reviewed and summarized the referring note from the referring physician.  He had past medical history of hepatitis C, stage IV cirrhosis, has received treatment, diabetes, hyperlipidemia, hemochromatosis,  I saw him previously for motor and vocal tics,  In 2007, he presented with generalized weakness, glucose more than 1000, he was found to have hemochromatosis, stage IV hepatic cirrhosis based on biopsy, evaluation was done at [facility] was diagnosed with hepatitis C at the same time, was not sure the source of infection, he had tatto before 1980s. Around that time, he also developed episode of uncontrollable body jerking movement, making sound sometimes, it progressively worsened over the years, During interview, he had multiple episodes of uncontrollable sudden body flexion, making sound, no loss of consciousness, He was previously diagnosed with motor tics, was given prescription of clonazepam, but he rarely tries it, worry about the side effect, clonazepam would put him into sleep, also worry about the side effect of addiction, liver damage. [place] 05/2013 showed Hepatic cirrhosis. Multiple echogenic nodules within a heterogenous liver parenchyma. Cholelithiasis with tumefactive sludge. Persistent gallbladder wall thickening is likely related to  underlying liver disease. He was previously evaluated by neurologists at [facility] for similar complains, was given prescription of gabapentin, which does help him some. He also complains of right hip pain, difficulty crossing his legs because of hip pain, radiating pain to his right groin area  MRI of the brain which showed mild small vessel disease, no acute lesions, no significant basal ganglion signal changes. X-ray of right hip was essentially normal, He was seen by [facility] resident Neurology clinic in November 2013, was diagnosed with complex tic disorder,  He undergoes phlebotomy every 2 weeks since 05/2013; he is seeing an outside hematologist at [facility] in [place].He does not have hepatic encephalopathy or ascites. He takes lactulose prn for constipation. He is on nadolol for varices, but has had no bleeding events.He responded very well to Neurontin 300 mg 3 times a day, but complains of tongue swelling with certain generic form of gabapentin   He noticed subacute onset of vertigo since March 2019, has been persistent since then, if he turns his head suddenly to one direction, he would have sudden onset spinning sensation, dizziness, even when he lies down at bedtime, especially when he turned to the left side, transient, last for couple minutes.  He denies hearing loss, denies lateralized motor or sensory deficit.  UPDATE Apr 28 2018: He responded very well to repositioning maneuver, no longer has vertigo, he continue has motor tics, especially at nighttime, he is tired, anxious, gabapentin has been helpful, he is taking gabapentin 100 mg twice a day, and 300 mg at nighttime,  Update August 18, 2019 SS: Virtual Visit, needs refills of gabapentin. No problems with dizziness. Has not had to do Epley Maneuver, he says he didn't  like it. Motor tics in extremities, face, can make vocal noises, been on gabapentin for at least 10 years. Got 2nd dose of COVID vaccine, feeling much more secure.  Has less stress, not as frequent motor tics that can be with high level anxiety. He still has at night, where he may jerks can't stop it, they stop when he lays down, and goes to sleep, or changing activity or concentrating on other things. Overall, gabapentin is working well for him. He has hemachromatosis, gets phlebotomy every 3 months, sees hematology.    Observations/Objective: VV, is alert and oriented, speech is clear and concise, facial symmetry noted, follows commands, no arm drift, gait appears intact and steady, no tremor or tics noted  Assessment and Plan: 1.  Acute onset of vertigo -Most consistent with benign positional vertigo -Confirmed and helped by Epley maneuver previously -Is no longer an issue, stable  2.  Motor tics -Stable, controlled with gabapentin -Continue gabapentin 100 mg 3 times daily, 300 mg at bedtime  Follow Up Instructions: As needed, will fill gabapentin for 1 year, subsequent refills can come from PCP   I discussed the assessment and treatment plan with the patient. The patient was provided an opportunity to ask questions and all were answered. The patient agreed with the plan and demonstrated an understanding of the instructions.   The patient was advised to call back or seek an in-person evaluation if the symptoms worsen or if the condition fails to improve as anticipated.  I provided 15 minutes of non-face-to-face time during this encounter.  Evangeline Dakin, DNP  [facility] [address] [place], [place] [address] [phone]

## 2019-08-18 ENCOUNTER — Encounter: Payer: Self-pay | Admitting: Neurology

## 2019-08-18 ENCOUNTER — Telehealth (INDEPENDENT_AMBULATORY_CARE_PROVIDER_SITE_OTHER): Payer: Medicaid Other | Admitting: Neurology

## 2019-08-18 DIAGNOSIS — F951 Chronic motor or vocal tic disorder: Secondary | ICD-10-CM | POA: Diagnosis not present

## 2019-08-18 MED ORDER — GABAPENTIN 300 MG PO CAPS: 300.0000 mg | ORAL_CAPSULE | Freq: Every day | ORAL | 3 refills | Status: DC

## 2019-08-18 MED ORDER — GABAPENTIN 100 MG PO CAPS: 100.0000 mg | ORAL_CAPSULE | Freq: Three times a day (TID) | ORAL | 3 refills | Status: DC

## 2019-08-19 NOTE — Progress Notes (Signed)
I have reviewed and agreed above plan. 

## 2019-10-19 ENCOUNTER — Inpatient Hospital Stay: Payer: Medicaid Other | Attending: Hematology

## 2019-10-19 ENCOUNTER — Inpatient Hospital Stay: Payer: Medicaid Other

## 2019-10-19 ENCOUNTER — Other Ambulatory Visit: Payer: Self-pay

## 2019-10-19 LAB — CBC WITH DIFFERENTIAL/PLATELET
Abs Immature Granulocytes: 0.04 10*3/uL (ref 0.00–0.07)
Basophils Absolute: 0 10*3/uL (ref 0.0–0.1)
Basophils Relative: 0 %
Eosinophils Absolute: 0.5 10*3/uL (ref 0.0–0.5)
Eosinophils Relative: 7 %
HCT: 43.4 % (ref 39.0–52.0)
Hemoglobin: 14.8 g/dL (ref 13.0–17.0)
Immature Granulocytes: 1 %
Lymphocytes Relative: 25 %
Lymphs Abs: 1.7 10*3/uL (ref 0.7–4.0)
MCH: 32 pg (ref 26.0–34.0)
MCHC: 34.1 g/dL (ref 30.0–36.0)
MCV: 93.9 fL (ref 80.0–100.0)
Monocytes Absolute: 0.7 10*3/uL (ref 0.1–1.0)
Monocytes Relative: 11 %
Neutro Abs: 3.8 10*3/uL (ref 1.7–7.7)
Neutrophils Relative %: 56 %
Platelets: 162 10*3/uL (ref 150–400)
RBC: 4.62 MIL/uL (ref 4.22–5.81)
RDW: 13 % (ref 11.5–15.5)
WBC: 6.8 10*3/uL (ref 4.0–10.5)
nRBC: 0 % (ref 0.0–0.2)

## 2019-10-19 LAB — IRON AND TIBC
Iron: 157 ug/dL (ref 42–163)
Saturation Ratios: 45 % (ref 20–55)
TIBC: 351 ug/dL (ref 202–409)
UIBC: 195 ug/dL (ref 117–376)

## 2019-10-19 LAB — FERRITIN: Ferritin: 57 ng/mL (ref 24–336)

## 2019-10-19 NOTE — Progress Notes (Signed)
John [name] presents today for phlebotomy per MD orders. Phlebotomy procedure started at 1235 and ended at 1240 via 16G to RAC. 531 grams removed. IV needle removed intact. Food and drink provided. Patient tolerated procedure well, observed for 30 minutes after procedure without any incident.

## 2019-10-19 NOTE — Progress Notes (Signed)
BP 91/66 post procedure.  No complaints.  Pt feeling well.  Pt d/c'd to home.  Encouraged to push fluids.

## 2019-10-19 NOTE — Patient Instructions (Signed)

## 2020-01-18 ENCOUNTER — Telehealth: Payer: Self-pay | Admitting: Hematology

## 2020-01-18 NOTE — Telephone Encounter (Signed)
Per patient request, r/s 8/10 appt to 8/17

## 2020-01-19 ENCOUNTER — Inpatient Hospital Stay: Payer: Medicaid Other

## 2020-01-26 ENCOUNTER — Encounter: Payer: Self-pay | Admitting: Hematology

## 2020-01-26 ENCOUNTER — Inpatient Hospital Stay: Payer: Medicaid Other

## 2020-01-26 ENCOUNTER — Inpatient Hospital Stay: Payer: Medicaid Other | Attending: Hematology

## 2020-01-26 ENCOUNTER — Other Ambulatory Visit: Payer: Self-pay

## 2020-01-26 LAB — CBC WITH DIFFERENTIAL/PLATELET
Abs Immature Granulocytes: 0.02 10*3/uL (ref 0.00–0.07)
Basophils Absolute: 0 10*3/uL (ref 0.0–0.1)
Basophils Relative: 1 %
Eosinophils Absolute: 0.5 10*3/uL (ref 0.0–0.5)
Eosinophils Relative: 9 %
HCT: 40.5 % (ref 39.0–52.0)
Hemoglobin: 13.6 g/dL (ref 13.0–17.0)
Immature Granulocytes: 0 %
Lymphocytes Relative: 21 %
Lymphs Abs: 1.1 10*3/uL (ref 0.7–4.0)
MCH: 30.3 pg (ref 26.0–34.0)
MCHC: 33.6 g/dL (ref 30.0–36.0)
MCV: 90.2 fL (ref 80.0–100.0)
Monocytes Absolute: 0.6 10*3/uL (ref 0.1–1.0)
Monocytes Relative: 11 %
Neutro Abs: 3 10*3/uL (ref 1.7–7.7)
Neutrophils Relative %: 58 %
Platelets: 142 10*3/uL — ABNORMAL LOW (ref 150–400)
RBC: 4.49 MIL/uL (ref 4.22–5.81)
RDW: 14.1 % (ref 11.5–15.5)
WBC: 5.2 10*3/uL (ref 4.0–10.5)
nRBC: 0 % (ref 0.0–0.2)

## 2020-01-26 LAB — FERRITIN: Ferritin: 34 ng/mL (ref 24–336)

## 2020-01-26 LAB — IRON AND TIBC
Iron: 73 ug/dL (ref 42–163)
Saturation Ratios: 21 % (ref 20–55)
TIBC: 345 ug/dL (ref 202–409)
UIBC: 272 ug/dL (ref 117–376)

## 2020-01-26 NOTE — Patient Instructions (Signed)

## 2020-01-26 NOTE — Progress Notes (Signed)
John [name] presents today for phlebotomy per MD orders. Phlebotomy procedure started at 0908 and ended at 09:26 544 cc removed. Patient tolerated procedure well. IV needle removed intact. Food and Fluids given. Pt. observed for 30 minutes post procedure with out issues. Pt. left via ambulation, no respiratory distress noted.

## 2020-02-16 ENCOUNTER — Encounter: Payer: Self-pay | Admitting: Hematology

## 2020-03-24 DIAGNOSIS — E1065 Type 1 diabetes mellitus with hyperglycemia: Secondary | ICD-10-CM | POA: Insufficient documentation

## 2020-04-15 NOTE — Progress Notes (Addendum)
[facility]   Telephone:(336) [phone] [phone]   Clinic Follow up Note   Patient Care Team: John Palau, FNP as PCP - General (Nurse Practitioner) John Punt, MD as Referring Physician (Internal Medicine)  Date of Service:  04/20/2020  CHIEF COMPLAINT:  F/u hemochromatosis    CURRENT THERAPY:  Phlebotomy to maintain a ferritin less than 50 or transferrin saturation about 50% , and hold phlebotomy if hemoglobin is less than 12. Last phlebotomy on 03/26/2018.  INTERVAL HISTORY:  John [name] is here for a follow up of Hemochromatosis. He was last seen by me 1 year ago. She presents to the clinic alone. He notes he is doing well. He is fine to have US Abdomen done in [place] where he lives. He sees Dr Alicia Amel every 6 months. He noes he plans to switch his DM management to his PCP. He notes he received his COVID booster last week.    REVIEW OF SYSTEMS:   Constitutional: Denies fevers, chills or abnormal weight loss Eyes: Denies blurriness of vision Ears, nose, mouth, throat, and face: Denies mucositis or sore throat Respiratory: Denies cough, dyspnea or wheezes Cardiovascular: Denies palpitation, chest discomfort or lower extremity swelling Gastrointestinal:  Denies nausea, heartburn or change in bowel habits Skin: Denies abnormal skin rashes Lymphatics: Denies new lymphadenopathy or easy bruising Neurological:Denies numbness, tingling or new weaknesses Behavioral/Psych: Mood is stable, no new changes  All other systems were reviewed with the patient and are negative.  MEDICAL HISTORY:  Past Medical History:  Diagnosis Date   Anxiety    Diabetes (HCC)    Foot pain left   to have MRI tues.    Hepatitis C    High cholesterol     SURGICAL HISTORY: Past Surgical History:  Procedure Laterality Date   None      I have reviewed the social history and family history with the patient and they are unchanged from previous  note.  ALLERGIES:  is allergic to sulfa antibiotics, sulfamethoxazole, and testosterone.  MEDICATIONS:  Current Outpatient Medications  Medication Sig Dispense Refill   gabapentin (NEURONTIN) 100 MG capsule Take 1 capsule (100 mg total) by mouth 3 (three) times daily. 270 capsule 3   gabapentin (NEURONTIN) 300 MG capsule Take 1 capsule (300 mg total) by mouth at bedtime. 90 capsule 3   glucagon 1 MG injection Inject 1 mg into the vein once as needed.     insulin aspart (NOVOLOG) 100 UNIT/ML injection Inject 0-20 Units into the skin 3 (three) times daily before meals. Sliding scale     insulin glargine (LANTUS) 100 UNIT/ML injection Inject 50 Units into the skin daily.      Insulin Pen Needle (B-D ULTRAFINE III SHORT PEN) 31G X 8 MM MISC Injects 4 times daily     ketoconazole (NIZORAL) 2 % cream Apply 1 application topically daily. (Patient not taking: Reported on 01/26/2020) 60 g 2   propranolol (INDERAL) 40 MG tablet Take 40 mg by mouth 2 (two) times daily.      No current facility-administered medications for this visit.    PHYSICAL EXAMINATION: ECOG PERFORMANCE STATUS: 1 - Symptomatic but completely ambulatory  Vitals:   04/20/20 1321  BP: 109/60  Pulse: 77  Resp: 18  Temp: 97.8 F (36.6 C)  SpO2: 98%   Filed Weights   04/20/20 1321  Weight: 180 lb (81.6 kg)    GENERAL:alert, no distress and comfortable SKIN: skin color, texture, turgor are normal, no rashes or significant lesions  EYES: normal, Conjunctiva are pink and non-injected, sclera clear  NECK: supple, thyroid normal size, non-tender, without nodularity LYMPH:  no palpable lymphadenopathy in the cervical, axillary  LUNGS: clear to auscultation and percussion with normal breathing effort HEART: regular rate & rhythm and no murmurs and no lower extremity edema ABDOMEN:abdomen soft, non-tender and normal bowel sounds Musculoskeletal:no cyanosis of digits and no clubbing  NEURO: alert & oriented x 3 with fluent  speech, no focal motor/sensory deficits  LABORATORY DATA:  I have reviewed the data as listed CBC Latest Ref Rng & Units 04/20/2020 01/26/2020 10/19/2019  WBC 4.0 - 10.5 K/uL 6.3 5.2 6.8  Hemoglobin 13.0 - 17.0 g/dL 11.9 41.7 40.8  Hematocrit 39 - 52 % 38.8(L) 40.5 43.4  Platelets 150 - 400 K/uL 143(L) 142(L) 162     CMP Latest Ref Rng & Units 05/13/2014 01/28/2014 10/07/2013  Glucose 70 - 140 mg/dl 144(Y) 185(U) 314(H)  BUN 7.0 - 26.0 mg/dL 70.2 63.7 85.8  Creatinine 0.7 - 1.3 mg/dL 0.8 0.9 0.9  Sodium 850 - 145 mEq/L 138 136 133(L)  Potassium 3.5 - 5.1 mEq/L 4.0 3.9 4.2  Chloride 96 - 112 mEq/L - - -  CO2 22 - 29 mEq/L 29 28 23   Calcium 8.4 - 10.4 mg/dL 9.1 8.8 8.4  Total Protein 6.4 - 8.3 g/dL 7.5 6.7 7.0  Total Bilirubin 0.20 - 1.20 mg/dL 2.77 4.12  Alkaline Phos 40 - 150 U/L 142 130 170(H)  AST 5 - 34 U/L 52(H) 32 131(H)  ALT 0 - 55 U/L 41 19 88(H)      RADIOGRAPHIC STUDIES: I have personally reviewed the radiological images as listed and agreed with the findings in the report. No results found.   ASSESSMENT & PLAN:  John [name] is a 64 y.o. male with    1. Hereditary hemochromatosis.  -He has been compliant with lab and phlebotomy as needed every 2-3 months, but did miss a few appointments in 2018 -He had experienced 1 episode of syncope during phlebotomy, but overall tolerating well. -He has been following up with Dr. 2019 for his liver cirrhosis  -He is clinically doing well. He had phlebotomy 3 times in the past year. Physical exam unremarkable. Labs reviewed, CBC WNL except plt 143K, Hct 38.8. Iron panel still pending. He does not need phlebotomy today.  -Continue lab with phlebotomy every 3 months  -F/u in 1 year.    2. Thrombocytopenia  -His thrombocytopenia is likely secondary to liver cirrhosis and splenomegaly, no signs of bleeding.  -Thrombocytopenia intermittent now.    3. DM2 secondary #1 -Continue insulin sliding scale and  ketoconazole -Continue to follow-up with PCP for better management.    5. 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  -Continue nadolol. He was counseled to report any bleeding.  -His HCV level is non-detected (labs in Care Everywhere) with normalization of his transiminases.  -I recommend him to continue HCC surveillance by ultrasound and AFP every 6 months. He is not seeing Dr. Donnal Debar frequently, and it's more convenient for him to do Donnal Debar here. I will order for him, next in 04/2020.  -Continue to f/u with Dr. 05/2020 for endoscopy    6. History of CHF -He is asymptomatic, clinically no signs of CHF. Stable.    7. Intermittent Vertigo  -F/u with Neurologist Dr. Donnal Debar. He is also on Neurontin   Plan  -Labs reviewed, no need for phlebotomy today  -[facility] [place] abdomen in 2 weeks, 6 months  and 12 months  -AFP every 6 months -will discuss with Dr. Donnal Debar to coordinate his care  -Lab every 3 months with phlebotomy X4 (hold if ferritin less than 50 and transferrin saturation < 50% on last lab) -F/u in 1 year.    No problem-specific Assessment & Plan notes found for this encounter.   Orders Placed This Encounter  Procedures   US Abdomen Complete    Standing Status:   Future    Standing Expiration Date:   04/20/2021    Order Specific Question:   Reason for Exam (SYMPTOM  OR DIAGNOSIS REQUIRED)    Answer:   liver cancer screening    Order Specific Question:   Preferred imaging location?    Answer:   [facility]   US Abdomen Complete    Standing Status:   Future    Standing Expiration Date:   04/20/2021    Order Specific Question:   Reason for Exam (SYMPTOM  OR DIAGNOSIS REQUIRED)    Answer:   liver cancer screening    Order Specific Question:   Preferred imaging location?    Answer:   [facility]    Order Specific Question:   Release to patient    Answer:   Immediate   US Abdomen Complete    Standing Status:   Future    Standing  Expiration Date:   04/20/2021    Order Specific Question:   Reason for Exam (SYMPTOM  OR DIAGNOSIS REQUIRED)    Answer:   liver cancer screening    Order Specific Question:   Preferred imaging location?    Answer:   [facility]   AFP tumor marker    Standing Status:   Standing    Number of Occurrences:   2    Standing Expiration Date:   04/20/2021   All questions were answered. The patient knows to call the clinic with any problems, questions or concerns. No barriers to learning was detected. The total time spent in the appointment was 30 minutes.     Malachy Mood, MD 04/20/2020   I, Delphina Cahill, am acting as scribe for Malachy Mood, MD.   I have reviewed the above documentation for accuracy and completeness, and I agree with the above.

## 2020-04-20 ENCOUNTER — Inpatient Hospital Stay: Payer: Medicaid Other

## 2020-04-20 ENCOUNTER — Encounter: Payer: Self-pay | Admitting: Hematology

## 2020-04-20 ENCOUNTER — Inpatient Hospital Stay (HOSPITAL_BASED_OUTPATIENT_CLINIC_OR_DEPARTMENT_OTHER): Payer: Medicaid Other | Admitting: Hematology

## 2020-04-20 ENCOUNTER — Inpatient Hospital Stay: Payer: Medicaid Other | Attending: Hematology

## 2020-04-20 ENCOUNTER — Other Ambulatory Visit: Payer: Self-pay

## 2020-04-20 DIAGNOSIS — E7800 Pure hypercholesterolemia, unspecified: Secondary | ICD-10-CM | POA: Diagnosis not present

## 2020-04-20 DIAGNOSIS — Z79899 Other long term (current) drug therapy: Secondary | ICD-10-CM | POA: Insufficient documentation

## 2020-04-20 DIAGNOSIS — E119 Type 2 diabetes mellitus without complications: Secondary | ICD-10-CM | POA: Insufficient documentation

## 2020-04-20 DIAGNOSIS — F419 Anxiety disorder, unspecified: Secondary | ICD-10-CM | POA: Diagnosis not present

## 2020-04-20 DIAGNOSIS — K7469 Other cirrhosis of liver: Secondary | ICD-10-CM | POA: Diagnosis not present

## 2020-04-20 DIAGNOSIS — R55 Syncope and collapse: Secondary | ICD-10-CM | POA: Insufficient documentation

## 2020-04-20 LAB — IRON AND TIBC
Iron: 177 ug/dL — ABNORMAL HIGH (ref 42–163)
Saturation Ratios: 44 % (ref 20–55)
TIBC: 399 ug/dL (ref 202–409)
UIBC: 222 ug/dL (ref 117–376)

## 2020-04-20 LAB — CBC WITH DIFFERENTIAL/PLATELET
Abs Immature Granulocytes: 0.02 10*3/uL (ref 0.00–0.07)
Basophils Absolute: 0 10*3/uL (ref 0.0–0.1)
Basophils Relative: 0 %
Eosinophils Absolute: 0.3 10*3/uL (ref 0.0–0.5)
Eosinophils Relative: 5 %
HCT: 38.8 % — ABNORMAL LOW (ref 39.0–52.0)
Hemoglobin: 13 g/dL (ref 13.0–17.0)
Immature Granulocytes: 0 %
Lymphocytes Relative: 21 %
Lymphs Abs: 1.3 10*3/uL (ref 0.7–4.0)
MCH: 29.5 pg (ref 26.0–34.0)
MCHC: 33.5 g/dL (ref 30.0–36.0)
MCV: 88.2 fL (ref 80.0–100.0)
Monocytes Absolute: 0.7 10*3/uL (ref 0.1–1.0)
Monocytes Relative: 10 %
Neutro Abs: 4 10*3/uL (ref 1.7–7.7)
Neutrophils Relative %: 64 %
Platelets: 143 10*3/uL — ABNORMAL LOW (ref 150–400)
RBC: 4.4 MIL/uL (ref 4.22–5.81)
RDW: 14.1 % (ref 11.5–15.5)
WBC: 6.3 10*3/uL (ref 4.0–10.5)
nRBC: 0 % (ref 0.0–0.2)

## 2020-04-20 LAB — FERRITIN: Ferritin: 52 ng/mL (ref 24–336)

## 2020-04-21 ENCOUNTER — Telehealth: Payer: Self-pay | Admitting: Hematology

## 2020-04-21 NOTE — Telephone Encounter (Signed)
Scheduled per 11/10 los. Unable to reach pt. Left voicemail with appt times and dates. 

## 2020-04-23 ENCOUNTER — Encounter: Payer: Self-pay | Admitting: Hematology

## 2020-04-28 ENCOUNTER — Telehealth: Payer: Self-pay

## 2020-04-28 NOTE — Telephone Encounter (Signed)
Per Dr Mosetta Putt John [name] should have abdominal u/s in 2 weeks.  I left vm John [name] to see if anyone has tried to contact him to schedule and to return my call.

## 2020-05-24 ENCOUNTER — Other Ambulatory Visit: Payer: Self-pay

## 2020-05-24 ENCOUNTER — Ambulatory Visit (HOSPITAL_COMMUNITY)
Admission: RE | Admit: 2020-05-24 | Discharge: 2020-05-24 | Disposition: A | Discharge status: Home / Self Care | Payer: Medicaid Other | Source: Ambulatory Visit | Attending: Hematology | Admitting: Hematology

## 2020-05-24 DIAGNOSIS — K7469 Other cirrhosis of liver: Secondary | ICD-10-CM | POA: Diagnosis not present

## 2020-05-30 ENCOUNTER — Telehealth: Payer: Self-pay

## 2020-05-30 NOTE — Telephone Encounter (Signed)
-----   Message from Malachy Mood, MD sent at 05/28/2020  6:43 PM EST ----- Please let pt know the [place] result. 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   Malachy Mood

## 2020-05-30 NOTE — Telephone Encounter (Signed)
I left vm requesting return call.

## 2020-05-30 NOTE — Telephone Encounter (Signed)
John [name] returned my call. I relayed Dr Latanya Maudlin comments and recommendations.  He verbalized understanding.  He asked when he soul follow up for this.  I told him I will have to get back to him when Dr Mosetta Putt returns to the office.  HE verbalized understanding

## 2020-06-22 ENCOUNTER — Other Ambulatory Visit: Payer: Self-pay

## 2020-06-22 ENCOUNTER — Ambulatory Visit (INDEPENDENT_AMBULATORY_CARE_PROVIDER_SITE_OTHER): Payer: Medicaid Other | Admitting: Podiatry

## 2020-06-22 ENCOUNTER — Encounter: Payer: Self-pay | Admitting: Podiatry

## 2020-06-22 DIAGNOSIS — L84 Corns and callosities: Secondary | ICD-10-CM

## 2020-06-22 DIAGNOSIS — M2041 Other hammer toe(s) (acquired), right foot: Secondary | ICD-10-CM

## 2020-06-22 DIAGNOSIS — E1142 Type 2 diabetes mellitus with diabetic polyneuropathy: Secondary | ICD-10-CM

## 2020-06-22 DIAGNOSIS — B351 Tinea unguium: Secondary | ICD-10-CM

## 2020-06-22 DIAGNOSIS — M2042 Other hammer toe(s) (acquired), left foot: Secondary | ICD-10-CM

## 2020-06-22 DIAGNOSIS — M79676 Pain in unspecified toe(s): Secondary | ICD-10-CM | POA: Diagnosis not present

## 2020-06-22 NOTE — Progress Notes (Signed)
Subjective:  Patient ID: John [name], male    DOB: [birth year],  MRN: [id number]  65 y.o. male presents with preventative diabetic foot care and callus(es) b/l feet and painful thick toenails that are difficult to trim. Painful toenails interfere with ambulation. Aggravating factors include wearing enclosed shoe gear. Pain is relieved with periodic professional debridement. Painful calluses are aggravated when weightbearing with and without shoegear. Pain is relieved with periodic professional debridement..    Patient's blood sugar was 135 mg/dl this morning.  PCP: Elizabeth Palau, FNP and last visit was: 05/25/2020.  Review of Systems: Negative except as noted in the HPI.  Past Medical History:  Diagnosis Date  . Anxiety   . Diabetes (HCC)   . Foot pain left   to have MRI tues.   . Hepatitis C   . High cholesterol    Past Surgical History:  Procedure Laterality Date  . None     Patient Active Problem List   Diagnosis Date Noted  . Uncontrolled type 1 diabetes mellitus with hyperglycemia (HCC) 03/24/2020  . Chronic motor tic 04/28/2018  . Vertigo 02/25/2018  . Chronic right hip pain 12/11/2016  . Liver cirrhosis (HCC) 11/10/2015  . Adjustment disorder with mixed anxiety and depressed mood 06/01/2015  . Secondary esophageal varices without bleeding (HCC) 12/15/2014  . Chronic dental infection 12/11/2014  . Thrombocytopenia (HCC) 11/11/2014  . Anatomical narrow angle, bilateral 12/20/2013  . Tic 09/28/2013  . Tremors of nervous system 07/01/2013  . 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  . Diabetes (HCC)   . High cholesterol   . Anxiety   . Hepatitis C   . Skin pustule 12/01/2012  . Cramps, extremity 10/17/2012  . Vitamin D insufficiency 05/14/2012  . Hematochezia 03/17/2012  . Male hypogonadism 08/20/2011  . Diabetic neuropathy (HCC) 08/14/2011  . Onychomycosis due to dermatophyte 08/14/2011  . Pancytopenia (HCC) 06/06/2011  . Depression 03/19/2011    Current Outpatient  Medications:  .  gabapentin (NEURONTIN) 100 MG capsule, Take 1 capsule (100 mg total) by mouth 3 (three) times daily., Disp: 270 capsule, Rfl: 3 .  gabapentin (NEURONTIN) 300 MG capsule, Take 1 capsule (300 mg total) by mouth at bedtime., Disp: 90 capsule, Rfl: 3 .  glucagon 1 MG injection, Inject 1 mg into the vein once as needed., Disp: , Rfl:  .  insulin aspart (NOVOLOG) 100 UNIT/ML injection, Inject 0-20 Units into the skin 3 (three) times daily before meals. Sliding scale, Disp: , Rfl:  .  insulin glargine (LANTUS) 100 UNIT/ML injection, Inject 50 Units into the skin daily. , Disp: , Rfl:  .  Insulin Pen Needle (B-D ULTRAFINE III SHORT PEN) 31G X 8 MM MISC, Injects 4 times daily, Disp: , Rfl:  .  ketoconazole (NIZORAL) 2 % cream, Apply 1 application topically daily. (Patient not taking: Reported on 01/26/2020), Disp: 60 g, Rfl: 2 .  propranolol (INDERAL) 40 MG tablet, Take 40 mg by mouth 2 (two) times daily. , Disp: , Rfl:  Allergies  Allergen Reactions  . Sulfa Antibiotics Swelling    Of tongue  . Sulfamethoxazole Swelling  . Testosterone Rash and Dermatitis   Social History   Tobacco Use  Smoking Status Never Smoker  Smokeless Tobacco Never Used    Objective:  There were no vitals filed for this visit. Constitutional Patient is a pleasant 65 y.o. Caucasian male WD, WN in NAD. AAO x 3.  Vascular Capillary refill time to digits immediate b/l. Palpable pedal pulses b/l LE. Pedal hair sparse. Lower extremity skin  temperature gradient within normal limits. No ischemia or gangrene noted b/l lower extremities. No cyanosis or clubbing noted.  Neurologic Normal speech. Protective sensation intact 5/5 intact bilaterally with 10g monofilament b/l. Vibratory sensation intact b/l.  Dermatologic Pedal skin with normal turgor, texture and tone bilaterally. No open wounds bilaterally. No interdigital macerations bilaterally. Toenails 1-5 b/l elongated, discolored, dystrophic, thickened, crumbly  with subungual debris and tenderness to dorsal palpation. Hyperkeratotic lesion(s) submet head 5 left foot and submet head 5 right foot.  No erythema, no edema, no drainage, no fluctuance.  Orthopedic: Normal muscle strength 5/5 to all lower extremity muscle groups bilaterally. No pain crepitus or joint limitation noted with ROM b/l. Plantarflexed metatarsal(s) 5th metatarsal head [id number]}. Hammertoes noted to the 2-5 bilaterally.   No flowsheet data found.     Assessment:   1. Pain due to onychomycosis of toenail   2. Acquired hammertoes of both feet   3. Callus   4. Diabetic peripheral neuropathy associated with type 2 diabetes mellitus (HCC)    Plan:  Patient was evaluated and treated and all questions answered.  Onychomycosis with pain -Nails palliatively debridement as below. -Educated on self-care  Procedure: Nail Debridement Rationale: Pain Type of Debridement: manual, sharp debridement. Instrumentation: Nail nipper, rotary burr. Number of Nails: 10  -Examined patient. -Medicaid ABN signed for this year. Patient consents for services of paring of calluses submetatarsal head 5 b/l today. Copy given to patient on today's visit and copy placed in patient chart. -Continue diabetic foot care principles. -Patient to continue soft, supportive shoe gear daily. -Toenails 1-5 b/l were debrided in length and girth with sterile nail nippers and dremel without iatrogenic bleeding.  -Patient to report any pedal injuries to medical professional immediately. -Patient/POA to call should there be question/concern in the interim.  Return in about 3 months (around 09/20/2020) for diabetic foot care.  Freddie Breech, [name]

## 2020-07-21 ENCOUNTER — Other Ambulatory Visit: Payer: Self-pay

## 2020-07-21 ENCOUNTER — Inpatient Hospital Stay: Payer: Medicaid Other

## 2020-07-21 ENCOUNTER — Inpatient Hospital Stay: Payer: Medicaid Other | Attending: Hematology

## 2020-07-21 DIAGNOSIS — K7469 Other cirrhosis of liver: Secondary | ICD-10-CM

## 2020-07-21 LAB — CMP (CANCER CENTER ONLY)
ALT: 89 U/L — ABNORMAL HIGH (ref 0–44)
AST: 129 U/L — ABNORMAL HIGH (ref 15–41)
Albumin: 2.6 g/dL — ABNORMAL LOW (ref 3.5–5.0)
Alkaline Phosphatase: 450 U/L — ABNORMAL HIGH (ref 38–126)
Anion gap: 7 (ref 5–15)
BUN: 12 mg/dL (ref 8–23)
CO2: 24 mmol/L (ref 22–32)
Calcium: 8.8 mg/dL — ABNORMAL LOW (ref 8.9–10.3)
Chloride: 104 mmol/L (ref 98–111)
Creatinine: 0.88 mg/dL (ref 0.61–1.24)
GFR, Estimated: >60 mL/min (ref >60)
Glucose, Bld: 200 mg/dL — ABNORMAL HIGH (ref 70–99)
Potassium: 4.4 mmol/L (ref 3.5–5.1)
Sodium: 135 mmol/L (ref 135–145)
Total Bilirubin: 0.6 mg/dL (ref 0.3–1.2)
Total Protein: 7.1 g/dL (ref 6.5–8.1)

## 2020-07-21 LAB — CBC WITH DIFFERENTIAL/PLATELET
Abs Immature Granulocytes: 0.06 10*3/uL (ref 0.00–0.07)
Basophils Absolute: 0 10*3/uL (ref 0.0–0.1)
Basophils Relative: 0 %
Eosinophils Absolute: 0.3 10*3/uL (ref 0.0–0.5)
Eosinophils Relative: 6 %
HCT: 39.6 % (ref 39.0–52.0)
Hemoglobin: 13.5 g/dL (ref 13.0–17.0)
Immature Granulocytes: 1 %
Lymphocytes Relative: 18 %
Lymphs Abs: 1 10*3/uL (ref 0.7–4.0)
MCH: 30.2 pg (ref 26.0–34.0)
MCHC: 34.1 g/dL (ref 30.0–36.0)
MCV: 88.6 fL (ref 80.0–100.0)
Monocytes Absolute: 0.6 10*3/uL (ref 0.1–1.0)
Monocytes Relative: 12 %
Neutro Abs: 3.5 10*3/uL (ref 1.7–7.7)
Neutrophils Relative %: 63 %
Platelets: 151 10*3/uL (ref 150–400)
RBC: 4.47 MIL/uL (ref 4.22–5.81)
RDW: 15.5 % (ref 11.5–15.5)
WBC: 5.5 10*3/uL (ref 4.0–10.5)
nRBC: 0 % (ref 0.0–0.2)

## 2020-07-21 LAB — IRON AND TIBC
Iron: 105 ug/dL (ref 42–163)
Saturation Ratios: 34 % (ref 20–55)
TIBC: 309 ug/dL (ref 202–409)
UIBC: 203 ug/dL (ref 117–376)

## 2020-07-21 LAB — FERRITIN: Ferritin: 89 ng/mL (ref 24–336)

## 2020-07-21 NOTE — Patient Instructions (Signed)

## 2020-07-22 LAB — AFP TUMOR MARKER: AFP, Serum, Tumor Marker: 3.9 ng/mL (ref 0.0–8.3)

## 2020-09-16 ENCOUNTER — Ambulatory Visit: Payer: Medicaid Other | Admitting: Podiatry

## 2020-10-05 ENCOUNTER — Other Ambulatory Visit: Payer: Self-pay | Admitting: Neurology

## 2020-10-18 ENCOUNTER — Inpatient Hospital Stay: Payer: Medicaid Other

## 2020-10-18 ENCOUNTER — Inpatient Hospital Stay: Payer: Medicaid Other | Attending: Hematology

## 2020-10-18 ENCOUNTER — Other Ambulatory Visit: Payer: Self-pay

## 2020-10-18 LAB — CBC WITH DIFFERENTIAL/PLATELET
Abs Immature Granulocytes: 0.03 10*3/uL (ref 0.00–0.07)
Basophils Absolute: 0 10*3/uL (ref 0.0–0.1)
Basophils Relative: 1 %
Eosinophils Absolute: 0.2 10*3/uL (ref 0.0–0.5)
Eosinophils Relative: 7 %
HCT: 37.4 % — ABNORMAL LOW (ref 39.0–52.0)
Hemoglobin: 12.9 g/dL — ABNORMAL LOW (ref 13.0–17.0)
Immature Granulocytes: 1 %
Lymphocytes Relative: 20 %
Lymphs Abs: 0.6 10*3/uL — ABNORMAL LOW (ref 0.7–4.0)
MCH: 30.4 pg (ref 26.0–34.0)
MCHC: 34.5 g/dL (ref 30.0–36.0)
MCV: 88 fL (ref 80.0–100.0)
Monocytes Absolute: 0.4 10*3/uL (ref 0.1–1.0)
Monocytes Relative: 13 %
Neutro Abs: 1.9 10*3/uL (ref 1.7–7.7)
Neutrophils Relative %: 58 %
Platelets: 116 10*3/uL — ABNORMAL LOW (ref 150–400)
RBC: 4.25 MIL/uL (ref 4.22–5.81)
RDW: 14.4 % (ref 11.5–15.5)
WBC: 3.2 10*3/uL — ABNORMAL LOW (ref 4.0–10.5)
nRBC: 0 % (ref 0.0–0.2)

## 2020-10-18 LAB — IRON AND TIBC
Iron: 46 ug/dL (ref 42–163)
Saturation Ratios: 12 % — ABNORMAL LOW (ref 20–55)
TIBC: 378 ug/dL (ref 202–409)
UIBC: 331 ug/dL (ref 117–376)

## 2020-10-18 LAB — FERRITIN: Ferritin: 35 ng/mL (ref 24–336)

## 2020-10-18 NOTE — Progress Notes (Signed)
John [name] presents today for phlebotomy per MD orders. Okay to proceed per Dr. Al Pimple. Phlebotomy procedure started at 0938 and ended at 0943. 526 grams removed via 16 G to LAC. IV needle removed intact. Patient provided with food and drink.  Patient observed for 30 minutes after procedure without any incident. VSS retaken and remained stable. Patient tolerated procedure well.

## 2020-10-18 NOTE — Patient Instructions (Signed)
Therapeutic Phlebotomy Therapeutic phlebotomy is the planned removal of blood from a person's body for the purpose of treating a medical condition. The procedure is similar to donating blood. Usually, about a pint (470 mL, or 0.47 L) of blood is removed. The average adult has 9-12 pints (4.3-5.7 L) of blood in the body. Therapeutic phlebotomy may be used to treat the following medical conditions:  Hemochromatosis. This is a condition in which the blood contains too much iron.  Polycythemia vera. This is a condition in which the blood contains too many red blood cells.  Porphyria cutanea tarda. This is a disease in which an important part of hemoglobin is not made properly. It results in the buildup of abnormal amounts of porphyrins in the body.  Sickle cell disease. This is a condition in which the red blood cells form an abnormal crescent shape rather than a round shape. Tell a health care provider about:  Any allergies you have.  All medicines you are taking, including vitamins, herbs, eye drops, creams, and over-the-counter medicines.  Any problems you or family members have had with anesthetic medicines.  Any blood disorders you have.  Any surgeries you have had.  Any medical conditions you have.  Whether you are pregnant or may be pregnant. What are the risks? Generally, this is a safe procedure. However, problems may occur, including:  Nausea or light-headedness.  Low blood pressure (hypotension).  Soreness, bleeding, swelling, or bruising at the needle insertion site.  Infection. What happens before the procedure?  Follow instructions from your health care provider about eating or drinking restrictions.  Ask your health care provider about: ? Changing or stopping your regular medicines. This is especially important if you are taking diabetes medicines or blood thinners (anticoagulants). ? Taking medicines such as aspirin and ibuprofen. These medicines can thin your  blood. Do not take these medicines unless your health care provider tells you to take them. ? Taking over-the-counter medicines, vitamins, herbs, and supplements.  Wear clothing with sleeves that can be raised above the elbow.  Plan to have someone take you home from the hospital or clinic.  You may have a blood sample taken.  Your blood pressure, pulse rate, and breathing rate will be measured. What happens during the procedure?  To lower your risk of infection: ? Your health care team will wash or sanitize their hands. ? Your skin will be cleaned with an antiseptic.  You may be given a medicine to numb the area (local anesthetic).  A tourniquet will be placed on your arm.  A needle will be inserted into one of your veins.  Tubing and a collection bag will be attached to that needle.  Blood will flow through the needle and tubing into the collection bag.  The collection bag will be placed lower than your arm to allow gravity to help the flow of blood into the bag.  You may be asked to open and close your hand slowly and continually during the entire collection.  After the specified amount of blood has been removed from your body, the collection bag and tubing will be clamped.  The needle will be removed from your vein.  Pressure will be held on the site of the needle insertion to stop the bleeding.  A bandage (dressing) will be placed over the needle insertion site. The procedure may vary among health care providers and hospitals.   What happens after the procedure?  Your blood pressure, pulse rate, and breathing rate will   be measured after the procedure.  You will be encouraged to drink fluids.  Your recovery will be assessed and monitored.  You can return to your normal activities as told by your health care provider. Summary  Therapeutic phlebotomy is the planned removal of blood from a person's body for the purpose of treating a medical condition.  Therapeutic  phlebotomy may be used to treat hemochromatosis, polycythemia vera, porphyria cutanea tarda, or sickle cell disease.  In the procedure, a needle is inserted and about a pint (470 mL, or 0.47 L) of blood is removed. The average adult has 9-12 pints (4.3-5.7 L) of blood in the body.  This is generally a safe procedure, but it can sometimes cause problems such as nausea, light-headedness, or low blood pressure (hypotension). This information is not intended to replace advice given to you by your health care provider. Make sure you discuss any questions you have with your health care provider. Document Revised: 06/13/2017 Document Reviewed: 06/13/2017 Elsevier Patient Education  2021 Elsevier Inc.  

## 2020-10-19 ENCOUNTER — Ambulatory Visit (HOSPITAL_COMMUNITY)
Admission: RE | Admit: 2020-10-19 | Discharge: 2020-10-19 | Disposition: A | Discharge status: Home / Self Care | Payer: Medicaid Other | Source: Ambulatory Visit | Attending: Hematology | Admitting: Hematology

## 2020-10-19 DIAGNOSIS — K7469 Other cirrhosis of liver: Secondary | ICD-10-CM | POA: Diagnosis present

## 2020-10-25 ENCOUNTER — Encounter: Payer: Self-pay | Admitting: Hematology

## 2021-01-11 ENCOUNTER — Other Ambulatory Visit: Payer: Self-pay | Admitting: Neurology

## 2021-01-18 ENCOUNTER — Other Ambulatory Visit: Payer: Self-pay

## 2021-01-18 ENCOUNTER — Inpatient Hospital Stay: Payer: Medicaid Other

## 2021-01-18 ENCOUNTER — Encounter: Payer: Self-pay | Admitting: Internal Medicine

## 2021-01-18 ENCOUNTER — Inpatient Hospital Stay: Payer: Medicaid Other | Attending: Hematology

## 2021-01-18 DIAGNOSIS — K7469 Other cirrhosis of liver: Secondary | ICD-10-CM

## 2021-01-18 LAB — CBC WITH DIFFERENTIAL/PLATELET
Abs Immature Granulocytes: 0.04 10*3/uL (ref 0.00–0.07)
Basophils Absolute: 0 10*3/uL (ref 0.0–0.1)
Basophils Relative: 0 %
Eosinophils Absolute: 0.5 10*3/uL (ref 0.0–0.5)
Eosinophils Relative: 10 %
HCT: 41.8 % (ref 39.0–52.0)
Hemoglobin: 14.3 g/dL (ref 13.0–17.0)
Immature Granulocytes: 1 %
Lymphocytes Relative: 21 %
Lymphs Abs: 1.1 10*3/uL (ref 0.7–4.0)
MCH: 30 pg (ref 26.0–34.0)
MCHC: 34.2 g/dL (ref 30.0–36.0)
MCV: 87.6 fL (ref 80.0–100.0)
Monocytes Absolute: 0.7 10*3/uL (ref 0.1–1.0)
Monocytes Relative: 13 %
Neutro Abs: 2.8 10*3/uL (ref 1.7–7.7)
Neutrophils Relative %: 55 %
Platelets: 170 10*3/uL (ref 150–400)
RBC: 4.77 MIL/uL (ref 4.22–5.81)
RDW: 16.3 % — ABNORMAL HIGH (ref 11.5–15.5)
WBC: 5.1 10*3/uL (ref 4.0–10.5)
nRBC: 0 % (ref 0.0–0.2)

## 2021-01-18 LAB — CMP (CANCER CENTER ONLY)
ALT: 38 U/L (ref 0–44)
AST: 66 U/L — ABNORMAL HIGH (ref 15–41)
Albumin: 2.7 g/dL — ABNORMAL LOW (ref 3.5–5.0)
Alkaline Phosphatase: 229 U/L — ABNORMAL HIGH (ref 38–126)
Anion gap: 8 (ref 5–15)
BUN: 9 mg/dL (ref 8–23)
CO2: 23 mmol/L (ref 22–32)
Calcium: 8.6 mg/dL — ABNORMAL LOW (ref 8.9–10.3)
Chloride: 107 mmol/L (ref 98–111)
Creatinine: 0.79 mg/dL (ref 0.61–1.24)
GFR, Estimated: >60 mL/min (ref >60)
Glucose, Bld: 125 mg/dL — ABNORMAL HIGH (ref 70–99)
Potassium: 4.2 mmol/L (ref 3.5–5.1)
Sodium: 138 mmol/L (ref 135–145)
Total Bilirubin: 0.7 mg/dL (ref 0.3–1.2)
Total Protein: 7 g/dL (ref 6.5–8.1)

## 2021-01-18 LAB — IRON AND TIBC
Iron: 119 ug/dL (ref 42–163)
Saturation Ratios: 32 % (ref 20–55)
TIBC: 374 ug/dL (ref 202–409)
UIBC: 255 ug/dL (ref 117–376)

## 2021-01-18 LAB — FERRITIN: Ferritin: 32 ng/mL (ref 24–336)

## 2021-01-18 NOTE — Progress Notes (Signed)
Per Dr. Mosetta Putt, patient does not need phlebotomy today according to last ferritin level.

## 2021-01-19 LAB — AFP TUMOR MARKER: AFP, Serum, Tumor Marker: 3.4 ng/mL (ref 0.0–8.4)

## 2021-04-19 ENCOUNTER — Other Ambulatory Visit: Payer: Self-pay

## 2021-04-19 ENCOUNTER — Ambulatory Visit (HOSPITAL_COMMUNITY): Payer: Medicaid Other | Attending: Hematology

## 2021-04-19 NOTE — Progress Notes (Signed)
CBC w/Diff, Ferritin, & Iron/TIBC ordered.

## 2021-04-20 ENCOUNTER — Ambulatory Visit: Payer: Medicaid Other

## 2021-04-20 ENCOUNTER — Other Ambulatory Visit: Payer: Self-pay

## 2021-04-20 ENCOUNTER — Inpatient Hospital Stay: Payer: Medicaid Other

## 2021-04-20 ENCOUNTER — Inpatient Hospital Stay: Payer: Medicaid Other | Attending: Hematology | Admitting: Hematology

## 2021-04-20 DIAGNOSIS — E119 Type 2 diabetes mellitus without complications: Secondary | ICD-10-CM | POA: Diagnosis not present

## 2021-04-20 DIAGNOSIS — B182 Chronic viral hepatitis C: Secondary | ICD-10-CM | POA: Diagnosis not present

## 2021-04-20 DIAGNOSIS — K7469 Other cirrhosis of liver: Secondary | ICD-10-CM

## 2021-04-20 DIAGNOSIS — D696 Thrombocytopenia, unspecified: Secondary | ICD-10-CM | POA: Diagnosis not present

## 2021-04-20 DIAGNOSIS — R42 Dizziness and giddiness: Secondary | ICD-10-CM | POA: Diagnosis not present

## 2021-04-20 LAB — IRON AND TIBC
Iron: 106 ug/dL (ref 42–163)
Saturation Ratios: 32 % (ref 20–55)
TIBC: 329 ug/dL (ref 202–409)
UIBC: 223 ug/dL (ref 117–376)

## 2021-04-20 LAB — CBC WITH DIFFERENTIAL (CANCER CENTER ONLY)
Abs Immature Granulocytes: 0.02 10*3/uL (ref 0.00–0.07)
Basophils Absolute: 0 10*3/uL (ref 0.0–0.1)
Basophils Relative: 1 %
Eosinophils Absolute: 0.3 10*3/uL (ref 0.0–0.5)
Eosinophils Relative: 7 %
HCT: 41.4 % (ref 39.0–52.0)
Hemoglobin: 14.3 g/dL (ref 13.0–17.0)
Immature Granulocytes: 1 %
Lymphocytes Relative: 19 %
Lymphs Abs: 0.7 10*3/uL (ref 0.7–4.0)
MCH: 31.4 pg (ref 26.0–34.0)
MCHC: 34.5 g/dL (ref 30.0–36.0)
MCV: 91 fL (ref 80.0–100.0)
Monocytes Absolute: 0.4 10*3/uL (ref 0.1–1.0)
Monocytes Relative: 11 %
Neutro Abs: 2.4 10*3/uL (ref 1.7–7.7)
Neutrophils Relative %: 61 %
Platelet Count: 122 10*3/uL — ABNORMAL LOW (ref 150–400)
RBC: 4.55 MIL/uL (ref 4.22–5.81)
RDW: 15.4 % (ref 11.5–15.5)
WBC Count: 3.8 10*3/uL — ABNORMAL LOW (ref 4.0–10.5)
nRBC: 0 % (ref 0.0–0.2)

## 2021-04-20 LAB — FERRITIN: Ferritin: 52 ng/mL (ref 24–336)

## 2021-04-20 NOTE — Progress Notes (Signed)
[name]   [phone] [phone]   Clinic Follow up Note   Patient Care Team: Vicenta Aly, [place] as PCP - General (Nurse Practitioner) Percell Belt, MD as Referring Physician (Internal Medicine)  Date of Service:  04/20/2021  CHIEF COMPLAINT: f/u of hemochromatosis   CURRENT THERAPY:  Phlebotomy to maintain a ferritin less than 50 or transferrin saturation about 50% , and hold phlebotomy if hemoglobin is less than 12. Last phlebotomy on 10/18/20.  ASSESSMENT & PLAN:  John [name] is a 65 y.o. male with   1. Hereditary hemochromatosis.  -He has been compliant with lab and phlebotomy as needed every 2-3 months, but did miss a few appointments in 2018 -He had experienced 1 episode of syncope during phlebotomy, but overall tolerating well. -He is clinically doing well, last phlebotomy in 10/2020. Labs reviewed, CBC WNL except plt 122K, WBC 3.8. Iron panel still pending. He does not need phlebotomy today.  -Continue lab with phlebotomy every 3 months as needed -F/u in 1 year.   2. 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  -Continue nadolol. He was counseled to report any bleeding.  -His HCV level is non-detected (labs in [place]) with normalization of his transiminases.  -he continues [facility] surveillance with ultrasound and AFP every 6 months. Last abd [place] 10/19/20 was stable. He accidentally missed his [place] yesterday, we will reschedule. -Continue to f/u with Dr. Liz Malady    3. Thrombocytopenia  -His thrombocytopenia is likely secondary to liver cirrhosis and splenomegaly, no signs of bleeding.  -Thrombocytopenia intermittent now.    4. DM2 secondary #1 -Continue insulin sliding scale and ketoconazole -Continue to follow-up with PCP for better management.    5. History of CHF -He is asymptomatic, clinically no signs of CHF. Stable.    6. Intermittent Vertigo  -F/u with Neurologist Dr. Krista Blue. He is  also on Neurontin    Plan  -Labs reviewed, no need for phlebotomy today  -US abdomen next week, in 6 months and one week before next visit  -AFP every 6 months -Lab every 3 months with phlebotomy X4 (hold if ferritin less than 50 and transferrin saturation < 50% on last lab) -F/u in 1 year.    No problem-specific Assessment & Plan notes found for this encounter.   INTERVAL HISTORY:  John [name] is here for a follow up of hemochromatosis. He was last seen by me a year ago. He presents to the clinic alone. He reports he is doing well overall. He notes he had colonoscopy earlier this year.   All other systems were reviewed with the patient and are negative.  MEDICAL HISTORY:  Past Medical History:  Diagnosis Date   Anxiety    Diabetes ([place])    Foot pain left   to have MRI tues.    Hepatitis C    High cholesterol     SURGICAL HISTORY: Past Surgical History:  Procedure Laterality Date   None      I have reviewed the social history and family history with the patient and they are unchanged from previous note.  ALLERGIES:  is allergic to sulfa antibiotics, sulfamethoxazole, and testosterone.  MEDICATIONS:  Current Outpatient Medications  Medication Sig Dispense Refill   ACCU-CHEK AVIVA PLUS test strip SMARTSIG:1 Strip(s) Via Meter 5 Times Daily     albuterol (VENTOLIN HFA) 108 (90 Base) MCG/ACT inhaler albuterol sulfate HFA 90 mcg/actuation aerosol inhaler  INHALE 2 PUFFS BY MOUTH EVERY 4 HOURS  AS NEEDED FOR WHEEZING     azelastine (ASTELIN) 0.1 % nasal spray azelastine 137 mcg (0.1 %) nasal spray aerosol  USE 2 SPRAY(S) IN EACH NOSTRIL 4 TIMES DAILY FOR 3 5 DAYS . THEN USE TWICE DAILY     Continuous Blood Gluc Receiver (FREESTYLE LIBRE 2 READER) DEVI Scan as needed for continuous glucose monitoring.     Continuous Blood Gluc Sensor (FREESTYLE LIBRE 2 SENSOR) MISC Apply topically.     gabapentin (NEURONTIN) 100 MG capsule Take 1 capsule (100 mg total) by mouth 3  (three) times daily. 270 capsule 3   gabapentin (NEURONTIN) 300 MG capsule Take 1 capsule (300 mg total) by mouth at bedtime. 90 capsule 3   glucagon 1 MG injection Inject 1 mg into the vein once as needed.     insulin aspart (NOVOLOG) 100 UNIT/ML injection Inject 0-20 Units into the skin 3 (three) times daily before meals. Sliding scale     insulin glargine (LANTUS) 100 UNIT/ML injection Inject 30 Units into the skin daily.     Insulin Pen Needle 31G X 8 MM MISC Injects 4 times daily     ketoconazole (NIZORAL) 2 % cream Apply 1 application topically daily. 60 g 2   lactulose (CHRONULAC) 10 GM/15ML solution Constulose 10 gram/15 mL oral solution  TAKE 30 ML BY MOUTH ONCE DAILY. ADJUST DOSE TO CONTINUE HAVING 3 4 BOWEL MOVEMENTS DAILY     propranolol (INDERAL) 40 MG tablet Take 40 mg by mouth 2 (two) times daily.      rosuvastatin (CRESTOR) 5 MG tablet Take 1 pill three nights week at bedtime.     No current facility-administered medications for this visit.    PHYSICAL EXAMINATION: ECOG PERFORMANCE STATUS: 0 - Asymptomatic  Vitals:   04/20/21 1303  BP: (!) 146/67  Pulse: 78  Resp: 20  Temp: 97.7 F (36.5 C)  SpO2: 98%   Wt Readings from Last 3 Encounters:  04/20/21 171 lb 14.4 oz (78 kg)  04/20/20 180 lb (81.6 kg)  04/20/19 184 lb 14.4 oz (83.9 kg)     GENERAL:alert, no distress and comfortable SKIN: skin color normal, no rashes or significant lesions EYES: normal, Conjunctiva are pink and non-injected, sclera clear  NEURO: alert & oriented x 3 with fluent speech  LABORATORY DATA:  I have reviewed the data as listed CBC Latest Ref Rng & Units 04/20/2021 01/18/2021 10/18/2020  WBC 4.0 - 10.5 K/uL 3.8(L) 5.1 3.2(L)  Hemoglobin 13.0 - 17.0 g/dL 14.3 14.3 12.9(L)  Hematocrit 39.0 - 52.0 % 41.4 41.8 37.4(L)  Platelets 150 - 400 K/uL 122(L) 170 116(L)     CMP Latest Ref Rng & Units 01/18/2021 07/21/2020 05/13/2014  Glucose 70 - 99 mg/dL 125(H) 200(H) 177(H)  BUN 8 - 23 mg/dL 9  12 10.8  Creatinine 0.61 - 1.24 mg/dL 0.79 0.88 0.8  Sodium 135 - 145 mmol/L 138 135 138  Potassium 3.5 - 5.1 mmol/L 4.2 4.4 4.0  Chloride 98 - 111 mmol/L 107 104 -  CO2 22 - 32 mmol/L 23 24 29   Calcium 8.9 - 10.3 mg/dL 8.6(L) 8.8(L) 9.1  Total Protein 6.5 - 8.1 g/dL 7.0 7.1 7.5  Total Bilirubin 0.3 - 1.2 mg/dL 0.7 0.6 0.41  Alkaline Phos 38 - 126 U/L 229(H) 450(H) 142  AST 15 - 41 U/L 66(H) 129(H) 52(H)  ALT 0 - 44 U/L 38 89(H) 41      RADIOGRAPHIC STUDIES: I have personally reviewed the radiological images as listed and agreed with  the findings in the report. No results found.    Orders Placed This Encounter  Procedures   US Abdomen Complete    Standing Status:   Future    Standing Expiration Date:   04/20/2022    Order Specific Question:   Reason for Exam (SYMPTOM  OR DIAGNOSIS REQUIRED)    Answer:   liver cancer screening    Order Specific Question:   Preferred imaging location?    Answer:   [facility]   US Abdomen Complete    Standing Status:   Future    Standing Expiration Date:   04/20/2022    Order Specific Question:   Reason for Exam (SYMPTOM  OR DIAGNOSIS REQUIRED)    Answer:   liver cancer screening    Order Specific Question:   Preferred imaging location?    Answer:   [facility]    All questions were answered. The patient knows to call the clinic with any problems, questions or concerns. No barriers to learning was detected. The total time spent in the appointment was 25 minutes.     Malachy Mood, MD 04/20/2021   I, Mickie Bail, am acting as scribe for Malachy Mood, MD.   I have reviewed the above documentation for accuracy and completeness, and I agree with the above.

## 2021-04-24 ENCOUNTER — Telehealth: Payer: Self-pay | Admitting: Hematology

## 2021-04-24 NOTE — Telephone Encounter (Signed)
Left message with follow-up appointments per 11/10 los. 

## 2021-04-27 ENCOUNTER — Ambulatory Visit (HOSPITAL_COMMUNITY)
Admission: RE | Admit: 2021-04-27 | Discharge: 2021-04-27 | Disposition: A | Discharge status: Home / Self Care | Payer: Medicaid Other | Source: Ambulatory Visit | Attending: Hematology | Admitting: Hematology

## 2021-04-30 ENCOUNTER — Encounter: Payer: Self-pay | Admitting: Hematology

## 2021-05-11 ENCOUNTER — Other Ambulatory Visit: Payer: Medicaid Other

## 2021-06-01 ENCOUNTER — Other Ambulatory Visit: Payer: Medicaid Other

## 2021-06-22 ENCOUNTER — Other Ambulatory Visit: Payer: Medicaid Other

## 2021-07-13 ENCOUNTER — Other Ambulatory Visit: Payer: Medicaid Other

## 2021-07-20 ENCOUNTER — Inpatient Hospital Stay: Payer: Medicaid Other | Attending: Hematology

## 2021-07-20 ENCOUNTER — Inpatient Hospital Stay: Payer: Medicaid Other

## 2021-07-20 DIAGNOSIS — D696 Thrombocytopenia, unspecified: Secondary | ICD-10-CM | POA: Insufficient documentation

## 2021-08-07 ENCOUNTER — Other Ambulatory Visit: Payer: Self-pay

## 2021-08-07 ENCOUNTER — Inpatient Hospital Stay: Payer: Medicaid Other

## 2021-08-07 DIAGNOSIS — D696 Thrombocytopenia, unspecified: Secondary | ICD-10-CM | POA: Diagnosis not present

## 2021-08-07 LAB — CBC WITH DIFFERENTIAL (CANCER CENTER ONLY)
Abs Immature Granulocytes: 0.01 10*3/uL (ref 0.00–0.07)
Basophils Absolute: 0 10*3/uL (ref 0.0–0.1)
Basophils Relative: 0 %
Eosinophils Absolute: 0.2 10*3/uL (ref 0.0–0.5)
Eosinophils Relative: 5 %
HCT: 41.1 % (ref 39.0–52.0)
Hemoglobin: 14.5 g/dL (ref 13.0–17.0)
Immature Granulocytes: 0 %
Lymphocytes Relative: 25 %
Lymphs Abs: 1.1 10*3/uL (ref 0.7–4.0)
MCH: 33 pg (ref 26.0–34.0)
MCHC: 35.3 g/dL (ref 30.0–36.0)
MCV: 93.6 fL (ref 80.0–100.0)
Monocytes Absolute: 0.5 10*3/uL (ref 0.1–1.0)
Monocytes Relative: 12 %
Neutro Abs: 2.6 10*3/uL (ref 1.7–7.7)
Neutrophils Relative %: 58 %
Platelet Count: 125 10*3/uL — ABNORMAL LOW (ref 150–400)
RBC: 4.39 MIL/uL (ref 4.22–5.81)
RDW: 13.6 % (ref 11.5–15.5)
WBC Count: 4.5 10*3/uL (ref 4.0–10.5)
nRBC: 0 % (ref 0.0–0.2)

## 2021-08-07 LAB — FERRITIN: Ferritin: 186 ng/mL (ref 24–336)

## 2021-08-08 ENCOUNTER — Inpatient Hospital Stay: Payer: Medicaid Other

## 2021-08-08 NOTE — Patient Instructions (Signed)
Therapeutic Phlebotomy Therapeutic phlebotomy is the planned removal of blood from a person's body for the purpose of treating a medical condition. The procedure is similar to donating blood. Usually, about a pint (470 mL, or 0.47 L) of blood is removed. The average adult has 9-12 pints (4.3-5.7 L) of blood in the body. Therapeutic phlebotomy may be used to treat the following medical conditions:  Hemochromatosis. This is a condition in which the blood contains too much iron.  Polycythemia vera. This is a condition in which the blood contains too many red blood cells.  Porphyria cutanea tarda. This is a disease in which an important part of hemoglobin is not made properly. It results in the buildup of abnormal amounts of porphyrins in the body.  Sickle cell disease. This is a condition in which the red blood cells form an abnormal crescent shape rather than a round shape. Tell a health care provider about:  Any allergies you have.  All medicines you are taking, including vitamins, herbs, eye drops, creams, and over-the-counter medicines.  Any problems you or family members have had with anesthetic medicines.  Any blood disorders you have.  Any surgeries you have had.  Any medical conditions you have.  Whether you are pregnant or may be pregnant. What are the risks? Generally, this is a safe procedure. However, problems may occur, including:  Nausea or light-headedness.  Low blood pressure (hypotension).  Soreness, bleeding, swelling, or bruising at the needle insertion site.  Infection. What happens before the procedure?  Follow instructions from your health care provider about eating or drinking restrictions.  Ask your health care provider about: ? Changing or stopping your regular medicines. This is especially important if you are taking diabetes medicines or blood thinners (anticoagulants). ? Taking medicines such as aspirin and ibuprofen. These medicines can thin your  blood. Do not take these medicines unless your health care provider tells you to take them. ? Taking over-the-counter medicines, vitamins, herbs, and supplements.  Wear clothing with sleeves that can be raised above the elbow.  Plan to have someone take you home from the hospital or clinic.  You may have a blood sample taken.  Your blood pressure, pulse rate, and breathing rate will be measured. What happens during the procedure?  To lower your risk of infection: ? Your health care team will wash or sanitize their hands. ? Your skin will be cleaned with an antiseptic.  You may be given a medicine to numb the area (local anesthetic).  A tourniquet will be placed on your arm.  A needle will be inserted into one of your veins.  Tubing and a collection bag will be attached to that needle.  Blood will flow through the needle and tubing into the collection bag.  The collection bag will be placed lower than your arm to allow gravity to help the flow of blood into the bag.  You may be asked to open and close your hand slowly and continually during the entire collection.  After the specified amount of blood has been removed from your body, the collection bag and tubing will be clamped.  The needle will be removed from your vein.  Pressure will be held on the site of the needle insertion to stop the bleeding.  A bandage (dressing) will be placed over the needle insertion site. The procedure may vary among health care providers and hospitals.   What happens after the procedure?  Your blood pressure, pulse rate, and breathing rate will   be measured after the procedure.  You will be encouraged to drink fluids.  Your recovery will be assessed and monitored.  You can return to your normal activities as told by your health care provider. Summary  Therapeutic phlebotomy is the planned removal of blood from a person's body for the purpose of treating a medical condition.  Therapeutic  phlebotomy may be used to treat hemochromatosis, polycythemia vera, porphyria cutanea tarda, or sickle cell disease.  In the procedure, a needle is inserted and about a pint (470 mL, or 0.47 L) of blood is removed. The average adult has 9-12 pints (4.3-5.7 L) of blood in the body.  This is generally a safe procedure, but it can sometimes cause problems such as nausea, light-headedness, or low blood pressure (hypotension). This information is not intended to replace advice given to you by your health care provider. Make sure you discuss any questions you have with your health care provider. Document Revised: 06/13/2017 Document Reviewed: 06/13/2017 Elsevier Patient Education  2021 Elsevier Inc.  

## 2021-08-08 NOTE — Progress Notes (Signed)
Therapeutic phlebotomy performed per MD orders. Started at [phone] and ended at 98. 16G phlebotomy kit used on left AC with 532G removed. IV removed, intact, patient given fluids and offered food. Monitored for 30 minute post observation period. VSS, ambulatory to lobby.

## 2021-08-17 ENCOUNTER — Encounter: Payer: Self-pay | Admitting: Hematology

## 2021-10-16 ENCOUNTER — Ambulatory Visit (HOSPITAL_COMMUNITY)
Admission: RE | Admit: 2021-10-16 | Discharge: 2021-10-16 | Disposition: A | Discharge status: Home / Self Care | Payer: Medicaid Other | Source: Ambulatory Visit | Attending: Hematology | Admitting: Hematology

## 2021-10-17 ENCOUNTER — Telehealth: Payer: Self-pay | Admitting: Hematology

## 2021-10-17 NOTE — Telephone Encounter (Signed)
.  Called patient to schedule appointment per 5.9 inbasket, patient is aware of date and time.   ?

## 2021-10-19 ENCOUNTER — Inpatient Hospital Stay: Payer: Medicaid Other

## 2021-10-20 ENCOUNTER — Other Ambulatory Visit: Payer: Self-pay

## 2021-10-20 ENCOUNTER — Inpatient Hospital Stay: Payer: Medicaid Other | Attending: Hematology

## 2021-10-20 ENCOUNTER — Inpatient Hospital Stay: Payer: Medicaid Other

## 2021-10-20 DIAGNOSIS — B1920 Unspecified viral hepatitis C without hepatic coma: Secondary | ICD-10-CM | POA: Insufficient documentation

## 2021-10-20 DIAGNOSIS — D696 Thrombocytopenia, unspecified: Secondary | ICD-10-CM | POA: Insufficient documentation

## 2021-10-20 LAB — CBC WITH DIFFERENTIAL (CANCER CENTER ONLY)
Abs Immature Granulocytes: 0.01 10*3/uL (ref 0.00–0.07)
Basophils Absolute: 0 10*3/uL (ref 0.0–0.1)
Basophils Relative: 1 %
Eosinophils Absolute: 0.4 10*3/uL (ref 0.0–0.5)
Eosinophils Relative: 9 %
HCT: 41.1 % (ref 39.0–52.0)
Hemoglobin: 14.5 g/dL (ref 13.0–17.0)
Immature Granulocytes: 0 %
Lymphocytes Relative: 23 %
Lymphs Abs: 0.9 10*3/uL (ref 0.7–4.0)
MCH: 32.8 pg (ref 26.0–34.0)
MCHC: 35.3 g/dL (ref 30.0–36.0)
MCV: 93 fL (ref 80.0–100.0)
Monocytes Absolute: 0.5 10*3/uL (ref 0.1–1.0)
Monocytes Relative: 14 %
Neutro Abs: 2.1 10*3/uL (ref 1.7–7.7)
Neutrophils Relative %: 53 %
Platelet Count: 112 10*3/uL — ABNORMAL LOW (ref 150–400)
RBC: 4.42 MIL/uL (ref 4.22–5.81)
RDW: 12.4 % (ref 11.5–15.5)
WBC Count: 3.9 10*3/uL — ABNORMAL LOW (ref 4.0–10.5)
nRBC: 0 % (ref 0.0–0.2)

## 2021-10-20 LAB — FERRITIN: Ferritin: 51 ng/mL (ref 24–336)

## 2021-10-20 NOTE — Progress Notes (Signed)
Therapeutic phlebotomy performed per MD orders. Started at [phone] and ended at 0954 using 16G phlebotomy kit to left [facility] with 515G removed. IV removed, intact, patient given fluids and offered food. Monitored for 30 minute post observation period. VSS, ambulatory to lobby.  ?

## 2021-10-20 NOTE — Patient Instructions (Signed)

## 2021-10-30 ENCOUNTER — Encounter: Payer: Self-pay | Admitting: Hematology

## 2021-12-29 ENCOUNTER — Encounter: Payer: Self-pay | Admitting: Podiatry

## 2021-12-29 ENCOUNTER — Ambulatory Visit (INDEPENDENT_AMBULATORY_CARE_PROVIDER_SITE_OTHER): Payer: Medicaid Other | Admitting: Podiatry

## 2021-12-29 DIAGNOSIS — L84 Corns and callosities: Secondary | ICD-10-CM | POA: Diagnosis not present

## 2021-12-29 DIAGNOSIS — E119 Type 2 diabetes mellitus without complications: Secondary | ICD-10-CM

## 2021-12-29 DIAGNOSIS — M216X9 Other acquired deformities of unspecified foot: Secondary | ICD-10-CM

## 2021-12-29 DIAGNOSIS — M2041 Other hammer toe(s) (acquired), right foot: Secondary | ICD-10-CM | POA: Diagnosis not present

## 2021-12-29 DIAGNOSIS — M2042 Other hammer toe(s) (acquired), left foot: Secondary | ICD-10-CM

## 2021-12-29 DIAGNOSIS — E1142 Type 2 diabetes mellitus with diabetic polyneuropathy: Secondary | ICD-10-CM

## 2021-12-29 DIAGNOSIS — M79676 Pain in unspecified toe(s): Secondary | ICD-10-CM

## 2021-12-29 DIAGNOSIS — B351 Tinea unguium: Secondary | ICD-10-CM | POA: Diagnosis not present

## 2021-12-29 NOTE — Patient Instructions (Signed)

## 2022-01-06 NOTE — Progress Notes (Signed)
ANNUAL DIABETIC FOOT EXAM  Subjective: John [name] presents today for annual diabetic foot examination.  Patient confirms h/o diabetes.  Patient relates 64-20year h/o diabetes.  Patient denies any h/o foot wounds.  Patient has been diagnosed with neuropathy and it is managed with gabapentin.  Patient's blood sugar was 155 mg/dl today. Last known  HgA1c was 6.9%   Risk factors: diabetes, diabetic neuropathy, hypercholesterolemia.  Elizabeth Palau, FNP is patient's PCP. Last visit was 05/25/2020.  Past Medical History:  Diagnosis Date   Anxiety    Diabetes (HCC)    Foot pain left   to have MRI tues.    Hepatitis C    High cholesterol    Patient Active Problem List   Diagnosis Date Noted   Uncontrolled type 1 diabetes mellitus with hyperglycemia (HCC) 03/24/2020   Chronic motor tic 04/28/2018   Vertigo 02/25/2018   Chronic right hip pain 12/11/2016   Liver cirrhosis (HCC) 11/10/2015   Adjustment disorder with mixed anxiety and depressed mood 06/01/2015   Secondary esophageal varices without bleeding (HCC) 12/15/2014   Chronic dental infection 12/11/2014   Thrombocytopenia (HCC) 11/11/2014   Anatomical narrow angle, bilateral 12/20/2013   Tic 09/28/2013   Tremors of nervous system 07/01/2013   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   Diabetes (HCC)    High cholesterol    Anxiety    Hepatitis C    Skin pustule 12/01/2012   Cramps, extremity 10/17/2012   Vitamin D insufficiency 05/14/2012   Hematochezia 03/17/2012   Male hypogonadism 08/20/2011   Diabetic neuropathy (HCC) 08/14/2011   Onychomycosis due to dermatophyte 08/14/2011   Pancytopenia (HCC) 06/06/2011   Depression 03/19/2011   Past Surgical History:  Procedure Laterality Date   None     Current Outpatient Medications on File Prior to Visit  Medication Sig Dispense Refill   gabapentin (NEURONTIN) 100 MG capsule Take 1 capsule by mouth 3 (three) times daily.     gabapentin (NEURONTIN) 300 MG capsule Take 1  capsule by mouth at bedtime.     Glucagon 3 MG/DOSE POWD Place into the nose.     lactulose (CHRONULAC) 10 GM/15ML solution as needed.     triamcinolone ointment (KENALOG) 0.5 % Apply topically 2 (two) times daily.     ACCU-CHEK AVIVA PLUS test strip SMARTSIG:1 Strip(s) Via Meter 5 Times Daily     albuterol (VENTOLIN HFA) 108 (90 Base) MCG/ACT inhaler albuterol sulfate HFA 90 mcg/actuation aerosol inhaler  INHALE 2 PUFFS BY MOUTH EVERY 4 HOURS AS NEEDED FOR WHEEZING     azelastine (ASTELIN) 0.1 % nasal spray azelastine 137 mcg (0.1 %) nasal spray aerosol  USE 2 SPRAY(S) IN EACH NOSTRIL 4 TIMES DAILY FOR 3 5 DAYS . THEN USE TWICE DAILY     Continuous Blood Gluc Receiver (FREESTYLE LIBRE 2 READER) DEVI Scan as needed for continuous glucose monitoring.     Continuous Blood Gluc Sensor (FREESTYLE LIBRE 2 SENSOR) MISC Apply topically.     glucagon 1 MG injection Inject 1 mg into the vein once as needed.     insulin aspart (NOVOLOG) 100 unit/mL injection Inject into the skin.     insulin glargine (LANTUS) 100 UNIT/ML injection Inject 30 Units into the skin daily.     Insulin Pen Needle 31G X 8 MM MISC Injects 4 times daily     ketoconazole (NIZORAL) 2 % cream Apply 1 application topically daily. 60 g 2   propranolol (INDERAL) 40 MG tablet Take 40 mg by mouth 2 (two) times daily.  rosuvastatin (CRESTOR) 5 MG tablet Take 1 pill three nights week at bedtime.     No current facility-administered medications on file prior to visit.    Allergies  Allergen Reactions   Sulfa Antibiotics Swelling    Of tongue   Sulfamethoxazole Swelling   Testosterone Rash and Dermatitis   Social History   Occupational History    Comment: disabled  Tobacco Use   Smoking status: Never   Smokeless tobacco: Never  Substance and Sexual Activity   Alcohol use: No    Comment: Quit 2007   Drug use: No    Comment: Quit 1980   Sexual activity: Not on file   History reviewed. No pertinent family  history. Immunization History  Administered Date(s) Administered   DTaP 10/23/2010   Hepatitis A, Ped/Adol-2 Dose 08/13/2010   Hepatitis B, adult 08/16/2010, 11/16/2010, 07/09/2011   Hepatitis B, ped/adol 08/16/2010, 11/16/2010, 07/09/2011   Influenza Split 06/07/2011, 03/17/2012   Influenza, Seasonal, Injecte, Preservative Fre 04/29/2014, 03/17/2015, 08/12/2017   Influenza,inj,Quad PF,6+ Mos 04/29/2014, 03/17/2015   Influenza,inj,Quad PF,6-35 Mos 02/24/2019   Influenza,inj,quad, With Preservative 03/29/2016   Influenza,trivalent, recombinat, inj, PF 08/12/2017   Moderna Sars-Covid-2 Vaccination 07/10/2019, 07/18/2019, 04/13/2020   Pneumococcal Polysaccharide-23 06/07/2011   Tdap 10/23/2010     Review of Systems: Negative except as noted in the HPI.   Objective: There were no vitals filed for this visit.  John [name] is a pleasant 66 y.o. male in NAD.  Constitutional Patient is a pleasant 66 y.o. Caucasian male WD, WN in NAD. AAO x 3.  Vascular Capillary refill time to digits immediate b/l. Palpable pedal pulses b/l LE. Pedal hair sparse. Lower extremity skin temperature gradient within normal limits. No ischemia or gangrene noted b/l lower extremities. No cyanosis or clubbing noted.  Neurologic Normal speech. Protective sensation intact 5/5 intact bilaterally with 10g monofilament b/l. Vibratory sensation intact b/l. Pt has subjective symptoms of neuropathy.  Dermatologic Pedal skin with normal turgor, texture and tone bilaterally. No open wounds bilaterally. No interdigital macerations bilaterally. Toenails 1-5 b/l elongated, discolored, dystrophic, thickened, crumbly with subungual debris and tenderness to dorsal palpation. Hyperkeratotic lesion(s) submet head 5 left foot and submet head 5 right foot.  No erythema, no edema, no drainage, no fluctuance.  Orthopedic: Normal muscle strength 5/5 to all lower extremity muscle groups bilaterally. No pain crepitus or joint limitation  noted with ROM b/l. Plantarflexed metatarsal(s) 5th metatarsal head b/l. Hammertoes noted to the 2-5 bilaterally.   Footwear Assessment: Does the patient wear appropriate shoes? Yes. Does the patient need inserts/orthotics? Yes.  ADA Risk Categorization: Low Risk :  Patient has all of the following: Intact protective sensation No prior foot ulcer  No severe deformity Pedal pulses present  Assessment: 1. Pain due to onychomycosis of toenail   2. Callus   3. Acquired hammertoes of both feet   4. Plantar flexed metatarsal, unspecified laterality   5. Diabetic peripheral neuropathy associated with type 2 diabetes mellitus (HCC)   6. Encounter for diabetic foot exam [facility])     Plan: -Patient was evaluated and treated. All patient's and/or POA's questions/concerns answered on today's visit. -Medicaid ABN signed for services of paring of corn(s)/callus(es)/porokeratos(es) today. Copy in patient chart. -Diabetic foot examination performed today. -Continue foot and shoe inspections daily. Monitor blood glucose per PCP/Endocrinologist's recommendations. -Patient to continue soft, supportive shoe gear daily. -Mycotic toenails 1-5 bilaterally were debrided in length and girth with sterile nail nippers and dremel without incident. -Callus(es) submet head 5 b/l  pared utilizing sterile scalpel blade without complication or incident. Total number debrided =2. -Patient/POA to call should there be question/concern in the interim. Return in about 4 months (around 05/01/2022).  Freddie Breech, [name]

## 2022-01-19 ENCOUNTER — Inpatient Hospital Stay: Payer: Medicaid Other

## 2022-04-20 ENCOUNTER — Inpatient Hospital Stay: Payer: Medicaid Other

## 2022-04-20 ENCOUNTER — Inpatient Hospital Stay: Payer: Medicaid Other | Attending: Hematology | Admitting: Hematology

## 2022-04-20 ENCOUNTER — Encounter: Payer: Self-pay | Admitting: Hematology

## 2022-04-20 DIAGNOSIS — E119 Type 2 diabetes mellitus without complications: Secondary | ICD-10-CM | POA: Insufficient documentation

## 2022-04-20 DIAGNOSIS — D696 Thrombocytopenia, unspecified: Secondary | ICD-10-CM | POA: Diagnosis not present

## 2022-04-20 DIAGNOSIS — B1920 Unspecified viral hepatitis C without hepatic coma: Secondary | ICD-10-CM | POA: Insufficient documentation

## 2022-04-20 DIAGNOSIS — K7469 Other cirrhosis of liver: Secondary | ICD-10-CM | POA: Diagnosis not present

## 2022-04-20 LAB — CBC WITH DIFFERENTIAL/PLATELET
Abs Immature Granulocytes: 0.01 10*3/uL (ref 0.00–0.07)
Basophils Absolute: 0 10*3/uL (ref 0.0–0.1)
Basophils Relative: 1 %
Eosinophils Absolute: 0.3 10*3/uL (ref 0.0–0.5)
Eosinophils Relative: 7 %
HCT: 42.5 % (ref 39.0–52.0)
Hemoglobin: 14.9 g/dL (ref 13.0–17.0)
Immature Granulocytes: 0 %
Lymphocytes Relative: 19 %
Lymphs Abs: 0.8 10*3/uL (ref 0.7–4.0)
MCH: 32.3 pg (ref 26.0–34.0)
MCHC: 35.1 g/dL (ref 30.0–36.0)
MCV: 92.2 fL (ref 80.0–100.0)
Monocytes Absolute: 0.4 10*3/uL (ref 0.1–1.0)
Monocytes Relative: 11 %
Neutro Abs: 2.6 10*3/uL (ref 1.7–7.7)
Neutrophils Relative %: 62 %
Platelets: 123 10*3/uL — ABNORMAL LOW (ref 150–400)
RBC: 4.61 MIL/uL (ref 4.22–5.81)
RDW: 13.5 % (ref 11.5–15.5)
WBC: 4.2 10*3/uL (ref 4.0–10.5)
nRBC: 0 % (ref 0.0–0.2)

## 2022-04-20 LAB — FERRITIN: Ferritin: 64 ng/mL (ref 24–336)

## 2022-04-20 NOTE — Patient Instructions (Signed)

## 2022-04-20 NOTE — Progress Notes (Signed)
[place]   [phone] [phone]   Clinic Follow up Note   Patient Care Team: Default, Provider, MD as PCP - General Percell Belt, MD as Referring Physician (Internal Medicine)  Date of Service:  04/20/2022  CHIEF COMPLAINT: f/u of hemochromatosis    CURRENT THERAPY:  Therapeutic phlebotomy PRN  -Goal: ferritin <50 or transferrin saturation ~50%  -hold if hgb >12  ASSESSMENT & PLAN:  John [name] is a 66 y.o. male with   1. Hereditary hemochromatosis.  -managed by hematology with therapeutic phlebotomies as needed since at least 2008. -last abdomen US 10/16/21 was stable. He is due for repeat; I ordered today. -He is clinically doing well, last phlebotomy in 10/2021. Labs reviewed, CBC WNL except plt stable at 123K. Ferritin is pending. Will proceed with phlebotomy today.  -Continue lab with phlebotomy every 3 months as needed -F/u in 1 year.    2. 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  -Continue to f/u with Dr. Liz Malady  -Continue [place] screening by AFP and abdominal ultrasound every 6 months here   3. Thrombocytopenia  -likely secondary to liver cirrhosis and splenomegaly, no signs of bleeding.  -Thrombocytopenia intermittent now.      Plan  -proceed with phlebotomy today  -US abdomen in 2 weeks and in 6 months -Lab every 3 months with phlebotomy for ferritin <50 and transferrin saturation <50% on last lab -F/u in 1 year.    No problem-specific Assessment & Plan notes found for this encounter.   INTERVAL HISTORY:  John [name] is here for a follow up of hemochromatosis . He was last seen by me one year ago. He presents to the clinic alone. He reports he is doing well overall.  He tells me he is helping care for a friend who had a stroke in 01/2022.   All other systems were reviewed with the patient and are negative.  MEDICAL HISTORY:  Past Medical History:  Diagnosis Date    Anxiety    Diabetes ([place])    Foot pain left   to have MRI tues.    Hepatitis C    High cholesterol     SURGICAL HISTORY: Past Surgical History:  Procedure Laterality Date   None      I have reviewed the social history and family history with the patient and they are unchanged from previous note.  ALLERGIES:  is allergic to sulfa antibiotics, sulfamethoxazole, and testosterone.  MEDICATIONS:  Current Outpatient Medications  Medication Sig Dispense Refill   ACCU-CHEK AVIVA PLUS test strip SMARTSIG:1 Strip(s) Via Meter 5 Times Daily     albuterol (VENTOLIN HFA) 108 (90 Base) MCG/ACT inhaler albuterol sulfate HFA 90 mcg/actuation aerosol inhaler  INHALE 2 PUFFS BY MOUTH EVERY 4 HOURS AS NEEDED FOR WHEEZING     azelastine (ASTELIN) 0.1 % nasal spray azelastine 137 mcg (0.1 %) nasal spray aerosol  USE 2 SPRAY(S) IN EACH NOSTRIL 4 TIMES DAILY FOR 3 5 DAYS . THEN USE TWICE DAILY     Continuous Blood Gluc Receiver (FREESTYLE LIBRE 2 READER) DEVI Scan as needed for continuous glucose monitoring.     Continuous Blood Gluc Sensor (FREESTYLE LIBRE 2 SENSOR) MISC Apply topically.     gabapentin (NEURONTIN) 100 MG capsule Take 1 capsule by mouth 3 (three) times daily.     gabapentin (NEURONTIN) 300 MG capsule Take 1 capsule by mouth at bedtime.     glucagon 1 MG injection Inject 1 mg into  the vein once as needed.     Glucagon 3 MG/DOSE POWD Place into the nose.     insulin aspart (NOVOLOG) 100 unit/mL injection Inject into the skin.     insulin glargine (LANTUS) 100 UNIT/ML injection Inject 30 Units into the skin daily.     Insulin Pen Needle 31G X 8 MM MISC Injects 4 times daily     ketoconazole (NIZORAL) 2 % cream Apply 1 application topically daily. 60 g 2   lactulose (CHRONULAC) 10 GM/15ML solution as needed.     propranolol (INDERAL) 40 MG tablet Take 40 mg by mouth 2 (two) times daily.      rosuvastatin (CRESTOR) 5 MG tablet Take 1 pill three nights week at bedtime.     triamcinolone  ointment (KENALOG) 0.5 % Apply topically 2 (two) times daily.     No current facility-administered medications for this visit.    PHYSICAL EXAMINATION: ECOG PERFORMANCE STATUS: 0 - Asymptomatic  Vitals:   04/20/22 1446  BP: 137/81  Pulse: 78  Resp: 18  Temp: 98 F (36.7 C)  SpO2: 99%   Wt Readings from Last 3 Encounters:  04/20/22 166 lb 6.4 oz (75.5 kg)  04/20/21 171 lb 14.4 oz (78 kg)  04/20/20 180 lb (81.6 kg)     GENERAL:alert, no distress and comfortable SKIN: skin color normal, no rashes or significant lesions EYES: normal, Conjunctiva are pink and non-injected, sclera clear  NEURO: alert & oriented x 3 with fluent speech  LABORATORY DATA:  I have reviewed the data as listed    Latest Ref Rng & Units 04/20/2022    2:27 PM 10/20/2021    9:10 AM 08/07/2021   11:51 AM  CBC  WBC 4.0 - 10.5 K/uL 4.2  3.9  4.5   Hemoglobin 13.0 - 17.0 g/dL 96.2  95.2  84.1   Hematocrit 39.0 - 52.0 % 42.5  41.1  41.1   Platelets 150 - 400 K/uL 123  112  125         Latest Ref Rng & Units 01/18/2021    8:27 AM 07/21/2020    8:27 AM 05/13/2014    8:39 AM  CMP  Glucose 70 - 99 mg/dL 324  401  027   BUN 8 - 23 mg/dL 9  12  25.3   Creatinine 0.61 - 1.24 mg/dL 6.64  4.03  0.8   Sodium 135 - 145 mmol/L 138  135  138   Potassium 3.5 - 5.1 mmol/L 4.2  4.4  4.0   Chloride 98 - 111 mmol/L 107  104    CO2 22 - 32 mmol/L 23  24  29    Calcium 8.9 - 10.3 mg/dL 8.6  8.8  9.1   Total Protein 6.5 - 8.1 g/dL 7.0  7.1  7.5   Total Bilirubin 0.3 - 1.2 mg/dL 0.7  0.6    Alkaline Phos 38 - 126 U/L 229  450  142   AST 15 - 41 U/L 66  129  52   ALT 0 - 44 U/L 38  89  41       RADIOGRAPHIC STUDIES: I have personally reviewed the radiological images as listed and agreed with the findings in the report. No results found.    Orders Placed This Encounter  Procedures   4.74 Abdomen Complete    Standing Status:   Future    Standing Expiration Date:   04/20/2023    Order Specific Question:    Reason for  Exam (SYMPTOM  OR DIAGNOSIS REQUIRED)    Answer:   screening    Order Specific Question:   Preferred imaging location?    Answer:   [facility]   US Abdomen Complete    Standing Status:   Future    Standing Expiration Date:   04/20/2023    Order Specific Question:   Reason for Exam (SYMPTOM  OR DIAGNOSIS REQUIRED)    Answer:   [facility] screening    Order Specific Question:   Preferred imaging location?    Answer:   [facility]   CBC with Differential/Platelet    Standing Status:   Standing    Number of Occurrences:   50    Standing Expiration Date:   04/21/2023   Comprehensive metabolic panel    Standing Status:   Standing    Number of Occurrences:   5    Standing Expiration Date:   04/21/2023   AFP tumor marker    Standing Status:   Standing    Number of Occurrences:   5    Standing Expiration Date:   04/21/2023   Iron and Iron Binding Capacity ([facility] only)    Standing Status:   Standing    Number of Occurrences:   20    Standing Expiration Date:   04/21/2023   Ferritin    Standing Status:   Standing    Number of Occurrences:   20    Standing Expiration Date:   04/21/2023   All questions were answered. The patient knows to call the clinic with any problems, questions or concerns. No barriers to learning was detected. The total time spent in the appointment was 30 minutes.     Truitt Merle, MD 04/20/2022   I, Wilburn Mylar, am acting as scribe for Truitt Merle, MD.   I have reviewed the above documentation for accuracy and completeness, and I agree with the above.

## 2022-04-20 NOTE — Progress Notes (Signed)
Therapeutic phlebotomy performed per MD orders. Started at 1515 and ended at 1523 using 16 gauge phlebotomy kit to right [facility] with 512 G removed. IV removed intact. Gauze applied and secured. Patient given something to drink and offered a snack. Patient observed for 30 minutes post phlebotomy. Vital signs taken prior to discharge. Patient denies any complaints. Ambulatory to lobby.

## 2022-04-20 NOTE — Addendum Note (Signed)
Addended by: Eduard Clos L on: 04/20/2022 02:19 PM   Modules accepted: Orders

## 2022-05-01 ENCOUNTER — Ambulatory Visit: Payer: Medicaid Other | Admitting: Podiatry

## 2022-05-04 ENCOUNTER — Encounter: Payer: Self-pay | Admitting: Hematology

## 2022-05-04 ENCOUNTER — Ambulatory Visit (HOSPITAL_COMMUNITY)
Admission: RE | Admit: 2022-05-04 | Discharge: 2022-05-04 | Disposition: A | Discharge status: Home / Self Care | Payer: Medicaid Other | Source: Ambulatory Visit | Attending: Hematology | Admitting: Hematology

## 2022-05-04 DIAGNOSIS — K7469 Other cirrhosis of liver: Secondary | ICD-10-CM | POA: Insufficient documentation

## 2022-07-24 ENCOUNTER — Inpatient Hospital Stay: Payer: Medicaid Other | Attending: Hematology

## 2022-07-24 ENCOUNTER — Inpatient Hospital Stay: Payer: Medicaid Other

## 2022-07-24 DIAGNOSIS — B1920 Unspecified viral hepatitis C without hepatic coma: Secondary | ICD-10-CM | POA: Diagnosis not present

## 2022-07-24 DIAGNOSIS — E119 Type 2 diabetes mellitus without complications: Secondary | ICD-10-CM | POA: Insufficient documentation

## 2022-07-24 LAB — COMPREHENSIVE METABOLIC PANEL
ALT: 42 U/L (ref 0–44)
AST: 60 U/L — ABNORMAL HIGH (ref 15–41)
Albumin: 3 g/dL — ABNORMAL LOW (ref 3.5–5.0)
Alkaline Phosphatase: 141 U/L — ABNORMAL HIGH (ref 38–126)
Anion gap: 5 (ref 5–15)
BUN: 12 mg/dL (ref 8–23)
CO2: 26 mmol/L (ref 22–32)
Calcium: 8.7 mg/dL — ABNORMAL LOW (ref 8.9–10.3)
Chloride: 102 mmol/L (ref 98–111)
Creatinine, Ser: 0.75 mg/dL (ref 0.61–1.24)
GFR, Estimated: >60 mL/min (ref >60)
Glucose, Bld: 305 mg/dL — ABNORMAL HIGH (ref 70–99)
Potassium: 4.5 mmol/L (ref 3.5–5.1)
Sodium: 133 mmol/L — ABNORMAL LOW (ref 135–145)
Total Bilirubin: 0.7 mg/dL (ref 0.3–1.2)
Total Protein: 6.6 g/dL (ref 6.5–8.1)

## 2022-07-24 LAB — CBC WITH DIFFERENTIAL/PLATELET
Abs Immature Granulocytes: 0.02 10*3/uL (ref 0.00–0.07)
Basophils Absolute: 0 10*3/uL (ref 0.0–0.1)
Basophils Relative: 1 %
Eosinophils Absolute: 0.4 10*3/uL (ref 0.0–0.5)
Eosinophils Relative: 10 %
HCT: 41.3 % (ref 39.0–52.0)
Hemoglobin: 14.6 g/dL (ref 13.0–17.0)
Immature Granulocytes: 1 %
Lymphocytes Relative: 22 %
Lymphs Abs: 0.9 10*3/uL (ref 0.7–4.0)
MCH: 32.3 pg (ref 26.0–34.0)
MCHC: 35.4 g/dL (ref 30.0–36.0)
MCV: 91.4 fL (ref 80.0–100.0)
Monocytes Absolute: 0.4 10*3/uL (ref 0.1–1.0)
Monocytes Relative: 11 %
Neutro Abs: 2.2 10*3/uL (ref 1.7–7.7)
Neutrophils Relative %: 55 %
Platelets: 127 10*3/uL — ABNORMAL LOW (ref 150–400)
RBC: 4.52 MIL/uL (ref 4.22–5.81)
RDW: 14 % (ref 11.5–15.5)
WBC: 4 10*3/uL (ref 4.0–10.5)
nRBC: 0 % (ref 0.0–0.2)

## 2022-07-24 LAB — IRON AND IRON BINDING CAPACITY (CC-WL,HP ONLY)
Iron: 308 ug/dL — ABNORMAL HIGH (ref 45–182)
Saturation Ratios: 97 % — ABNORMAL HIGH (ref 17.9–39.5)
TIBC: 318 ug/dL (ref 250–450)
UIBC: 10 ug/dL — ABNORMAL LOW (ref 117–376)

## 2022-07-24 LAB — FERRITIN: Ferritin: 83 ng/mL (ref 24–336)

## 2022-07-24 NOTE — Patient Instructions (Signed)

## 2022-07-24 NOTE — Progress Notes (Signed)
John [name] presents today for phlebotomy per MD orders. Phlebotomy procedure started at 0914 and ended at 0921. 514 grams removed. Patient observed for 30 minutes after procedure without any incident. Patient tolerated procedure well. IV needle removed intact. Beverage provided.

## 2022-07-25 ENCOUNTER — Telehealth: Payer: Self-pay

## 2022-07-25 ENCOUNTER — Inpatient Hospital Stay (HOSPITAL_BASED_OUTPATIENT_CLINIC_OR_DEPARTMENT_OTHER): Payer: Medicaid Other | Admitting: Hematology

## 2022-07-25 ENCOUNTER — Other Ambulatory Visit: Payer: Self-pay

## 2022-07-25 DIAGNOSIS — R7401 Elevation of levels of liver transaminase levels: Secondary | ICD-10-CM | POA: Diagnosis not present

## 2022-07-25 DIAGNOSIS — B1920 Unspecified viral hepatitis C without hepatic coma: Secondary | ICD-10-CM | POA: Diagnosis not present

## 2022-07-25 DIAGNOSIS — E119 Type 2 diabetes mellitus without complications: Secondary | ICD-10-CM

## 2022-07-25 NOTE — Telephone Encounter (Signed)
Pt called with concerns regarding his iron levels.  Pt stated he came in yesterday for a Therapeutic Phlebotomy but his iron levels are not going down but instead are continuing to elevated.  Pt stated he would like to speak with Dr. Burr Medico regarding his plan of care.  Informed pt that Dr. Burr Medico is currently in clinic seeing pts but this RN will make her aware of the pt's call and concerns.  Informed pt that Dr. Burr Medico will give him a call this afternoon to further discuss his tx plan.

## 2022-07-25 NOTE — Progress Notes (Addendum)
[place]   [phone] Fax:(336) [phone]   Clinic Follow up Note   Patient Care Team: Vicenta Aly, [place] as PCP - General (Nurse Practitioner) Percell Belt, MD as Referring Physician (Internal Medicine) Truitt Merle, MD as Attending Physician (Hematology and Oncology)  Date of Service:  07/27/2022  I connected with Caren Hazy on 07/27/2022 at  4:00 PM EST by telephone visit and verified that I am speaking with the correct person using two identifiers.  I discussed the limitations, risks, security and privacy concerns of performing an evaluation and management service by telephone and the availability of in person appointments. I also discussed with the patient that there may be a patient responsible charge related to this service. The patient expressed understanding and agreed to proceed.   Other persons participating in the visit and their role in the encounter:  no  Patient's location:  Home Provider's location:  Office  CHIEF COMPLAINT: f/u of  hemochromatosis   CURRENT THERAPY:   Therapeutic phlebotomy PRN             -Goal: ferritin <50 and transferrin saturation <50%             -hold if hgb >12  ASSESSMENT & PLAN:  John [name] is a 67 y.o. male with   1. Hereditary hemochromatosis.  -managed by hematology with therapeutic phlebotomies as needed since at least 2008. -last abdomen US 10/16/21 was stable.  -Patient has questions about his recent lab.  I reviewed with him, ferritin 83, serum iron level was elevated at 308, with TIBC 318.  I discussed that serum iron level fluctuates, especially after meals.  Based on the ferritin level, I do not think he needs additional phlebotomy. -Will continue lab and phlebotomy every 3 months if needed  2. 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  -Continue to f/u with Dr. Liz Malady  -Continue [place] screening by AFP and abdominal ultrasound every 6 months here     PLAN:  -Discuss Ferritin level and serum iron level  -Continue Phlebotomy for ferritin >50 or saturation>50% -Encourage the pt to f/u with his PCP about BP issue   INTERVAL HISTORY:  John [name] was contacted for a follow up of  hemochromatosis . He was last seen by me on 04/20/2022. Pt stated that his BP dropped last night and he fainted. BP 80/45. Pt reports he started a new  medication for his vascular veins.   All other systems were reviewed with the patient and are negative.  MEDICAL HISTORY:  Past Medical History:  Diagnosis Date   Anxiety    Diabetes ([place])    Foot pain left   to have MRI tues.    Hepatitis C    High cholesterol     SURGICAL HISTORY: Past Surgical History:  Procedure Laterality Date   None      I have reviewed the social history and family history with the patient and they are unchanged from previous note.  ALLERGIES:  is allergic to sulfa antibiotics, sulfamethoxazole, and testosterone.  MEDICATIONS:  Current Outpatient Medications  Medication Sig Dispense Refill   ACCU-CHEK AVIVA PLUS test strip SMARTSIG:1 Strip(s) Via Meter 5 Times Daily     albuterol (VENTOLIN HFA) 108 (90 Base) MCG/ACT inhaler albuterol sulfate HFA 90 mcg/actuation aerosol inhaler  INHALE 2 PUFFS BY MOUTH EVERY 4 HOURS AS NEEDED FOR WHEEZING     azelastine (ASTELIN) 0.1 % nasal spray azelastine 137 mcg (0.1 %)  nasal spray aerosol  USE 2 SPRAY(S) IN EACH NOSTRIL 4 TIMES DAILY FOR 3 5 DAYS . THEN USE TWICE DAILY     Continuous Blood Gluc Receiver (FREESTYLE LIBRE 2 READER) DEVI Scan as needed for continuous glucose monitoring.     Continuous Blood Gluc Sensor (FREESTYLE LIBRE 2 SENSOR) MISC Apply topically.     gabapentin (NEURONTIN) 100 MG capsule Take 1 capsule by mouth 3 (three) times daily.     gabapentin (NEURONTIN) 300 MG capsule Take 1 capsule by mouth at bedtime.     glucagon 1 MG injection Inject 1 mg into the vein once as needed.     Glucagon 3 MG/DOSE POWD Place  into the nose.     insulin aspart (NOVOLOG) 100 unit/mL injection Inject into the skin.     insulin glargine (LANTUS) 100 UNIT/ML injection Inject 30 Units into the skin daily.     Insulin Pen Needle 31G X 8 MM MISC Injects 4 times daily     ketoconazole (NIZORAL) 2 % cream Apply 1 application topically daily. 60 g 2   lactulose (CHRONULAC) 10 GM/15ML solution as needed.     propranolol (INDERAL) 40 MG tablet Take 40 mg by mouth 2 (two) times daily.      rosuvastatin (CRESTOR) 5 MG tablet Take 1 pill three nights week at bedtime.     triamcinolone ointment (KENALOG) 0.5 % Apply topically 2 (two) times daily.     No current facility-administered medications for this visit.    PHYSICAL EXAMINATION: ECOG PERFORMANCE STATUS: 2 - Symptomatic, <50% confined to bed  There were no vitals filed for this visit. Wt Readings from Last 3 Encounters:  04/20/22 166 lb 6.4 oz (75.5 kg)  04/20/21 171 lb 14.4 oz (78 kg)  04/20/20 180 lb (81.6 kg)     No vitals taken today, Exam not performed today  LABORATORY DATA:  I have reviewed the data as listed    Latest Ref Rng & Units 07/24/2022    8:25 AM 04/20/2022    2:27 PM 10/20/2021    9:10 AM  CBC  WBC 4.0 - 10.5 K/uL 4.0  4.2  3.9   Hemoglobin 13.0 - 17.0 g/dL 14.6  14.9  14.5   Hematocrit 39.0 - 52.0 % 41.3  42.5  41.1   Platelets 150 - 400 K/uL 127  123  112         Latest Ref Rng & Units 07/24/2022    8:25 AM 01/18/2021    8:27 AM 07/21/2020    8:27 AM  CMP  Glucose 70 - 99 mg/dL 305  125  200   BUN 8 - 23 mg/dL 12  9  12   $ Creatinine 0.61 - 1.24 mg/dL 0.75  0.79  0.88   Sodium 135 - 145 mmol/L 133  138  135   Potassium 3.5 - 5.1 mmol/L 4.5  4.2  4.4   Chloride 98 - 111 mmol/L 102  107  104   CO2 22 - 32 mmol/L 26  23  24   $ Calcium 8.9 - 10.3 mg/dL 8.7  8.6  8.8   Total Protein 6.5 - 8.1 g/dL 6.6  7.0  7.1   Total Bilirubin 0.3 - 1.2 mg/dL 0.7  0.7  0.6   Alkaline Phos 38 - 126 U/L 141  229  450   AST 15 - 41 U/L 60  66  129    ALT 0 - 44 U/L 42  38  89  RADIOGRAPHIC STUDIES: I have personally reviewed the radiological images as listed and agreed with the findings in the report. No results found.    No orders of the defined types were placed in this encounter.  All questions were answered. The patient knows to call the clinic with any problems, questions or concerns. No barriers to learning was detected. The total time spent in the appointment was 11 minutes.     Truitt Merle, MD 07/27/2022   Felicity Coyer am acting as scribe for Truitt Merle, MD.   I have reviewed the above documentation for accuracy and completeness, and I agree with the above.

## 2022-07-26 LAB — AFP TUMOR MARKER: AFP, Serum, Tumor Marker: 3.7 ng/mL (ref 0.0–8.4)

## 2022-07-27 ENCOUNTER — Encounter: Payer: Self-pay | Admitting: Hematology

## 2022-07-27 ENCOUNTER — Encounter: Payer: Self-pay | Admitting: Internal Medicine

## 2022-07-31 ENCOUNTER — Encounter: Payer: Self-pay | Admitting: Hematology

## 2022-07-31 ENCOUNTER — Encounter: Payer: Self-pay | Admitting: Internal Medicine

## 2022-07-31 ENCOUNTER — Other Ambulatory Visit: Payer: Self-pay

## 2022-07-31 DIAGNOSIS — E861 Hypovolemia: Secondary | ICD-10-CM | POA: Insufficient documentation

## 2022-08-01 ENCOUNTER — Other Ambulatory Visit: Payer: Self-pay | Admitting: Nurse Practitioner

## 2022-10-18 ENCOUNTER — Telehealth: Payer: Self-pay | Admitting: Hematology

## 2022-10-18 ENCOUNTER — Ambulatory Visit (HOSPITAL_COMMUNITY): Payer: Medicaid Other

## 2022-10-18 NOTE — Telephone Encounter (Signed)
Contacted patient to scheduled appointments. Left message with appointment details and a call back number if patient had any questions or could not accommodate the time we provided.   

## 2022-10-22 ENCOUNTER — Other Ambulatory Visit: Payer: Self-pay

## 2022-10-22 ENCOUNTER — Other Ambulatory Visit: Payer: Medicaid Other

## 2022-10-22 ENCOUNTER — Inpatient Hospital Stay: Payer: Medicaid Other

## 2022-10-22 ENCOUNTER — Inpatient Hospital Stay: Payer: Medicaid Other | Attending: Hematology

## 2022-10-22 DIAGNOSIS — E861 Hypovolemia: Secondary | ICD-10-CM

## 2022-10-22 LAB — CBC WITH DIFFERENTIAL/PLATELET
Abs Immature Granulocytes: 0.02 10*3/uL (ref 0.00–0.07)
Basophils Absolute: 0 10*3/uL (ref 0.0–0.1)
Basophils Relative: 1 %
Eosinophils Absolute: 0.4 10*3/uL (ref 0.0–0.5)
Eosinophils Relative: 9 %
HCT: 40.6 % (ref 39.0–52.0)
Hemoglobin: 14 g/dL (ref 13.0–17.0)
Immature Granulocytes: 1 %
Lymphocytes Relative: 21 %
Lymphs Abs: 0.8 10*3/uL (ref 0.7–4.0)
MCH: 32.3 pg (ref 26.0–34.0)
MCHC: 34.5 g/dL (ref 30.0–36.0)
MCV: 93.8 fL (ref 80.0–100.0)
Monocytes Absolute: 0.5 10*3/uL (ref 0.1–1.0)
Monocytes Relative: 12 %
Neutro Abs: 2.3 10*3/uL (ref 1.7–7.7)
Neutrophils Relative %: 56 %
Platelets: 144 10*3/uL — ABNORMAL LOW (ref 150–400)
RBC: 4.33 MIL/uL (ref 4.22–5.81)
RDW: 13.3 % (ref 11.5–15.5)
WBC: 4 10*3/uL (ref 4.0–10.5)
nRBC: 0 % (ref 0.0–0.2)

## 2022-10-22 LAB — IRON AND IRON BINDING CAPACITY (CC-WL,HP ONLY)
Iron: 102 ug/dL (ref 45–182)
Saturation Ratios: 26 % (ref 17.9–39.5)
TIBC: 399 ug/dL (ref 250–450)
UIBC: 297 ug/dL (ref 117–376)

## 2022-10-22 LAB — FERRITIN: Ferritin: 26 ng/mL (ref 24–336)

## 2022-10-22 MED ORDER — SODIUM CHLORIDE 0.9 % IV SOLN: INTRAVENOUS | Status: DC

## 2022-10-22 NOTE — Progress Notes (Signed)
Patient remained for 1 hour post phlebotomy to receive ordered IVF.  VSS at discharge, no c/o dizziness.  Ambulated to lobby.

## 2022-10-22 NOTE — Patient Instructions (Signed)
Therapeutic Phlebotomy Therapeutic phlebotomy is the planned removal of blood from a person's body for the purpose of treating a medical condition. The procedure is lot like donating blood. Usually, about a pint (470 mL, or 0.47 L) of blood is removed. The average adult has 9-12 pints (4.3-5.7 L) of blood in his or her body. Therapeutic phlebotomy may be used to treat the following medical conditions: Hemochromatosis. This is a condition in which the blood contains too much iron. Polycythemia vera. This is a condition in which the blood contains too many red blood cells. Porphyria cutanea tarda. This is a disease in which an important part of hemoglobin is not made properly. It results in the buildup of abnormal amounts of porphyrins in the body. Sickle cell disease. This is a condition in which the red blood cells form an abnormal crescent shape rather than a round shape. Tell a health care provider about: Any allergies you have. All medicines you are taking, including vitamins, herbs, eye drops, creams, and over-the-counter medicines. Any bleeding problems you have. Any surgeries you have had. Any medical conditions you have. Whether you are pregnant or may be pregnant. What are the risks? Generally, this is a safe procedure. However, problems may occur, including: Nausea or light-headedness. Low blood pressure (hypotension). Soreness, bleeding, swelling, or bruising at the needle insertion site. Infection. What happens before the procedure? Ask your health care provider about: Changing or stopping your regular medicines. This is especially important if you are taking diabetes medicines or blood thinners. Taking medicines such as aspirin and ibuprofen. These medicines can thin your blood. Do not take these medicines unless your health care provider tells you to take them. Taking over-the-counter medicines, vitamins, herbs, and supplements. Wear clothing with sleeves that can be raised  above the elbow. You may have a blood sample taken. Your blood pressure, pulse rate, and breathing rate will be measured. What happens during the procedure?  You may be given a medicine to numb the area (local anesthetic). A tourniquet will be placed on your arm. A needle will be put into one of your veins. Tubing and a collection bag will be attached to the needle. Blood will flow through the needle and tubing into the collection bag. The collection bag will be placed lower than your arm so gravity can help the blood flow into the bag. You may be asked to open and close your hand slowly and continually during the entire collection. After the specified amount of blood has been removed from your body, the collection bag and tubing will be clamped. The needle will be removed from your vein. Pressure will be held on the needle site to stop the bleeding. A bandage (dressing) will be placed over the needle insertion site. The procedure may vary among health care providers and hospitals. What happens after the procedure? Your blood pressure, pulse rate, and breathing rate will be measured after the procedure. You will be encouraged to drink fluids. You will be encouraged to eat a snack to prevent a low blood sugar level. Your recovery will be assessed and monitored. Return to your normal activities as told by your health care provider. Summary Therapeutic phlebotomy is the planned removal of blood from a person's body for the purpose of treating a medical condition. Therapeutic phlebotomy may be used to treat hemochromatosis, polycythemia vera, porphyria cutanea tarda, or sickle cell disease. In the procedure, a needle is inserted and about a pint (470 mL, or 0.47 L) of blood is   removed. The average adult has 9-12 pints (4.3-5.7 L) of blood in the body. This is generally a safe procedure, but it can sometimes cause problems such as nausea, light-headedness, or low blood pressure  (hypotension). This information is not intended to replace advice given to you by your health care provider. Make sure you discuss any questions you have with your health care provider. Document Revised: 11/23/2020 Document Reviewed: 11/23/2020 Elsevier Patient Education  2023 [employer]  Dehydration, Adult Dehydration is a condition in which there is not enough water or other fluids in the body. This happens when a person loses more fluids than they take in. Important organs, such as the kidneys, brain, and heart, cannot function without a proper amount of fluids. Any loss of fluids from the body can lead to dehydration. Dehydration can be mild, moderate, or severe. It should be treated right away to prevent it from becoming severe. What are the causes? Dehydration may be caused by: Health conditions, such as diarrhea, vomiting, fever, infection, or sweating or urinating a lot. Not drinking enough fluids. Certain medicines, such as medicines that remove excess fluid from the body (diuretics). Lack of safe drinking water. Not being able to get enough water and food. What increases the risk? The following factors may make you more likely to develop this condition: Having a long-term (chronic) illness that has not been treated properly, such as diabetes, heart disease, or kidney disease. Being 47 years of age or older. Having a disability. Living in a place that is high in altitude, where thinner, drier air causes more fluid loss. Doing exercises that put stress on your body for a long time (endurance sports). Being active in a hot climate. What are the signs or symptoms? Symptoms of dehydration depend on how severe it is. Mild or moderate dehydration Thirst. Dry lips or dry mouth. Dizziness or light-headedness. Muscle cramps. Dark urine. Urine may be the color of tea. Less urine or tears produced than usual. Headache. Severe dehydration Changes in skin. Your skin may be cold and  clammy, blotchy, or pale. Your skin also may not return to normal after being lightly pinched and released. Little or no tears, urine, or sweat. Rapid breathing and low blood pressure. Your pulse may be weak or may be faster than 100 beats per minute when you are sitting still. Other changes, such as: Feeling very thirsty. Sunken eyes. Cold hands and feet. Confusion. Being very tired (lethargic) or having trouble waking from sleep. Short-term weight loss. Loss of consciousness. How is this diagnosed? This condition is diagnosed based on your symptoms and a physical exam. You may have blood and urine tests to help confirm the diagnosis. How is this treated? Treatment for this condition depends on how severe it is. Treatment should be started right away. Do not wait until dehydration becomes severe. Severe dehydration is an emergency and needs to be treated in a hospital. Mild or moderate dehydration can be treated at home. You may be asked to: Drink more fluids. Drink an oral rehydration solution (ORS). This drink restores fluids, salts, and minerals in the blood (electrolytes). Stop any activities that caused dehydration, such as exercise. Cool off with cool compresses, cool mist, or cool fluids, if heat or too much sweat caused your condition. Take medicine to treat fever, if fever caused your condition. Take medicine to treat nausea and diarrhea, if vomiting or diarrhea caused your condition. Severe dehydration can be treated: With IV fluids. By correcting abnormal levels of electrolytes  in your body. By treating the underlying cause of dehydration. Follow these instructions at home: Oral rehydration solution If told by your health care provider, drink an ORS: Make an ORS by following instructions on the package. Start by drinking small amounts, about  cup (120 mL) every 5-10 minutes. Slowly increase how much you drink until you have taken the amount recommended by your health care  provider.  Eating and drinking  Drink enough clear fluid to keep your urine pale yellow. If you were told to drink an ORS, finish the ORS first and then start slowly drinking other clear fluids. Drink fluids such as: Water. Do not drink only water. Doing that can lead to hyponatremia, which is having too little salt (sodium) in the body. Water from ice chips you suck on. Diluted fruit juice. This is fruit juice that you have added water to. Low-calorie sports drinks. Eat foods that contain a healthy balance of electrolytes, such as bananas, oranges, potatoes, tomatoes, and spinach. Do not drink alcohol. Avoid the following: Drinks that contain a lot of sugar. These include high-calorie sports drinks, fruit juice that is not diluted, and soda. Caffeine. Foods that are greasy or contain a lot of fat or sugar. General instructions Take over-the-counter and prescription medicines only as told by your health care provider. Do not take sodium tablets. Doing that can lead to having too much sodium in the body (hypernatremia). Return to your normal activities as told by your health care provider. Ask your health care provider what activities are safe for you. Keep all follow-up visits. Your health care provider may need to check your progress and suggest new ways to treat your condition. Contact a health care provider if: You have muscle cramps, pain, or discomfort, such as: Pain in your abdomen and the pain gets worse or stays in one area. Stiff neck. You have a rash. You are more irritable than usual. You are sleepier or have a harder time waking. You feel weak or dizzy. You feel very thirsty. Get help right away if: You have symptoms of severe dehydration. You vomit every time you eat or drink. Your vomiting gets worse, does not go away, or includes blood or green matter (bile). You are getting treatment but symptoms are getting worse. You have a fever. You have a severe headache. You  have: Diarrhea that gets worse or does not go away. Blood in your stool. This may cause stool to look black and tarry. Not urinating, or urinating only a small amount of very dark urine, within 6-8 hours. You have trouble breathing. These symptoms may be an emergency. Get help right away. Do not wait to see if the symptoms will go away. Do not drive yourself to the hospital. Call 911. This information is not intended to replace advice given to you by your health care provider. Make sure you discuss any questions you have with your health care provider. Document Revised: 12/25/2021 Document Reviewed: 12/25/2021 Elsevier Patient Education  2023 [employer].

## 2022-10-23 ENCOUNTER — Encounter: Payer: Self-pay | Admitting: Hematology

## 2022-10-25 ENCOUNTER — Ambulatory Visit (HOSPITAL_COMMUNITY): Admission: RE | Admit: 2022-10-25 | Payer: Medicaid Other | Source: Ambulatory Visit

## 2022-10-25 ENCOUNTER — Encounter (HOSPITAL_COMMUNITY): Payer: Self-pay

## 2022-11-08 ENCOUNTER — Ambulatory Visit (HOSPITAL_COMMUNITY): Admission: RE | Admit: 2022-11-08 | Payer: Medicaid Other | Source: Ambulatory Visit

## 2023-01-15 ENCOUNTER — Encounter: Payer: Self-pay | Admitting: Internal Medicine

## 2023-01-22 ENCOUNTER — Inpatient Hospital Stay: Payer: Medicare HMO | Attending: Hematology

## 2023-01-22 ENCOUNTER — Inpatient Hospital Stay: Payer: Medicare HMO

## 2023-01-22 ENCOUNTER — Other Ambulatory Visit: Payer: Self-pay

## 2023-01-22 LAB — CBC WITH DIFFERENTIAL/PLATELET
Abs Immature Granulocytes: 0.03 10*3/uL (ref 0.00–0.07)
Basophils Absolute: 0 10*3/uL (ref 0.0–0.1)
Basophils Relative: 0 %
Eosinophils Absolute: 0.5 10*3/uL (ref 0.0–0.5)
Eosinophils Relative: 12 %
HCT: 41.2 % (ref 39.0–52.0)
Hemoglobin: 14.4 g/dL (ref 13.0–17.0)
Immature Granulocytes: 1 %
Lymphocytes Relative: 23 %
Lymphs Abs: 1 10*3/uL (ref 0.7–4.0)
MCH: 31 pg (ref 26.0–34.0)
MCHC: 35 g/dL (ref 30.0–36.0)
MCV: 88.8 fL (ref 80.0–100.0)
Monocytes Absolute: 0.5 10*3/uL (ref 0.1–1.0)
Monocytes Relative: 11 %
Neutro Abs: 2.4 10*3/uL (ref 1.7–7.7)
Neutrophils Relative %: 53 %
Platelets: 145 10*3/uL — ABNORMAL LOW (ref 150–400)
RBC: 4.64 MIL/uL (ref 4.22–5.81)
RDW: 14.3 % (ref 11.5–15.5)
WBC: 4.6 10*3/uL (ref 4.0–10.5)
nRBC: 0 % (ref 0.0–0.2)

## 2023-01-22 LAB — COMPREHENSIVE METABOLIC PANEL
ALT: 41 U/L (ref 0–44)
AST: 63 U/L — ABNORMAL HIGH (ref 15–41)
Albumin: 3.2 g/dL — ABNORMAL LOW (ref 3.5–5.0)
Alkaline Phosphatase: 154 U/L — ABNORMAL HIGH (ref 38–126)
Anion gap: 5 (ref 5–15)
BUN: 14 mg/dL (ref 8–23)
CO2: 24 mmol/L (ref 22–32)
Calcium: 8.6 mg/dL — ABNORMAL LOW (ref 8.9–10.3)
Chloride: 108 mmol/L (ref 98–111)
Creatinine, Ser: 0.75 mg/dL (ref 0.61–1.24)
GFR, Estimated: >60 mL/min (ref >60)
Glucose, Bld: 83 mg/dL (ref 70–99)
Potassium: 4.3 mmol/L (ref 3.5–5.1)
Sodium: 137 mmol/L (ref 135–145)
Total Bilirubin: 0.5 mg/dL (ref 0.3–1.2)
Total Protein: 7.2 g/dL (ref 6.5–8.1)

## 2023-01-22 LAB — IRON AND IRON BINDING CAPACITY (CC-WL,HP ONLY)
Iron: 203 ug/dL — ABNORMAL HIGH (ref 45–182)
Saturation Ratios: 50 % — ABNORMAL HIGH (ref 17.9–39.5)
TIBC: 406 ug/dL (ref 250–450)
UIBC: 203 ug/dL (ref 117–376)

## 2023-01-22 LAB — FERRITIN: Ferritin: 36 ng/mL (ref 24–336)

## 2023-01-22 NOTE — Progress Notes (Signed)
Per Dr Mosetta Putt, no phlebotomy needed today. Pt aware and agrees. Pt declined fluids today. Lab report printed for pt.

## 2023-04-23 ENCOUNTER — Other Ambulatory Visit: Payer: Self-pay

## 2023-04-23 DIAGNOSIS — K7469 Other cirrhosis of liver: Secondary | ICD-10-CM

## 2023-04-24 ENCOUNTER — Ambulatory Visit: Payer: Medicaid Other | Admitting: Hematology

## 2023-04-24 ENCOUNTER — Inpatient Hospital Stay: Payer: Medicare HMO

## 2023-04-24 ENCOUNTER — Inpatient Hospital Stay (HOSPITAL_BASED_OUTPATIENT_CLINIC_OR_DEPARTMENT_OTHER): Payer: Medicare HMO | Admitting: Nurse Practitioner

## 2023-04-24 ENCOUNTER — Inpatient Hospital Stay: Payer: Medicare HMO | Attending: Hematology | Admitting: Nurse Practitioner

## 2023-04-24 VITALS — BP 138/75 | HR 76 | Temp 98.0°F | Resp 18 | Ht 72.0 in | Wt 165.9 lb

## 2023-04-24 DIAGNOSIS — K7469 Other cirrhosis of liver: Secondary | ICD-10-CM

## 2023-04-24 LAB — CBC WITH DIFFERENTIAL (CANCER CENTER ONLY)
Abs Immature Granulocytes: 0.02 10*3/uL (ref 0.00–0.07)
Basophils Absolute: 0 10*3/uL (ref 0.0–0.1)
Basophils Relative: 1 %
Eosinophils Absolute: 0.3 10*3/uL (ref 0.0–0.5)
Eosinophils Relative: 9 %
HCT: 40.3 % (ref 39.0–52.0)
Hemoglobin: 14.2 g/dL (ref 13.0–17.0)
Immature Granulocytes: 1 %
Lymphocytes Relative: 21 %
Lymphs Abs: 0.8 10*3/uL (ref 0.7–4.0)
MCH: 32.4 pg (ref 26.0–34.0)
MCHC: 35.2 g/dL (ref 30.0–36.0)
MCV: 92 fL (ref 80.0–100.0)
Monocytes Absolute: 0.4 10*3/uL (ref 0.1–1.0)
Monocytes Relative: 11 %
Neutro Abs: 2.2 10*3/uL (ref 1.7–7.7)
Neutrophils Relative %: 57 %
Platelet Count: 117 10*3/uL — ABNORMAL LOW (ref 150–400)
RBC: 4.38 MIL/uL (ref 4.22–5.81)
RDW: 14.4 % (ref 11.5–15.5)
WBC Count: 3.7 10*3/uL — ABNORMAL LOW (ref 4.0–10.5)
nRBC: 0 % (ref 0.0–0.2)

## 2023-04-24 LAB — IRON AND IRON BINDING CAPACITY (CC-WL,HP ONLY)
Iron: 315 ug/dL — ABNORMAL HIGH (ref 45–182)
Saturation Ratios: 92 % — ABNORMAL HIGH (ref 17.9–39.5)
TIBC: 344 ug/dL (ref 250–450)
UIBC: 29 ug/dL — ABNORMAL LOW (ref 117–376)

## 2023-04-24 LAB — CMP (CANCER CENTER ONLY)
ALT: 40 U/L (ref 0–44)
AST: 65 U/L — ABNORMAL HIGH (ref 15–41)
Albumin: 3.2 g/dL — ABNORMAL LOW (ref 3.5–5.0)
Alkaline Phosphatase: 145 U/L — ABNORMAL HIGH (ref 38–126)
Anion gap: 5 (ref 5–15)
BUN: 11 mg/dL (ref 8–23)
CO2: 24 mmol/L (ref 22–32)
Calcium: 8.5 mg/dL — ABNORMAL LOW (ref 8.9–10.3)
Chloride: 108 mmol/L (ref 98–111)
Creatinine: 0.71 mg/dL (ref 0.61–1.24)
GFR, Estimated: >60 mL/min (ref >60)
Glucose, Bld: 144 mg/dL — ABNORMAL HIGH (ref 70–99)
Potassium: 3.9 mmol/L (ref 3.5–5.1)
Sodium: 137 mmol/L (ref 135–145)
Total Bilirubin: 0.7 mg/dL (ref <1.2)
Total Protein: 7 g/dL (ref 6.5–8.1)

## 2023-04-24 LAB — FERRITIN: Ferritin: 100 ng/mL (ref 24–336)

## 2023-04-24 NOTE — Progress Notes (Addendum)
Patient Care Team: Elizabeth Palau, FNP as PCP - General (Nurse Practitioner) Angelyn Punt, MD as Referring Physician (Internal Medicine) Malachy Mood, MD as Attending Physician (Hematology and Oncology)  Clinic Day:  04/24/2023  Referring physician: Elizabeth Palau, FNP  ASSESSMENT & PLAN:   Assessment & Plan: Hemochromatosis Treated with therapeutic phlebotomy with following parameters  -ferritin < 50 and transferrin ratio < 50%  -hold phlebotomy for hgb 12 Most recent phlebotomy treatment done 07/24/2022.  04/24/2023 - therapeutic phlebotomy today.   Plan:  Labs reviewed  -CBC showing WBC 3.7; Hgb 14.2; Hct 40.31; Plt 117; Anc 2.2 -ferritin - 100 -iron 315; TIBC 344; transferrin ratio 92%; UIBC 29 -CMP - K 3.9; glucose 144; BUN 11; Creatinine 0.71; eGFR >60; Ca 8.5; AST 65; ALT 40; AlkPhos 145.   Patient to be scheduled for phlebotomy treatment today 04/24/2023 Surveillance abdominal ultrasound ordered Labs every 3 months with therapeutic phlebotomy as indicated.  Routine follow up in 1 year.   The patient understands the plans discussed today and is in agreement with them.  He knows to contact our office if he develops concerns prior to his next appointment.  I provided 30 minutes of face-to-face time during this encounter and > 50% was spent counseling as documented under my assessment and plan.    Carlean Jews, NP  [facility] [address] [place] [address] Dept: [phone] Dept Fax: [phone]   Orders Placed This Encounter  Procedures   US Abdomen Complete    Standing Status:   Future    Standing Expiration Date:   04/23/2024    Order Specific Question:   Reason for Exam (SYMPTOM  OR DIAGNOSIS REQUIRED)    Answer:   liver cirrhosis, hemochromatosis.    Order Specific Question:   Preferred imaging location?    Answer:   [facility]      CHIEF  COMPLAINT:  CC: hereditary hemochromatosis   Current Treatment:  therapeutic phlebotomy PRN -goal is for ferritin < 50 and transferrin saturation < 50% -hold for hgb < 12  INTERVAL HISTORY:  John [name] is here today for repeat clinical assessment. He was last seen by Dr. Mosetta Putt in 10/2022. Most recent phlebotomy treatment was 07/2022. He is concerned because he believed his levels (iron and iron saturation) were elevated. We reviewed most recent lab results which showed normal ferritin and high normal transferrin saturation %. Reviewed phlebotomy parameters. Agree that he should have phlebotomy treatment later today. He denies fevers or chills. He denies pain. His appetite is good. His weight has been stable.  I have reviewed the past medical history, past surgical history, social history and family history with the patient and they are unchanged from previous note.  ALLERGIES:  is allergic to sulfa antibiotics, sulfamethoxazole, and testosterone.  MEDICATIONS:  Current Outpatient Medications  Medication Sig Dispense Refill   ACCU-CHEK AVIVA PLUS test strip SMARTSIG:1 Strip(s) Via Meter 5 Times Daily     albuterol (VENTOLIN HFA) 108 (90 Base) MCG/ACT inhaler albuterol sulfate HFA 90 mcg/actuation aerosol inhaler  INHALE 2 PUFFS BY MOUTH EVERY 4 HOURS AS NEEDED FOR WHEEZING     azelastine (ASTELIN) 0.1 % nasal spray azelastine 137 mcg (0.1 %) nasal spray aerosol  USE 2 SPRAY(S) IN EACH NOSTRIL 4 TIMES DAILY FOR 3 5 DAYS . THEN USE TWICE DAILY     Continuous Blood Gluc Receiver (FREESTYLE LIBRE 2 READER) DEVI Scan  as needed for continuous glucose monitoring.     Continuous Blood Gluc Sensor (FREESTYLE LIBRE 2 SENSOR) MISC Apply topically.     gabapentin (NEURONTIN) 100 MG capsule Take 1 capsule by mouth 3 (three) times daily.     gabapentin (NEURONTIN) 300 MG capsule Take 1 capsule by mouth at bedtime.     glucagon 1 MG injection Inject 1 mg into the vein once as needed.     Glucagon 3 MG/DOSE POWD  Place into the nose.     insulin aspart (NOVOLOG) 100 unit/mL injection Inject into the skin.     insulin glargine (LANTUS) 100 UNIT/ML injection Inject 30 Units into the skin daily.     Insulin Pen Needle 31G X 8 MM MISC Injects 4 times daily     ketoconazole (NIZORAL) 2 % cream Apply 1 application topically daily. 60 g 2   lactulose (CHRONULAC) 10 GM/15ML solution as needed.     No current facility-administered medications for this visit.     REVIEW OF SYSTEMS:   Constitutional: Denies fevers, chills or abnormal weight loss Eyes: Denies blurriness of vision Ears, nose, mouth, throat, and face: Denies mucositis or sore throat Respiratory: Denies cough, dyspnea or wheezes Cardiovascular: Denies palpitation, chest discomfort or lower extremity swelling Gastrointestinal:  Denies nausea, heartburn or change in bowel habits Skin: Denies abnormal skin rashes Lymphatics: Denies new lymphadenopathy or easy bruising Neurological:Denies numbness, tingling or new weaknesses Behavioral/Psych: Mood is stable, no new changes  All other systems were reviewed with the patient and are negative.   VITALS:   Today's Vitals   04/24/23 0903 04/24/23 0910  BP: 138/75   Pulse: 76   Resp: 18   Temp: 98 F (36.7 C)   TempSrc: Oral   SpO2: 98%   Weight: 165 lb 14.4 oz (75.3 kg)   Height: 6' (1.829 m)   PainSc:  0-No pain   Body mass index is 22.5 kg/m.    Wt Readings from Last 3 Encounters:  04/24/23 165 lb 14.4 oz (75.3 kg)  04/20/22 166 lb 6.4 oz (75.5 kg)  04/20/21 171 lb 14.4 oz (78 kg)    Body mass index is 22.5 kg/m.  Performance status (ECOG): 0 - Asymptomatic  PHYSICAL EXAM:   GENERAL:alert, no distress and comfortable SKIN: skin color, texture, turgor are normal, no rashes or significant lesions EYES: normal, Conjunctiva are pink and non-injected, sclera clear OROPHARYNX:no exudate, no erythema and lips, buccal mucosa, and tongue normal  NECK: supple, thyroid normal size,  non-tender, without nodularity LYMPH:  no palpable lymphadenopathy in the cervical, axillary or inguinal LUNGS: clear to auscultation and percussion with normal breathing effort HEART: regular rate & rhythm and no murmurs and no lower extremity edema ABDOMEN:abdomen soft, non-tender and normal bowel sounds Musculoskeletal:no cyanosis of digits and no clubbing  NEURO: alert & oriented x 3 with fluent speech, no focal motor/sensory deficits  LABORATORY DATA:  I have reviewed the data as listed    Component Value Date/Time   NA 137 04/24/2023 0847   NA 138 05/13/2014 0839   K 3.9 04/24/2023 0847   K 4.0 05/13/2014 0839   CL 108 04/24/2023 0847   CO2 24 04/24/2023 0847   CO2 29 05/13/2014 0839   GLUCOSE 144 (H) 04/24/2023 0847   GLUCOSE 177 (H) 05/13/2014 0839   BUN 11 04/24/2023 0847   BUN 10.8 05/13/2014 0839   CREATININE 0.71 04/24/2023 0847   CREATININE 0.8 05/13/2014 0839   CALCIUM 8.5 (L) 04/24/2023 9528  CALCIUM 9.1 05/13/2014 0839   PROT 7.0 04/24/2023 0847   PROT 7.5 05/13/2014 0839   ALBUMIN 3.2 (L) 04/24/2023 0847   ALBUMIN 2.8 (L) 05/13/2014 0839   AST 65 (H) 04/24/2023 0847   AST 52 (H) 05/13/2014 0839   ALT 40 04/24/2023 0847   ALT 41 05/13/2014 0839   ALKPHOS 145 (H) 04/24/2023 0847   ALKPHOS 142 05/13/2014 0839   BILITOT 0.7 04/24/2023 0847   BILITOT 0.41 05/13/2014 0839   GFRNONAA >60 04/24/2023 0847   GFRAA  04/13/2010 1417    >60        The eGFR has been calculated using the MDRD equation. This calculation has not been validated in all clinical situations. eGFR's persistently <60 mL/min signify possible Chronic Kidney Disease.     Lab Results  Component Value Date   WBC 3.7 (L) 04/24/2023   NEUTROABS 2.2 04/24/2023   HGB 14.2 04/24/2023   HCT 40.3 04/24/2023   MCV 92.0 04/24/2023   PLT 117 (L) 04/24/2023    Lab Results  Component Value Date   IRON 315 (H) 04/24/2023   TIBC 344 04/24/2023   FERRITIN 100 04/24/2023    Addendum I have  seen the patient, examined him. I agree with the assessment and and plan and have edited the notes.   Patient is clinically doing well, no concerns.  Lab reviewed, today's iron studies still pending, last iron study in August 2024 showed ferritin 36, and transferrin saturation 50%.  Okay to proceed with phlebotomy based on lab results.  However patient would like to wait for today's results, to see if he needs phlebotomy.  He wants to do the lab first in the future then determine if phlebotomy is needed, we will schedule accordingly.  I also encouraged him to continue liver cancer screening with ultrasound and AFP, he will follow-up with his GI doctor.  All questions were answered.  I spent a total of 15 minutes for his visit today, more than 50% time on face-to-face counseling  Malachy Mood MD 04/24/2023

## 2023-04-24 NOTE — Assessment & Plan Note (Addendum)
Treated with therapeutic phlebotomy with following parameters  -ferritin < 50 and transferrin ratio < 50%  -hold phlebotomy for hgb 12 Most recent phlebotomy treatment done 07/24/2022.  04/24/2023 - therapeutic phlebotomy today.

## 2023-04-24 NOTE — Progress Notes (Signed)
Patient scheduled for therapeutic phlebotomy 04/24/2023 at 2pm

## 2023-04-25 ENCOUNTER — Inpatient Hospital Stay: Payer: Medicare HMO

## 2023-04-25 DIAGNOSIS — E861 Hypovolemia: Secondary | ICD-10-CM

## 2023-04-25 NOTE — Patient Instructions (Signed)

## 2023-04-25 NOTE — Progress Notes (Signed)
John [name] presents today for phlebotomy per MD orders. 16g phlebotomy kit used in the LAC.  Phlebotomy procedure started at 1339 and ended at 1347. 503 cc removed. Patient tolerated procedure well. IV needle removed intact.  Pt observed for 30 minutes post phlebotomy procedure. Tolerated well. VSS at time of discharge.

## 2023-04-28 ENCOUNTER — Encounter: Payer: Self-pay | Admitting: Internal Medicine

## 2023-05-16 ENCOUNTER — Other Ambulatory Visit: Payer: Self-pay

## 2023-05-16 ENCOUNTER — Emergency Department (HOSPITAL_BASED_OUTPATIENT_CLINIC_OR_DEPARTMENT_OTHER)
Admission: EM | Admit: 2023-05-16 | Discharge: 2023-05-16 | Disposition: A | Discharge status: Home / Self Care | Payer: Medicare HMO | Attending: Emergency Medicine | Admitting: Emergency Medicine

## 2023-05-16 ENCOUNTER — Encounter (HOSPITAL_BASED_OUTPATIENT_CLINIC_OR_DEPARTMENT_OTHER): Payer: Self-pay

## 2023-05-16 ENCOUNTER — Emergency Department (HOSPITAL_BASED_OUTPATIENT_CLINIC_OR_DEPARTMENT_OTHER): Payer: Medicare HMO

## 2023-05-16 DIAGNOSIS — R519 Headache, unspecified: Secondary | ICD-10-CM | POA: Diagnosis not present

## 2023-05-16 DIAGNOSIS — R42 Dizziness and giddiness: Secondary | ICD-10-CM | POA: Insufficient documentation

## 2023-05-16 DIAGNOSIS — R55 Syncope and collapse: Secondary | ICD-10-CM | POA: Insufficient documentation

## 2023-05-16 DIAGNOSIS — Z794 Long term (current) use of insulin: Secondary | ICD-10-CM | POA: Diagnosis not present

## 2023-05-16 DIAGNOSIS — M791 Myalgia, unspecified site: Secondary | ICD-10-CM

## 2023-05-16 LAB — CBC
HCT: 39.1 % (ref 39.0–52.0)
Hemoglobin: 13.6 g/dL (ref 13.0–17.0)
MCH: 32.6 pg (ref 26.0–34.0)
MCHC: 34.8 g/dL (ref 30.0–36.0)
MCV: 93.8 fL (ref 80.0–100.0)
Platelets: 146 10*3/uL — ABNORMAL LOW (ref 150–400)
RBC: 4.17 MIL/uL — ABNORMAL LOW (ref 4.22–5.81)
RDW: 14.4 % (ref 11.5–15.5)
WBC: 5.8 10*3/uL (ref 4.0–10.5)
nRBC: 0 % (ref 0.0–0.2)

## 2023-05-16 LAB — BASIC METABOLIC PANEL
Anion gap: 10 (ref 5–15)
BUN: 11 mg/dL (ref 8–23)
CO2: 23 mmol/L (ref 22–32)
Calcium: 8.8 mg/dL — ABNORMAL LOW (ref 8.9–10.3)
Chloride: 103 mmol/L (ref 98–111)
Creatinine, Ser: 0.76 mg/dL (ref 0.61–1.24)
GFR, Estimated: >60 mL/min (ref >60)
Glucose, Bld: 290 mg/dL — ABNORMAL HIGH (ref 70–99)
Potassium: 4.3 mmol/L (ref 3.5–5.1)
Sodium: 136 mmol/L (ref 135–145)

## 2023-05-16 LAB — URINALYSIS, ROUTINE W REFLEX MICROSCOPIC
Bacteria, UA: NONE SEEN
Bilirubin Urine: NEGATIVE
Glucose, UA: >1000 mg/dL — AB
Hgb urine dipstick: NEGATIVE
Ketones, ur: 40 mg/dL — AB
Leukocytes,Ua: NEGATIVE
Nitrite: NEGATIVE
Protein, ur: NEGATIVE mg/dL
Specific Gravity, Urine: 1.015 (ref 1.005–1.030)
pH: 6.5 (ref 5.0–8.0)

## 2023-05-16 LAB — CBG MONITORING, ED
Glucose-Capillary: 282 mg/dL — ABNORMAL HIGH (ref 70–99)
Glucose-Capillary: 298 mg/dL — ABNORMAL HIGH (ref 70–99)
Glucose-Capillary: 291 mg/dL — ABNORMAL HIGH (ref 70–99)

## 2023-05-16 LAB — CK: Total CK: 309 U/L (ref 49–397)

## 2023-05-16 MED ORDER — IBUPROFEN 400 MG PO TABS
400.0000 mg | ORAL_TABLET | Freq: Once | ORAL | Status: AC
  Administered 2023-05-16: 400 mg via ORAL
  Filled 2023-05-16: qty 1

## 2023-05-16 MED ORDER — INSULIN ASPART PROT & ASPART (70-30 MIX) 100 UNIT/ML ~~LOC~~ SUSP
6.0000 [IU] | Freq: Once | SUBCUTANEOUS | Status: AC
  Administered 2023-05-16: 6 [IU] via SUBCUTANEOUS
  Filled 2023-05-16: qty 6

## 2023-05-16 MED ORDER — ONDANSETRON 4 MG PO TBDP
4.0000 mg | ORAL_TABLET | Freq: Once | ORAL | Status: AC
  Administered 2023-05-16: 4 mg via ORAL
  Filled 2023-05-16: qty 1

## 2023-05-16 MED ORDER — ACETAMINOPHEN 500 MG PO TABS: 1000.0000 mg | ORAL_TABLET | Freq: Once | ORAL | Status: DC

## 2023-05-16 NOTE — ED Triage Notes (Signed)
Pt c/o "severe leg cramps" onset approx 3a. Partner states that he "gets locked up & went down. Our daughter states his pulse was racing but it took him awhile to come around. He also peed on himself so I'm not sure what happened, but it took him a couple hours to come all the way around."   Denies known seizure hx, "but he has passed out before."

## 2023-05-16 NOTE — ED Provider Notes (Signed)
[place] EMERGENCY DEPARTMENT AT [facility] Provider Note   CSN: [id number] Arrival date & time: 05/16/23  1236     History  Chief Complaint  Patient presents with   Loss of Consciousness   Headache    John [name] is a 67 y.o. male.  HPI    67 year old male comes in with chief complaint of loss of consciousness, headache.  Patient accompanied by his partner who provides history.  Patient states that John [name] has history of liver disease and has had some trouble cramping for a long time.  Today John [name] was having cramping while John [name] was laying down, which led to him standing up.  According to the significant other, at that time patient started crying for help and she went to assist him.  Patient then however lost tone of his body and fell and had a blank stare for 3 minutes.  During that time patient was not responding.  His eyes did not roll back.  There was no seizure-like activity.  Patient does not recall this episode.  The next and John [name] remembers his daughter was yelling for help.  Patient proceeded to go into a shower -which typically helps him with cramps.  Ultimately decided to come to the ER.  No new medications.  No history of seizures.  From the fall, patient is having posterior headache.  Patient denies any known history of cardiac disease history and had no chest pain, palpitations, dizziness, shortness of breath.  Home Medications Prior to Admission medications   Medication Sig Start Date End Date Taking? Authorizing Provider  NOVOLOG FLEXPEN 100 UNIT/ML FlexPen See admin instructions.  INJECT 4 UNITS SUBCUTANEOUSLY THREE TIMES DAILY PLUS SLIDING SCALE BEFORE MEALS. TDD = 30 UNITS 04/23/22  Yes [provider]  sildenafil (REVATIO) 20 MG tablet See admin instructions. Take 1-5 tabs at least 45 minutes before activity PRN 03/21/23  Yes [provider]  ACCU-CHEK AVIVA PLUS test strip SMARTSIG:1 Strip(s) Via Meter 5 Times Daily 06/09/20   [provider]  albuterol (VENTOLIN HFA) 108 (90 Base) MCG/ACT inhaler albuterol sulfate HFA 90 mcg/actuation aerosol inhaler  INHALE 2 PUFFS BY MOUTH EVERY 4 HOURS AS NEEDED FOR WHEEZING 07/01/19   [provider]  azelastine (ASTELIN) 0.1 % nasal spray azelastine 137 mcg (0.1 %) nasal spray aerosol  USE 2 SPRAY(S) IN EACH NOSTRIL 4 TIMES DAILY FOR 3 5 DAYS . THEN USE TWICE DAILY    [provider]  Continuous Blood Gluc Receiver (FREESTYLE LIBRE 2 READER) DEVI Scan as needed for continuous glucose monitoring. 06/15/20   [provider]  Continuous Blood Gluc Sensor (FREESTYLE LIBRE 2 SENSOR) MISC Apply topically. 06/15/20   [provider]  gabapentin (NEURONTIN) 100 MG capsule Take 1 capsule by mouth 3 (three) times daily. 03/30/14   [provider]  gabapentin (NEURONTIN) 300 MG capsule Take 1 capsule by mouth at bedtime. 08/09/17   [provider]  glucagon 1 MG injection Inject 1 mg into the vein once as needed.    [provider]  Glucagon 3 MG/DOSE POWD Place into the nose. 01/11/21   [provider]  insulin aspart (NOVOLOG) 100 unit/mL injection Inject into the skin.    [provider]  insulin glargine (LANTUS) 100 UNIT/ML injection Inject 30 Units into the skin daily.    [provider]  Insulin Pen Needle 31G X 8 MM MISC Injects 4 times daily 02/17/14   [provider]  ketoconazole (NIZORAL) 2 %  cream Apply 1 application topically daily. 07/05/18   Felecia Shelling, [name]  lactulose (CHRONULAC) 10 GM/15ML solution as needed. 08/10/19   [provider]      Allergies    Sulfa antibiotics, Sulfamethoxazole, and Testosterone    Review of Systems   Review of Systems  All other systems reviewed and are negative.   Physical Exam Updated Vital Signs BP (!) 143/74   Pulse 87   Temp 98.2 F (36.8 C) (Oral)   Resp 15   SpO2 93%  Physical Exam Vitals and nursing note reviewed.   Constitutional:      Appearance: John [name] is well-developed.  HENT:     Head: Atraumatic.  Eyes:     Extraocular Movements: Extraocular movements intact.     Pupils: Pupils are equal, round, and reactive to light.     Comments: No nystagmus  Cardiovascular:     Rate and Rhythm: Normal rate.  Pulmonary:     Effort: Pulmonary effort is normal.  Musculoskeletal:     Cervical back: Neck supple.  Skin:    General: Skin is warm.  Neurological:     Mental Status: John [name] is alert and oriented to person, place, and time.     GCS: GCS eye subscore is 4. GCS verbal subscore is 5. GCS motor subscore is 6.     Cranial Nerves: No cranial nerve deficit, dysarthria or facial asymmetry.     Sensory: No sensory deficit.     Motor: No weakness.     ED Results / Procedures / Treatments   Labs (all labs ordered are listed, but only abnormal results are displayed) Labs Reviewed  BASIC METABOLIC PANEL - Abnormal; Notable for the following components:      Result Value   Glucose, Bld 290 (*)    Calcium 8.8 (*)    All other components within normal limits  CBC - Abnormal; Notable for the following components:   RBC 4.17 (*)    Platelets 146 (*)    All other components within normal limits  URINALYSIS, ROUTINE W REFLEX MICROSCOPIC - Abnormal; Notable for the following components:   Glucose, UA >1,000 (*)    Ketones, ur 40 (*)    All other components within normal limits  CBG MONITORING, ED - Abnormal; Notable for the following components:   Glucose-Capillary 282 (*)    All other components within normal limits  CBG MONITORING, ED - Abnormal; Notable for the following components:   Glucose-Capillary 298 (*)    All other components within normal limits  CBG MONITORING, ED - Abnormal; Notable for the following components:   Glucose-Capillary 291 (*)    All other components within normal limits  CK    EKG EKG Interpretation Date/Time:  Thursday May 16 2023 13:02:43 EST Ventricular Rate:   94 PR Interval:  146 QRS Duration:  70 QT Interval:  362 QTC Calculation: 452 R Axis:   88  Text Interpretation: Normal sinus rhythm Nonspecific ST abnormality Abnormal ECG No previous ECGs available No acute changes No significant change since last tracing Confirmed by Derwood Kaplan [phone]) on 05/16/2023 3:15:43 PM  Radiology CT HEAD WO CONTRAST  Result Date: 05/16/2023 CLINICAL DATA:  Syncope EXAM: CT HEAD WITHOUT CONTRAST TECHNIQUE: Contiguous axial images were obtained from the base of the skull through the vertex without intravenous contrast. RADIATION DOSE REDUCTION: This exam was performed according to the departmental dose-optimization program which includes automated exposure control, adjustment of the mA and/or kV according to patient size  and/or use of iterative reconstruction technique. COMPARISON:  MRI 09/21/2013, head CT 05/02/2009 FINDINGS: Brain: No acute territorial infarction, hemorrhage or intracranial mass. Patchy white matter hypodensity likely chronic small vessel ischemic change. Nonenlarged ventricles Vascular: No hyperdense vessels.  No unexpected calcification Skull: Normal. Negative for fracture or focal lesion. Sinuses/Orbits: No acute finding. Other: None IMPRESSION: 1. No CT evidence for acute intracranial abnormality. 2. Patchy white matter hypodensity likely chronic small vessel ischemic change. Electronically Signed   By: Jasmine Pang M.D.   On: 05/16/2023 15:30    Procedures Procedures    Medications Ordered in ED Medications  ibuprofen (ADVIL) tablet 400 mg (400 mg Oral Given 05/16/23 1630)  insulin aspart protamine- aspart (NOVOLOG MIX 70/30) injection 6 Units (6 Units Subcutaneous Given 05/16/23 1631)  ondansetron (ZOFRAN-ODT) disintegrating tablet 4 mg (4 mg Oral Given 05/16/23 1734)    ED Course/ Medical Decision Making/ A&P Clinical Course as of 05/17/23 0735  Thu May 16, 2023  1539 Stable HO from AN Syncope. 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  Syncopal event and incontinence today. CK pending. [CC]  1557 CT HEAD WO CONTRAST [CC]    Clinical Course User Index [CC] Glyn Ade, MD                                 Medical Decision Making Amount and/or Complexity of Data Reviewed Labs: ordered. Radiology: ordered. Decision-making details documented in ED Course.  Risk Prescription drug management.   67 year old male comes in with chief complaint of unresponsive episode. John [name] has past medical history of diabetes, hyperlipidemia, thrombocytopenia, liver cirrhosis.  John [name] also has diffuse and intermittent episodes of cramping.  No history of seizures.  It is unclear what type of unresponsive episode patient had. It appears to me that patient likely had a syncopal episode, however absent seizure is also a possibility.  Nonepileptic seizure is another possibility.  Patient has significant medical comorbidities and complains of significant cramping, dehydration therefore we will get some basic labs including CK and also get CT scan of the brain to ensure there is no evidence of brain bleed from the fall.  Oral hydration initiated.  If the lab workup is reassuring and patient passes oral challenge then John [name] can be discharged with outpatient follow-up with PCP.  Final Clinical Impression(s) / ED Diagnoses Final diagnoses:  Muscle pain  Syncope, unspecified syncope type  Vertigo    Rx / DC Orders ED Discharge Orders          Ordered    Ambulatory referral to Neurology       Comments: An appointment is requested in approximately: 1 week   05/16/23 1719              Derwood Kaplan, MD 05/17/23 [phone]

## 2023-05-16 NOTE — ED Notes (Signed)
Patient is in the restroom at this time.  

## 2023-05-16 NOTE — ED Provider Notes (Signed)
Care of patient received from prior provider at 5:18 PM, please see their note for complete H/P and care plan.  Received handoff per ED course.  Clinical Course as of 05/16/23 1718  Thu May 16, 2023  1539 Stable HO from AN Syncope. 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 Syncopal event and incontinence today. CK pending. [CC]  1557 CT HEAD WO CONTRAST [CC]    Clinical Course User Index [CC] Glyn Ade, MD    Reassessment: CK negative.  Imaging without focal pathology.  He was given his home insulin and blood sugar is stable on serial checks.  He is stable for outpatient care management follow-up with his PCP.  Will also refer to neurology as he has been lost to follow-up and was being worked up for alternative etiology.  Disposition:  I have considered need for hospitalization, however, considering all of the above, I believe this patient is stable for discharge at this time.  Patient/family educated about specific return precautions for given chief complaint and symptoms.  Patient/family educated about follow-up with PCP.     Patient/family expressed understanding of return precautions and need for follow-up. Patient spoken to regarding all imaging and laboratory results and appropriate follow up for these results. All education provided in verbal form with additional information in written form. Time was allowed for answering of patient questions. Patient discharged.    Emergency Department Medication Summary:   Medications  ibuprofen (ADVIL) tablet 400 mg (400 mg Oral Given 05/16/23 1630)  insulin aspart protamine- aspart (NOVOLOG MIX 70/30) injection 6 Units (6 Units Subcutaneous Given 05/16/23 1631)            Glyn Ade, MD 05/16/23 1719

## 2023-05-16 NOTE — ED Notes (Signed)
Pt provided Pedialyte per rehydration protocol. Pt concerned about blood sugar, EDP notified and additional medications orders received.

## 2023-05-16 NOTE — ED Notes (Signed)
Pt tolerated PO challenge appropriately. Pt experiencing some nausea. MD notified and further medication orders placed. Pt in NAD.

## 2023-05-16 NOTE — ED Notes (Signed)
Still waiting on a urine sample from patient at this time.

## 2023-05-20 ENCOUNTER — Encounter: Payer: Self-pay | Admitting: Internal Medicine

## 2023-05-28 ENCOUNTER — Ambulatory Visit (HOSPITAL_COMMUNITY)
Admission: RE | Admit: 2023-05-28 | Discharge: 2023-05-28 | Disposition: A | Discharge status: Home / Self Care | Payer: Medicare HMO | Source: Ambulatory Visit | Attending: Nurse Practitioner | Admitting: Nurse Practitioner

## 2023-05-28 DIAGNOSIS — K7469 Other cirrhosis of liver: Secondary | ICD-10-CM | POA: Diagnosis present

## 2023-08-29 ENCOUNTER — Ambulatory Visit: Payer: Medicare HMO | Admitting: Neurology

## 2023-10-23 ENCOUNTER — Telehealth: Payer: Self-pay | Admitting: Hematology

## 2023-10-23 NOTE — Telephone Encounter (Signed)
 Al was re-scheduled due to MD emergency.

## 2023-10-24 ENCOUNTER — Emergency Department (HOSPITAL_COMMUNITY)
Admission: EM | Admit: 2023-10-24 | Discharge: 2023-10-24 | Disposition: A | Discharge status: Home / Self Care | Attending: Emergency Medicine | Admitting: Emergency Medicine

## 2023-10-24 ENCOUNTER — Other Ambulatory Visit: Payer: Self-pay

## 2023-10-24 ENCOUNTER — Emergency Department (HOSPITAL_COMMUNITY)

## 2023-10-24 ENCOUNTER — Inpatient Hospital Stay: Admitting: Hematology

## 2023-10-24 ENCOUNTER — Telehealth: Payer: Self-pay | Admitting: Nurse Practitioner

## 2023-10-24 ENCOUNTER — Inpatient Hospital Stay: Attending: Hematology

## 2023-10-24 ENCOUNTER — Inpatient Hospital Stay

## 2023-10-24 DIAGNOSIS — R0781 Pleurodynia: Secondary | ICD-10-CM | POA: Diagnosis present

## 2023-10-24 DIAGNOSIS — K7469 Other cirrhosis of liver: Secondary | ICD-10-CM

## 2023-10-24 DIAGNOSIS — W19XXXA Unspecified fall, initial encounter: Secondary | ICD-10-CM | POA: Diagnosis not present

## 2023-10-24 DIAGNOSIS — Z794 Long term (current) use of insulin: Secondary | ICD-10-CM | POA: Diagnosis not present

## 2023-10-24 DIAGNOSIS — Z79899 Other long term (current) drug therapy: Secondary | ICD-10-CM | POA: Diagnosis not present

## 2023-10-24 DIAGNOSIS — S2242XA Multiple fractures of ribs, left side, initial encounter for closed fracture: Secondary | ICD-10-CM | POA: Insufficient documentation

## 2023-10-24 DIAGNOSIS — R0600 Dyspnea, unspecified: Secondary | ICD-10-CM | POA: Insufficient documentation

## 2023-10-24 LAB — CMP (CANCER CENTER ONLY)
ALT: 41 U/L (ref 0–44)
AST: 55 U/L — ABNORMAL HIGH (ref 15–41)
Albumin: 3.5 g/dL (ref 3.5–5.0)
Alkaline Phosphatase: 162 U/L — ABNORMAL HIGH (ref 38–126)
Anion gap: 8 (ref 5–15)
BUN: 10 mg/dL (ref 8–23)
CO2: 24 mmol/L (ref 22–32)
Calcium: 8.9 mg/dL (ref 8.9–10.3)
Chloride: 104 mmol/L (ref 98–111)
Creatinine: 0.77 mg/dL (ref 0.61–1.24)
GFR, Estimated: >60 mL/min (ref >60)
Glucose, Bld: 139 mg/dL — ABNORMAL HIGH (ref 70–99)
Potassium: 3.9 mmol/L (ref 3.5–5.1)
Sodium: 136 mmol/L (ref 135–145)
Total Bilirubin: 0.9 mg/dL (ref 0.0–1.2)
Total Protein: 7.6 g/dL (ref 6.5–8.1)

## 2023-10-24 LAB — CBC WITH DIFFERENTIAL (CANCER CENTER ONLY)
Abs Immature Granulocytes: 0.03 10*3/uL (ref 0.00–0.07)
Basophils Absolute: 0 10*3/uL (ref 0.0–0.1)
Basophils Relative: 0 %
Eosinophils Absolute: 0.4 10*3/uL (ref 0.0–0.5)
Eosinophils Relative: 7 %
HCT: 43.7 % (ref 39.0–52.0)
Hemoglobin: 15.5 g/dL (ref 13.0–17.0)
Immature Granulocytes: 1 %
Lymphocytes Relative: 13 %
Lymphs Abs: 0.7 10*3/uL (ref 0.7–4.0)
MCH: 32.6 pg (ref 26.0–34.0)
MCHC: 35.5 g/dL (ref 30.0–36.0)
MCV: 92 fL (ref 80.0–100.0)
Monocytes Absolute: 0.5 10*3/uL (ref 0.1–1.0)
Monocytes Relative: 9 %
Neutro Abs: 4.1 10*3/uL (ref 1.7–7.7)
Neutrophils Relative %: 70 %
Platelet Count: 137 10*3/uL — ABNORMAL LOW (ref 150–400)
RBC: 4.75 MIL/uL (ref 4.22–5.81)
RDW: 14 % (ref 11.5–15.5)
WBC Count: 5.8 10*3/uL (ref 4.0–10.5)
nRBC: 0 % (ref 0.0–0.2)

## 2023-10-24 LAB — IRON AND IRON BINDING CAPACITY (CC-WL,HP ONLY)
Iron: 329 ug/dL — ABNORMAL HIGH (ref 45–182)
Saturation Ratios: 94 % — ABNORMAL HIGH (ref 17.9–39.5)
TIBC: 351 ug/dL (ref 250–450)
UIBC: 22 ug/dL — ABNORMAL LOW (ref 117–376)

## 2023-10-24 LAB — FERRITIN: Ferritin: 164 ng/mL (ref 24–336)

## 2023-10-24 MED ORDER — ACETAMINOPHEN 500 MG PO TABS
1000.0000 mg | ORAL_TABLET | Freq: Once | ORAL | Status: AC
  Administered 2023-10-24: 1000 mg via ORAL
  Filled 2023-10-24: qty 2

## 2023-10-24 MED ORDER — DICLOFENAC EPOLAMINE 1.3 % EX PTCH
1.0000 | MEDICATED_PATCH | Freq: Two times a day (BID) | CUTANEOUS | Status: DC
  Administered 2023-10-24: 1 via TRANSDERMAL
  Filled 2023-10-24: qty 1

## 2023-10-24 MED ORDER — HYDROCODONE-ACETAMINOPHEN 5-325 MG PO TABS: 1.0000 | ORAL_TABLET | Freq: Four times a day (QID) | ORAL | 0 refills | Status: AC | PRN

## 2023-10-24 MED ORDER — DICLOFENAC EPOLAMINE 1.3 % EX PTCH: 1.0000 | MEDICATED_PATCH | Freq: Two times a day (BID) | CUTANEOUS | 1 refills | Status: AC

## 2023-10-24 NOTE — Telephone Encounter (Signed)
 Was asked to see patient in infusion today. Was here to have labs drawn and possible phlebotomy treatment. Reported that he fell at home yesterday morning. Slipped outside due to rainy and muddy conditions. Hit his left side on his truck. Knocked the wind out of himself. Having pain 8/10 in severity. Movement is difficult due to pain. Mild bruising noted along the left posterior and lateral ribs. Increased pain with minimal palpation. Patient advised to be seen in the ED for further evaluation. Patient was agreeable. Will go to ED via wheelchair. Charge nurse in ED to be notified of patient's arrival and situation.  -Rande Bushy, NP

## 2023-10-24 NOTE — Discharge Instructions (Addendum)
 Please be sure to follow-up with your physician for ongoing monitoring of your rib fractures.  Return here for concerning changes in your condition.

## 2023-10-24 NOTE — Progress Notes (Signed)
 Pt came in c/o left sided rib pain at an 8/10 and decreased ROM in left arm. Pt reported having a "severe fall" yesterday, was on the ground for 45 minutes. Heather PA called to chairside to assess pt. Pt transported to ED in wheelchair for workup.

## 2023-10-24 NOTE — Assessment & Plan Note (Deleted)
 Treated with therapeutic phlebotomy with following parameters  -ferritin < 50 and transferrin ratio < 50%  -hold phlebotomy for hgb 12 Most recent phlebotomy treatment done 07/24/2022.  04/24/2023 - therapeutic phlebotomy today.

## 2023-10-24 NOTE — ED Provider Notes (Signed)
 [place] EMERGENCY DEPARTMENT AT [facility] Provider Note   CSN: [id number] Arrival date & time: 10/24/23  4098     History  Chief Complaint  Patient presents with   Fall   Rib Injury    John [name] is a 68 y.o. male.  HPI Presents with left rib pain.  He had a fall 2 days ago, landing on his left side since that time had severe pain in the left axilla.  There is associated difficulty breathing secondary to pain, moving his left arm secondary pain, no shoulder pain, elbow pain, wrist pain or other complaints.  Minimal relief with OTC meds yesterday.  No other injuries, no other complaints.    Home Medications Prior to Admission medications   Medication Sig Start Date End Date Taking? Authorizing Provider  diclofenac (FLECTOR) 1.3 % PTCH Place 1 patch onto the skin 2 (two) times daily. 10/24/23  Yes Dorenda Gandy, MD  HYDROcodone-acetaminophen  (NORCO/VICODIN) 5-325 MG tablet Take 1 tablet by mouth every 6 (six) hours as needed. 10/24/23  Yes Dorenda Gandy, MD  ACCU-CHEK AVIVA PLUS test strip SMARTSIG:1 Strip(s) Via Meter 5 Times Daily 06/09/20   [provider]  albuterol (VENTOLIN HFA) 108 (90 Base) MCG/ACT inhaler albuterol sulfate HFA 90 mcg/actuation aerosol inhaler  INHALE 2 PUFFS BY MOUTH EVERY 4 HOURS AS NEEDED FOR WHEEZING 07/01/19   [provider]  azelastine (ASTELIN) 0.1 % nasal spray azelastine 137 mcg (0.1 %) nasal spray aerosol  USE 2 SPRAY(S) IN EACH NOSTRIL 4 TIMES DAILY FOR 3 5 DAYS . THEN USE TWICE DAILY    [provider]  Continuous Blood Gluc Receiver (FREESTYLE LIBRE 2 READER) DEVI Scan as needed for continuous glucose monitoring. 06/15/20   [provider]  Continuous Blood Gluc Sensor (FREESTYLE LIBRE 2 SENSOR) MISC Apply topically. 06/15/20   [provider]  gabapentin  (NEURONTIN ) 100 MG capsule Take 1 capsule by mouth 3 (three) times daily. 03/30/14   [provider]  gabapentin   (NEURONTIN ) 300 MG capsule Take 1 capsule by mouth at bedtime. 08/09/17   [provider]  glucagon 1 MG injection Inject 1 mg into the vein once as needed.    [provider]  Glucagon 3 MG/DOSE POWD Place into the nose. 01/11/21   [provider]  insulin  aspart (NOVOLOG ) 100 unit/mL injection Inject into the skin.    [provider]  insulin  glargine (LANTUS) 100 UNIT/ML injection Inject 30 Units into the skin daily.    [provider]  Insulin  Pen Needle 31G X 8 MM MISC Injects 4 times daily 02/17/14   [provider]  ketoconazole  (NIZORAL ) 2 % cream Apply 1 application topically daily. 07/05/18   Dot Gazella, [name]  lactulose (CHRONULAC) 10 GM/15ML solution as needed. 08/10/19   [provider]  NOVOLOG  FLEXPEN 100 UNIT/ML FlexPen See admin instructions.  INJECT 4 UNITS SUBCUTANEOUSLY THREE TIMES DAILY PLUS SLIDING SCALE BEFORE MEALS. TDD = 30 UNITS 04/23/22   [provider]  sildenafil (REVATIO) 20 MG tablet See admin instructions. Take 1-5 tabs at least 45 minutes before activity PRN 03/21/23   [provider]      Allergies    Sulfa antibiotics, Sulfamethoxazole, and Testosterone    Review of Systems   Review of Systems  Physical Exam Updated Vital Signs BP (!) 161/70 (BP Location: Left Arm)   Pulse 88   Temp 98 F (36.7 C) (Oral)   Resp 16   Ht 6' (1.829  m)   Wt 78 kg   SpO2 99%   BMI 23.32 kg/m  Physical Exam Vitals and nursing note reviewed.  Constitutional:      General: He is not in acute distress.    Appearance: He is well-developed.  HENT:     Head: Normocephalic and atraumatic.  Eyes:     Conjunctiva/sclera: Conjunctivae normal.  Cardiovascular:     Rate and Rhythm: Normal rate and regular rhythm.     Pulses: Normal pulses.  Pulmonary:     Effort: Pulmonary effort is normal. No respiratory distress.     Breath sounds: No stridor.  Abdominal:     General: There is no distension.   Musculoskeletal:       Arms:  Skin:    General: Skin is warm and dry.  Neurological:     Mental Status: He is alert and oriented to person, place, and time.     ED Results / Procedures / Treatments   Labs (all labs ordered are listed, but only abnormal results are displayed) Labs Reviewed - No data to display  EKG None  Radiology DG Ribs Unilateral W/Chest Left Result Date: 10/24/2023 CLINICAL DATA:  Chest pain EXAM: LEFT RIBS AND CHEST - 3+ VIEW COMPARISON:  February 20, 2016 . FINDINGS: fracture of the left seventh, eighth left posterolateral ribs with mild displacement of the left eighth rib fracture. No infiltrates or pneumothorax IMPRESSION: Rib fractures as described Electronically Signed   By: Fredrich Jefferson M.D.   On: 10/24/2023 09:52    Procedures Procedures    Medications Ordered in ED Medications  diclofenac (FLECTOR) 1.3 % 1 patch (has no administration in time range)  acetaminophen  (TYLENOL ) tablet 1,000 mg (1,000 mg Oral Given 10/24/23 0941)    ED Course/ Medical Decision Making/ A&P                                 Medical Decision Making Patient presents with axillary pain, no respiratory distress, but concern rib fracture versus pneumothorax following a fall.  Pulse ox 99%.  Amount and/or Complexity of Data Reviewed Radiology: ordered and independent interpretation performed. Decision-making details documented in ED Course.  Risk OTC drugs. Prescription drug management.   10:29 AM Patient awake, alert, in no distress, now accompanied by his daughter.  We reviewed the findings from x-ray which I have reviewed, 2 rib fractures, no pneumothorax.  Passage of 2 days, reassuring vitals both reassuring, low suspicion for other acute injuries, patient comfortable with analgesics, discharge, primary care follow-up.        Final Clinical Impression(s) / ED Diagnoses Final diagnoses:  Fall, initial encounter  Closed fracture of multiple ribs of left  side, initial encounter    Rx / DC Orders ED Discharge Orders          Ordered    diclofenac (FLECTOR) 1.3 % PTCH  2 times daily        10/24/23 1029    HYDROcodone-acetaminophen  (NORCO/VICODIN) 5-325 MG tablet  Every 6 hours PRN        10/24/23 1029              Dorenda Gandy, MD 10/24/23 1029

## 2023-10-24 NOTE — ED Triage Notes (Signed)
 Pt fell yesterday and landed on his left side. Pt complaining of right rib pain. Pt is unable to rase his right arm due to the pain. Pt states the pain is 8/10.

## 2023-10-24 NOTE — Progress Notes (Deleted)
 Patient Care Team: Yvonnie Heritage, FNP as PCP - General (Nurse Practitioner) Derenda Flax, MD as Referring Physician (Internal Medicine) Sonja [place], MD as Attending Physician (Hematology and Oncology)  Clinic Day:  10/24/2023  Referring physician: Yvonnie Heritage, FNP  ASSESSMENT & PLAN:   Assessment & Plan: No problem-specific Assessment & Plan notes found for this encounter.    The patient understands the plans discussed today and is in agreement with them.  He knows to contact our office if he develops concerns prior to his next appointment.  I provided *** minutes of face-to-face time during this encounter and > 50% was spent counseling as documented under my assessment and plan.    Sharyon Deis, NP  [facility] - A DEPT OF [facility] [address] [place] [address] Dept: [phone] Dept Fax: [phone]   No orders of the defined types were placed in this encounter.     CHIEF COMPLAINT:  CC: hereditary hemochromatosis   Current Treatment:  therapeutic phlebotomy prn with goal of ferritin <50 and transferrin sat : 50%. Hold if hgb < 12.  INTERVAL HISTORY:  John [name] is here today for repeat clinical assessment. He was last evaluated on 04/24/2023. That was his most recent therapeutic phlebotomy treatment.  He denies fevers or chills. He denies pain. His appetite is good. His weight {Weight change:10426}.  I have reviewed the past medical history, past surgical history, social history and family history with the patient and they are unchanged from previous note.  ALLERGIES:  is allergic to sulfa antibiotics, sulfamethoxazole, and testosterone.  MEDICATIONS:  Current Outpatient Medications  Medication Sig Dispense Refill   ACCU-CHEK AVIVA PLUS test strip SMARTSIG:1 Strip(s) Via Meter 5 Times Daily     albuterol (VENTOLIN HFA) 108 (90 Base) MCG/ACT inhaler albuterol sulfate HFA 90  mcg/actuation aerosol inhaler  INHALE 2 PUFFS BY MOUTH EVERY 4 HOURS AS NEEDED FOR WHEEZING     azelastine (ASTELIN) 0.1 % nasal spray azelastine 137 mcg (0.1 %) nasal spray aerosol  USE 2 SPRAY(S) IN EACH NOSTRIL 4 TIMES DAILY FOR 3 5 DAYS . THEN USE TWICE DAILY     Continuous Blood Gluc Receiver (FREESTYLE LIBRE 2 READER) DEVI Scan as needed for continuous glucose monitoring.     Continuous Blood Gluc Sensor (FREESTYLE LIBRE 2 SENSOR) MISC Apply topically.     diclofenac (FLECTOR) 1.3 % PTCH Place 1 patch onto the skin 2 (two) times daily. 5 patch 1   gabapentin  (NEURONTIN ) 100 MG capsule Take 1 capsule by mouth 3 (three) times daily.     gabapentin  (NEURONTIN ) 300 MG capsule Take 1 capsule by mouth at bedtime.     glucagon 1 MG injection Inject 1 mg into the vein once as needed.     Glucagon 3 MG/DOSE POWD Place into the nose.     HYDROcodone-acetaminophen  (NORCO/VICODIN) 5-325 MG tablet Take 1 tablet by mouth every 6 (six) hours as needed. 12 tablet 0   insulin  aspart (NOVOLOG ) 100 unit/mL injection Inject into the skin.     insulin  glargine (LANTUS) 100 UNIT/ML injection Inject 30 Units into the skin daily.     Insulin  Pen Needle 31G X 8 MM MISC Injects 4 times daily     ketoconazole  (NIZORAL ) 2 % cream Apply 1 application topically daily. 60 g 2   lactulose (CHRONULAC) 10 GM/15ML solution as needed.     NOVOLOG  FLEXPEN 100 UNIT/ML FlexPen See admin instructions.  INJECT 4 UNITS SUBCUTANEOUSLY THREE TIMES DAILY PLUS SLIDING SCALE BEFORE MEALS. TDD = 30 UNITS     sildenafil (REVATIO) 20 MG tablet See admin instructions. Take 1-5 tabs at least 45 minutes before activity PRN     No current facility-administered medications for this visit.    HISTORY OF PRESENT ILLNESS:   Oncology History   No history exists.      REVIEW OF SYSTEMS:   Constitutional: Denies fevers, chills or abnormal weight loss Eyes: Denies blurriness of vision Ears, nose, mouth, throat, and face: Denies mucositis  or sore throat Respiratory: Denies cough, dyspnea or wheezes Cardiovascular: Denies palpitation, chest discomfort or lower extremity swelling Gastrointestinal:  Denies nausea, heartburn or change in bowel habits Skin: Denies abnormal skin rashes Lymphatics: Denies new lymphadenopathy or easy bruising Neurological:Denies numbness, tingling or new weaknesses Behavioral/Psych: Mood is stable, no new changes  All other systems were reviewed with the patient and are negative.   VITALS:   There were no vitals taken for this visit.  Wt Readings from Last 3 Encounters:  10/24/23 171 lb 15.3 oz (78 kg)  04/24/23 165 lb 14.4 oz (75.3 kg)  04/20/22 166 lb 6.4 oz (75.5 kg)    There is no height or weight on file to calculate BMI.  Performance status (ECOG): {CHL ONC [id number]  PHYSICAL EXAM:   GENERAL:alert, no distress and comfortable SKIN: skin color, texture, turgor are normal, no rashes or significant lesions EYES: normal, Conjunctiva are pink and non-injected, sclera clear OROPHARYNX:no exudate, no erythema and lips, buccal mucosa, and tongue normal  NECK: supple, thyroid normal size, non-tender, without nodularity LYMPH:  no palpable lymphadenopathy in the cervical, axillary or inguinal LUNGS: clear to auscultation and percussion with normal breathing effort HEART: regular rate & rhythm and no murmurs and no lower extremity edema ABDOMEN:abdomen soft, non-tender and normal bowel sounds Musculoskeletal:no cyanosis of digits and no clubbing  NEURO: alert & oriented x 3 with fluent speech, no focal motor/sensory deficits  LABORATORY DATA:  I have reviewed the data as listed    Component Value Date/Time   NA 136 10/24/2023 0818   NA 138 05/13/2014 0839   K 3.9 10/24/2023 0818   K 4.0 05/13/2014 0839   CL 104 10/24/2023 0818   CO2 24 10/24/2023 0818   CO2 29 05/13/2014 0839   GLUCOSE 139 (H) 10/24/2023 0818   GLUCOSE 177 (H) 05/13/2014 0839   BUN 10 10/24/2023 0818   BUN  10.8 05/13/2014 0839   CREATININE 0.77 10/24/2023 0818   CREATININE 0.8 05/13/2014 0839   CALCIUM 8.9 10/24/2023 0818   CALCIUM 9.1 05/13/2014 0839   PROT 7.6 10/24/2023 0818   PROT 7.5 05/13/2014 0839   ALBUMIN 3.5 10/24/2023 0818   ALBUMIN 2.8 (L) 05/13/2014 0839   AST 55 (H) 10/24/2023 0818   AST 52 (H) 05/13/2014 0839   ALT 41 10/24/2023 0818   ALT 41 05/13/2014 0839   ALKPHOS 162 (H) 10/24/2023 0818   ALKPHOS 142 05/13/2014 0839   BILITOT 0.9 10/24/2023 0818   BILITOT 0.41 05/13/2014 0839   GFRNONAA >60 10/24/2023 0818   GFRAA  04/13/2010 1417    >60        The eGFR has been calculated using the MDRD equation. This calculation has not been validated in all clinical situations. eGFR's persistently <60 mL/min signify possible Chronic Kidney Disease.    No results found for: "SPEP", "UPEP"  Lab Results  Component Value Date   WBC 5.8 10/24/2023  NEUTROABS 4.1 10/24/2023   HGB 15.5 10/24/2023   HCT 43.7 10/24/2023   MCV 92.0 10/24/2023   PLT 137 (L) 10/24/2023      Chemistry      Component Value Date/Time   NA 136 10/24/2023 0818   NA 138 05/13/2014 0839   K 3.9 10/24/2023 0818   K 4.0 05/13/2014 0839   CL 104 10/24/2023 0818   CO2 24 10/24/2023 0818   CO2 29 05/13/2014 0839   BUN 10 10/24/2023 0818   BUN 10.8 05/13/2014 0839   CREATININE 0.77 10/24/2023 0818   CREATININE 0.8 05/13/2014 0839      Component Value Date/Time   CALCIUM 8.9 10/24/2023 0818   CALCIUM 9.1 05/13/2014 0839   ALKPHOS 162 (H) 10/24/2023 0818   ALKPHOS 142 05/13/2014 0839   AST 55 (H) 10/24/2023 0818   AST 52 (H) 05/13/2014 0839   ALT 41 10/24/2023 0818   ALT 41 05/13/2014 0839   BILITOT 0.9 10/24/2023 0818   BILITOT 0.41 05/13/2014 0839       RADIOGRAPHIC STUDIES: I have personally reviewed the radiological images as listed and agreed with the findings in the report. DG Ribs Unilateral W/Chest Left Result Date: 10/24/2023 CLINICAL DATA:  Chest pain EXAM: LEFT RIBS AND  CHEST - 3+ VIEW COMPARISON:  February 20, 2016 . FINDINGS: fracture of the left seventh, eighth left posterolateral ribs with mild displacement of the left eighth rib fracture. No infiltrates or pneumothorax IMPRESSION: Rib fractures as described Electronically Signed   By: Fredrich Jefferson M.D.   On: 10/24/2023 09:52

## 2023-10-25 ENCOUNTER — Inpatient Hospital Stay: Admitting: Nurse Practitioner

## 2023-10-25 ENCOUNTER — Telehealth: Payer: Self-pay

## 2023-10-25 NOTE — Telephone Encounter (Signed)
 Patient did not arrive for his appointment today with Heather Boscia,NP. Reached out to the patient via telephone call. Patient stated he did not feel well enough to come in today. Patient stated he was seen at the ED yesterday, where they discovered rib fractures.  Transferred patient call to scheduler to get today's appointment rescheduled.

## 2023-10-30 ENCOUNTER — Telehealth: Payer: Self-pay | Admitting: Hematology

## 2023-10-30 ENCOUNTER — Other Ambulatory Visit: Payer: Self-pay

## 2023-11-08 ENCOUNTER — Inpatient Hospital Stay

## 2023-11-08 DIAGNOSIS — K7469 Other cirrhosis of liver: Secondary | ICD-10-CM

## 2023-11-08 LAB — CMP (CANCER CENTER ONLY)
ALT: 45 U/L — ABNORMAL HIGH (ref 0–44)
AST: 73 U/L — ABNORMAL HIGH (ref 15–41)
Albumin: 3.3 g/dL — ABNORMAL LOW (ref 3.5–5.0)
Alkaline Phosphatase: 302 U/L — ABNORMAL HIGH (ref 38–126)
Anion gap: 3 — ABNORMAL LOW (ref 5–15)
BUN: 12 mg/dL (ref 8–23)
CO2: 27 mmol/L (ref 22–32)
Calcium: 8.8 mg/dL — ABNORMAL LOW (ref 8.9–10.3)
Chloride: 103 mmol/L (ref 98–111)
Creatinine: 0.73 mg/dL (ref 0.61–1.24)
GFR, Estimated: >60 mL/min (ref >60)
Glucose, Bld: 246 mg/dL — ABNORMAL HIGH (ref 70–99)
Potassium: 4.2 mmol/L (ref 3.5–5.1)
Sodium: 133 mmol/L — ABNORMAL LOW (ref 135–145)
Total Bilirubin: 0.6 mg/dL (ref 0.0–1.2)
Total Protein: 7.1 g/dL (ref 6.5–8.1)

## 2023-11-08 LAB — CBC WITH DIFFERENTIAL (CANCER CENTER ONLY)
Abs Immature Granulocytes: 0.02 10*3/uL (ref 0.00–0.07)
Basophils Absolute: 0 10*3/uL (ref 0.0–0.1)
Basophils Relative: 0 %
Eosinophils Absolute: 0.6 10*3/uL — ABNORMAL HIGH (ref 0.0–0.5)
Eosinophils Relative: 12 %
HCT: 41 % (ref 39.0–52.0)
Hemoglobin: 14.6 g/dL (ref 13.0–17.0)
Immature Granulocytes: 0 %
Lymphocytes Relative: 19 %
Lymphs Abs: 0.9 10*3/uL (ref 0.7–4.0)
MCH: 32.9 pg (ref 26.0–34.0)
MCHC: 35.6 g/dL (ref 30.0–36.0)
MCV: 92.3 fL (ref 80.0–100.0)
Monocytes Absolute: 0.5 10*3/uL (ref 0.1–1.0)
Monocytes Relative: 10 %
Neutro Abs: 2.7 10*3/uL (ref 1.7–7.7)
Neutrophils Relative %: 59 %
Platelet Count: 133 10*3/uL — ABNORMAL LOW (ref 150–400)
RBC: 4.44 MIL/uL (ref 4.22–5.81)
RDW: 13.8 % (ref 11.5–15.5)
WBC Count: 4.7 10*3/uL (ref 4.0–10.5)
nRBC: 0 % (ref 0.0–0.2)

## 2023-11-08 LAB — IRON AND IRON BINDING CAPACITY (CC-WL,HP ONLY)
Iron: 330 ug/dL — ABNORMAL HIGH (ref 45–182)
Saturation Ratios: 96 % — ABNORMAL HIGH (ref 17.9–39.5)
TIBC: 344 ug/dL (ref 250–450)
UIBC: 14 ug/dL — ABNORMAL LOW (ref 117–376)

## 2023-11-08 LAB — FERRITIN: Ferritin: 128 ng/mL (ref 24–336)

## 2024-01-01 NOTE — Telephone Encounter (Signed)
 Forms completed & faxed to facility, confirmation success.

## 2024-01-08 NOTE — Therapy (Signed)
 OUTPATIENT PHYSICAL THERAPY LOWER EXTREMITY EVALUATION   Patient Name: John [name] MRN: [id number] DOB:Oct 20, 1955, 68 y.o., male Today's Date: 01/09/2024  END OF SESSION:  PT End of Session - 01/09/24 1151     Visit Number 1    Date for PT Re-Evaluation 03/05/24    Authorization Type [employer]-auth requested    Progress Note Due on Visit 10    PT Start Time 1103    PT Stop Time 1147    PT Time Calculation (min) 44 min    Activity Tolerance Patient limited by pain    Behavior During Therapy [facility] for tasks assessed/performed          Past Medical History:  Diagnosis Date   Anxiety    Diabetes (HCC)    Foot pain left   to have MRI tues.    Hepatitis C    High cholesterol    Past Surgical History:  Procedure Laterality Date   None     Patient Active Problem List   Diagnosis Date Noted   Hypovolemia due to dehydration 07/31/2022   Uncontrolled type 1 diabetes mellitus with hyperglycemia (HCC) 03/24/2020   Chronic motor tic 04/28/2018   Vertigo 02/25/2018   Chronic right hip pain 12/11/2016   Liver cirrhosis (HCC) 11/10/2015   Adjustment disorder with mixed anxiety and depressed mood 06/01/2015   Secondary esophageal varices without bleeding (HCC) 12/15/2014   Chronic dental infection 12/11/2014   Thrombocytopenia (HCC) 11/11/2014   Anatomical narrow angle, bilateral 12/20/2013   Tic 09/28/2013   Tremors of nervous system 07/01/2013   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   Diabetes (HCC)    High cholesterol    Anxiety    Hepatitis C    Skin pustule 12/01/2012   Cramps, extremity 10/17/2012   Vitamin D insufficiency 05/14/2012   Hematochezia 03/17/2012   Male hypogonadism 08/20/2011   Diabetic neuropathy (HCC) 08/14/2011   Onychomycosis due to dermatophyte 08/14/2011   Pancytopenia (HCC) 06/06/2011   Depression 03/19/2011    PCP: Lenon Boyer  REFERRING PROVIDER: Donata Clapper, PA  REFERRING DIAG:  Diagnosis  M51.26 (ICD-10-CM) - Other intervertebral  disc displacement, lumbar region    THERAPY DIAG:  Other low back pain - Plan: PT plan of care cert/re-cert  Cramp and spasm - Plan: PT plan of care cert/re-cert  Other abnormalities of gait and mobility - Plan: PT plan of care cert/re-cert  Muscle weakness (generalized) - Plan: PT plan of care cert/re-cert  Rationale for Evaluation and Treatment: Rehabilitation  ONSET DATE: 10/2023- Lt rib fracture   SUBJECTIVE:   SUBJECTIVE STATEMENT: Pt is a 68 y.o. male and present to PT with LBP and Rt LE pain that has worsened since a fall in May.  He fell on his driveway.  He had Lt rib fractures at the time.  Pt bent over recently to pull something toward him and had increased Rt LE pain.  Pt is scheduled with [facility] spine specialist on 01/29/24.  He has been walking with a cane and mobility is significanlty   PERTINENT HISTORY: Diabetes, vertigo, motor tic/tremors, depression, diabetic neuropathy  PAIN: 01/09/24 Are you having pain? Yes: NPRS scale: 0-10/10 Pain location: Rt low back to Rt gluteals  Pain description: it kills me, intense Aggravating factors: moving, bed mobility, standing with Rt=Lt (not able), sitting (not able) Relieving factors: shifting off of Rt LE to the Lt, warm bath   PRECAUTIONS: Fall  RED FLAGS: None   WEIGHT BEARING RESTRICTIONS: No  FALLS:  Has patient fallen in  last 6 months? Yes. Number of falls many times-PT will address balance   LIVING ENVIRONMENT: Lives with: lives with their spouse Lives in: House/apartment Stairs: Yes: External: 3 steps; on right going up Has following equipment at home: Single point cane, Walker - 2 wheeled, Wheelchair (manual), and Ramped entry  OCCUPATION: does not work   PLOF: Independent, Independent with household mobility with device, and Leisure: walk for exercise  PATIENT GOALS: walk without pain, reduce pain, sit longer, go to music festival in September   NEXT MD VISIT: specialist at [facility]  01/29/24  OBJECTIVE:  Note: Objective measures were completed at Evaluation unless otherwise noted.  DIAGNOSTIC FINDINGS:  Lumbar x-ray 01/08/24 FINDINGS: No acute fracture.  No significant listhesis.  Moderate degenerative disc and facet changes at L5-S1, mild at the remaining levels.  Additional findings: Vascular calcifications. Increased stool burden.  Rt hip:  FINDINGS:  No acute fracture dislocation. 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 The adjacent soft tissues are unremarkable.    PATIENT SURVEYS:  01/09/24: Modified Oswestry: 30/50=60% disability    COGNITION: Overall cognitive status: Within functional limits for tasks assessed     SENSATION: WFL   MUSCLE LENGTH: Unable to test due to irritability of symptoms   POSTURE: rounded shoulders, forward head, and weight shift left  PALPATION: Palpable tenderness over Lt lumbar paraspinals and gluteals.   LOWER EXTREMITY ROM:  Unable to test due to irritability of symptoms   LOWER EXTREMITY MMT: Pain with resisted testing in Rt gluteals. At least 4/5 in bil hips and knees    FUNCTIONAL TESTS:  01/09/24 Timed up and go (TUG): 30.72 seconds with cane   GAIT: Distance walked: 25 feet  Assistive device utilized: Single point cane Level of assistance: SBA Comments: instability, asymmetry with reduced time spent on Rt LE, guarded movement                                                                                                                        TREATMENT DATE:  01/09/24 Findings from evaluation discussed, pt educated on plan of care, HEP initiated.    PATIENT EDUCATION:  Education details: [email or web address], Environmental education officer Person educated: Patient Education method: Explanation, Demonstration, and Handouts Education comprehension: verbalized understanding and returned demonstration  HOME EXERCISE PROGRAM: Access Code: [email or web address] URL:  [email or web address] Date: 01/09/2024 Prepared by: Burnard  Exercises - Supine Lower Trunk Rotation  - 3 x daily - 7 x weekly - 1 sets - 3 reps - 20 hold - Seated Hamstring Stretch  - 3 x daily - 7 x weekly - 1 sets - 3 reps - 20 hold - Hooklying Single Knee to Chest Stretch  - 3 x daily - 7 x weekly - 1 sets - 3 reps - 20 hold - Supine Hip Adduction Isometric with Ball  - 3 x daily - 7 x weekly - 1-2 sets - 10 reps - 5 hold  ASSESSMENT:  CLINICAL IMPRESSION: Patient is  a 68 y.o. male who was seen today for physical therapy evaluation and treatment for LBP and Rt LE pain. Pt had a fall in 10/2023 with Lt rib fractures.  Pt reports that pain in the Rt low back and gluteals has been worsening since the time of his fall.  He had an x-ray at [facility] and is going to see a spine specialist next month for further evaluation. Mobility is slow and significantly slow and guarded, Lt weight shift in standing and sitting.  Reduced time on Rt LE with gait and this is improved with use of walker vs cane.  He has order from [facility] for rolling walker and will pursue getting this soon as it helped to improve symmetry with ambulation.   He has had multiple falls in the past 6 months and PT will address this through treatment. Gentle mobility initiated today and advise some aquatics sessions in addition to land.  Patient will benefit from skilled PT to address the below impairments and improve overall function.   OBJECTIVE IMPAIRMENTS: Abnormal gait, decreased activity tolerance, decreased balance, decreased endurance, decreased mobility, difficulty walking, decreased strength, decreased safety awareness, increased muscle spasms, impaired flexibility, improper body mechanics, postural dysfunction, and pain.   ACTIVITY LIMITATIONS: carrying, lifting, bending, sitting, standing, squatting, sleeping, stairs, transfers, bed mobility, bathing, toileting, dressing, hygiene/grooming, and locomotion level  PARTICIPATION  LIMITATIONS: meal prep, cleaning, laundry, shopping, and community activity  PERSONAL FACTORS: 1-2 comorbidities: fall with rib fracture, lumbar DDD are also affecting patient's functional outcome.   REHAB POTENTIAL: Good  CLINICAL DECISION MAKING: Stable/uncomplicated  EVALUATION COMPLEXITY: Low   GOALS: Goals reviewed with patient? Yes  SHORT TERM GOALS: Target date: 02/06/2024   Be independent in initial HEP Baseline: Goal status: INITIAL  2.  Sit with Rt=Lt weight bearing x 5 minutes due to reduced Rt LE pain Baseline:  Goal status: INITIAL  3.  Ambulate with rolling walker x 5-7 minutes with neutral posture/weightbearing for household function  Baseline:  Goal status: INITIAL  4.  Report > or = to 20% reduction in Rt LE pain to improve function with ADLs and self-care Baseline:  Goal status: INITIAL    LONG TERM GOALS: Target date: 03/05/2024    Be independent in advanced HEP Baseline:  Goal status: INITIAL  2.  Improve Modified Oswestry to < or = to 48% disability  Baseline: 60% disability (30/50) Goal status: INITIAL  3.  Improve TUG to < or = to 16 seconds to reduce falls risk  Baseline: 30 seconds  Goal status: INITIAL  4.  Sit with Rt=Lt weight bearing x 20 minutes due to improve riding in the car Baseline:  Goal status: INITIAL  5.  Report > or = to 40% reduction in Rt LE pain with self-care and ADLs  Baseline:  Goal status: INITIAL  6.  Stand with Rt=Lt weightbearing x 10 min for ADLs due to reduced pain Baseline:  Goal status: INITIAL   PLAN:  PT FREQUENCY: 2x/week  PT DURATION: 8 weeks  PLANNED INTERVENTIONS: [id number]-Therapeutic exercises, 97530- Therapeutic activity, [id number]- Neuromuscular re-education, 97535- Self Care, [id number]- Manual therapy, [id number]- Gait training, [phone]- Canalith repositioning, [id number]- Aquatic Therapy, [phone]- Electrical stimulation (unattended), [phone]- Electrical stimulation (manual), [id number]- Vasopneumatic device, [id number]-  Ultrasound, [id number]- Traction (mechanical), [id number] (1-2 muscles), [id number] (3+ muscles)- Dry Needling, Patient/Family education, Balance training, Taping, Joint mobilization, Joint manipulation, Spinal manipulation, Spinal mobilization, Vestibular training, and Cryotherapy  PLAN FOR NEXT SESSION: gait with walker and focus on symmetry, gentle mobility and  advance as able, aquatics and land  [employer], PT 01/09/24 1:00 PM

## 2024-01-09 ENCOUNTER — Other Ambulatory Visit: Payer: Self-pay

## 2024-01-09 ENCOUNTER — Ambulatory Visit: Attending: Physician Assistant

## 2024-01-09 DIAGNOSIS — M6281 Muscle weakness (generalized): Secondary | ICD-10-CM | POA: Insufficient documentation

## 2024-01-09 DIAGNOSIS — M5459 Other low back pain: Secondary | ICD-10-CM | POA: Diagnosis present

## 2024-01-09 DIAGNOSIS — R252 Cramp and spasm: Secondary | ICD-10-CM | POA: Diagnosis present

## 2024-01-09 DIAGNOSIS — R2689 Other abnormalities of gait and mobility: Secondary | ICD-10-CM | POA: Insufficient documentation

## 2024-01-09 NOTE — Patient Instructions (Signed)
 [place] [facility]  Welcome to [facility]! Here you will find all the information you will need regarding your pool therapy. If you have further questions at any time, please call our office at [phone]. After completing your initial evaluation in the [facility], you may be eligible to complete a portion of your therapy in the pool. A typical week of therapy will consist of 1-2 typical physical therapy visits at our [facility] location and an additional session of therapy in the pool located at the [facility] at [facility]. [address], [place] [address]. The phone number at the pool site is [phone]. Please call this number if you are running late or need to cancel your appointment.  Check-in on MyChart then meet your therapist at the pool deck. (If you can't access MyChart, you may check in with the therapist at the pool deck.)   Each session will last approximately 45 minutes. All scheduling and payments for aquatic therapy sessions, including cancelations, will be done through our [facility] location.  To be eligible for aquatic therapy, these criteria must be met: You must be able to independently change in the locker room and get to the pool deck. A caregiver can come with you to help if needed however they do need to be the same sex to enter the locker room. Or you may change in a bathroom privately with opposite sex caregiver if needed. There are benches for a caregiver to sit on next to the pool.  Handicap parking is available in the front and there is a drop off option for even closer accessibility.  Please arrive 15 minutes prior to your appointment to prepare for your pool session. You must sign in at the front desk upon your arrival. Please be sure to attend to any toileting needs prior to entering the pool. Locker rooms for changing are available.  There is direct access to the pool deck from the locker  room. You can lock your belongings in a locker or bring them with you poolside. Your therapist will greet you on the pool deck. There may be other swimmers in the pool at the same time but your session is one-on-one with the therapist.    What to Expect Arrive 15 min early for your appointment and check in with rehab front desk. Please limit use of body lotions and hair products before entering the pool. Locker rooms are available for showering, changing and toileting. Appointments are 45 minutes with your therapist. (This does not include changing times) The pool is approximately 500 feet from the nearest parking lot. There are benches and chairs along the walk. Please bring a support person if you need assistance traveling the distanceto the pool or assistance with changing/toileting. Stairs with handrails as well as a lift chair are available at the pool.  Depth is 3'6"-4'8" and temperature is between 88-90 degrees. The pool deck is tile flooring and gets slippery, water shoes are strongly encouraged but not required. Please wear a bathing suit or athletic shorts and a t-shirt. Recommended to bring your own towel. Severe weather:Thunder or lightning results in closure of the pool deck for 30 minutes and is extended with each incidence. Your appointment may be moved to land or canceled with the option to reschedule. Tell your therapist if you have any of the following: Open wounds Active infection Fear of water Bowel or bladder incontinence  Benefits of Aquatic Therapy:  Reduces Stress on Joints  and Muscles  The buoyancy of water supports body weight, making movement easier and less painful.  Builds Strength and Stability  The viscosity of water provides resistance that allows individuals to strengthen muscles while also providing a safe environment to improve balance and coordination.  Promotes Relaxation  The warm water and the feeling of being supported can help reduce stress and be  beneficial for overall well-being.

## 2024-01-10 ENCOUNTER — Ambulatory Visit: Attending: Physician Assistant | Admitting: Physical Therapy

## 2024-01-10 ENCOUNTER — Encounter: Payer: Self-pay | Admitting: Physical Therapy

## 2024-01-10 DIAGNOSIS — M6281 Muscle weakness (generalized): Secondary | ICD-10-CM | POA: Diagnosis present

## 2024-01-10 DIAGNOSIS — R252 Cramp and spasm: Secondary | ICD-10-CM | POA: Insufficient documentation

## 2024-01-10 DIAGNOSIS — R2689 Other abnormalities of gait and mobility: Secondary | ICD-10-CM | POA: Diagnosis present

## 2024-01-10 DIAGNOSIS — R293 Abnormal posture: Secondary | ICD-10-CM | POA: Insufficient documentation

## 2024-01-10 DIAGNOSIS — R262 Difficulty in walking, not elsewhere classified: Secondary | ICD-10-CM | POA: Diagnosis present

## 2024-01-10 DIAGNOSIS — M5459 Other low back pain: Secondary | ICD-10-CM | POA: Insufficient documentation

## 2024-01-10 NOTE — Therapy (Signed)
 OUTPATIENT PHYSICAL THERAPY LOWER EXTREMITY PROGRESS NOTE   Patient Name: John [name] MRN: [id number] DOB:[birth year], 68 y.o., male Today's Date: 01/10/2024  END OF SESSION:  PT End of Session - 01/10/24 1418     Visit Number 2    Date for PT Re-Evaluation 03/05/24    Authorization Type [employer] requested    Progress Note Due on Visit 10    PT Start Time 1425    PT Stop Time 1445    PT Time Calculation (min) 20 min    Activity Tolerance Patient limited by pain    Behavior During Therapy --           Past Medical History:  Diagnosis Date   Anxiety    Diabetes (HCC)    Foot pain left   to have MRI tues.    Hepatitis C    High cholesterol    Past Surgical History:  Procedure Laterality Date   None     Patient Active Problem List   Diagnosis Date Noted   Hypovolemia due to dehydration 07/31/2022   Uncontrolled type 1 diabetes mellitus with hyperglycemia (HCC) 03/24/2020   Chronic motor tic 04/28/2018   Vertigo 02/25/2018   Chronic right hip pain 12/11/2016   Liver cirrhosis (HCC) 11/10/2015   Adjustment disorder with mixed anxiety and depressed mood 06/01/2015   Secondary esophageal varices without bleeding (HCC) 12/15/2014   Chronic dental infection 12/11/2014   Thrombocytopenia (HCC) 11/11/2014   Anatomical narrow angle, bilateral 12/20/2013   Tic 09/28/2013   Tremors of nervous system 07/01/2013   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   Diabetes (HCC)    High cholesterol    Anxiety    Hepatitis C    Skin pustule 12/01/2012   Cramps, extremity 10/17/2012   Vitamin D insufficiency 05/14/2012   Hematochezia 03/17/2012   Male hypogonadism 08/20/2011   Diabetic neuropathy (HCC) 08/14/2011   Onychomycosis due to dermatophyte 08/14/2011   Pancytopenia (HCC) 06/06/2011   Depression 03/19/2011    PCP: Lenon Boyer  REFERRING PROVIDER: Donata Clapper, PA  REFERRING DIAG:  Diagnosis  M51.26 (ICD-10-CM) - Other intervertebral disc displacement, lumbar  region    THERAPY DIAG:  Other low back pain  Cramp and spasm  Other abnormalities of gait and mobility  Muscle weakness (generalized)  Rationale for Evaluation and Treatment: Rehabilitation  ONSET DATE: 10/2023- Lt rib fracture   SUBJECTIVE:   SUBJECTIVE STATEMENT:I am hurting pretty bad today..Pt ambulated all the way to the pool using his cane. This appeared to increase his pain prior to his visit today.  PERTINENT HISTORY: Diabetes, vertigo, motor tic/tremors, depression, diabetic neuropathy  PAIN: 01/09/24 Are you having pain? Yes: NPRS scale: 10/10 Pain location: Rt low back to Rt gluteals  Pain description: it kills me, intense Aggravating factors: moving, bed mobility, standing with Rt=Lt (not able), sitting (not able) Relieving factors: shifting off of Rt LE to the Lt, warm bath   PRECAUTIONS: Fall  RED FLAGS: None   WEIGHT BEARING RESTRICTIONS: No  FALLS:  Has patient fallen in last 6 months? Yes. Number of falls many times-PT will address balance   LIVING ENVIRONMENT: Lives with: lives with their spouse Lives in: House/apartment Stairs: Yes: External: 3 steps; on right going up Has following equipment at home: Single point cane, Walker - 2 wheeled, Wheelchair (manual), and Ramped entry  OCCUPATION: does not work   PLOF: Independent, Independent with household mobility with device, and Leisure: walk for exercise  PATIENT GOALS: walk without pain, reduce pain, sit longer,  go to music festival in September   NEXT MD VISIT: specialist at [facility] 01/29/24  OBJECTIVE:  Note: Objective measures were completed at Evaluation unless otherwise noted.  DIAGNOSTIC FINDINGS:  Lumbar x-ray 01/08/24 FINDINGS: No acute fracture.  No significant listhesis.  Moderate degenerative disc and facet changes at L5-S1, mild at the remaining levels.  Additional findings: Vascular calcifications. Increased stool burden.  Rt hip:  FINDINGS:  No acute fracture  dislocation. 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 The adjacent soft tissues are unremarkable.    PATIENT SURVEYS:  01/09/24: Modified Oswestry: 30/50=60% disability    COGNITION: Overall cognitive status: Within functional limits for tasks assessed     SENSATION: WFL   MUSCLE LENGTH: Unable to test due to irritability of symptoms   POSTURE: rounded shoulders, forward head, and weight shift left  PALPATION: Palpable tenderness over Lt lumbar paraspinals and gluteals.   LOWER EXTREMITY ROM:  Unable to test due to irritability of symptoms   LOWER EXTREMITY MMT: Pain with resisted testing in Rt gluteals. At least 4/5 in bil hips and knees    FUNCTIONAL TESTS:  01/09/24 Timed up and go (TUG): 30.72 seconds with cane   GAIT: Distance walked: 25 feet  Assistive device utilized: Single point cane Level of assistance: SBA Comments: instability, asymmetry with reduced time spent on Rt LE, guarded movement                                                                                                                        TREATMENT DATE:   01/10/24: Pt arrives for aquatic physical therapy. Treatment took place in 3.5-5.5 feet of water. Water temperature was 91 degrees F. Pt entered the pool via. Seated water bench with 75% submersion Pt performed seated LE AROM exercises 10x in all planes, concurrent discussion of water principles and how we will use them with exercise.Pt verbally understood. Standing heel raises bil 6x. Marching 6x Bil: holding on wall heavily. Pt's pain was increasing,he was getting agitated so we ended session early.     01/09/24 Findings from evaluation discussed, pt educated on plan of care, HEP initiated.    PATIENT EDUCATION:  Education details: [email or web address], Environmental education officer Person educated: Patient Education method: Explanation, Demonstration, and Handouts Education comprehension: verbalized understanding  and returned demonstration  HOME EXERCISE PROGRAM: Access Code: [email or web address] URL: [email or web address] Date: 01/09/2024 Prepared by: Burnard  Exercises - Supine Lower Trunk Rotation  - 3 x daily - 7 x weekly - 1 sets - 3 reps - 20 hold - Seated Hamstring Stretch  - 3 x daily - 7 x weekly - 1 sets - 3 reps - 20 hold - Hooklying Single Knee to Chest Stretch  - 3 x daily - 7 x weekly - 1 sets - 3 reps - 20 hold - Supine Hip Adduction Isometric with Ball  - 3 x daily - 7 x weekly - 1-2 sets - 10 reps - 5 hold  ASSESSMENT:  CLINICAL IMPRESSION:Pt did not have a very good first visit at the pool. He walked all the way to the pool deck using only is cane which pretty much exhausted him and increased his right buttocks pain pretty severely. He and his wife were educated in how to get a WC on his next visit. Pt had a very difficult time just managing sitting in the water and holding his balance. The gentle current was a huge force for him to get used to. Feeling this was very startling to him and bothersome. He did not tolerate much than seated exercises which he didn't tolerate great. Treatment was ended early due to his pian levels. He did receive his walker this Am and will bring to his next land appt.   OBJECTIVE IMPAIRMENTS: Abnormal gait, decreased activity tolerance, decreased balance, decreased endurance, decreased mobility, difficulty walking, decreased strength, decreased safety awareness, increased muscle spasms, impaired flexibility, improper body mechanics, postural dysfunction, and pain.   ACTIVITY LIMITATIONS: carrying, lifting, bending, sitting, standing, squatting, sleeping, stairs, transfers, bed mobility, bathing, toileting, dressing, hygiene/grooming, and locomotion level  PARTICIPATION LIMITATIONS: meal prep, cleaning, laundry, shopping, and community activity  PERSONAL FACTORS: 1-2 comorbidities: fall with rib fracture, lumbar DDD are also affecting patient's functional  outcome.   REHAB POTENTIAL: Good  CLINICAL DECISION MAKING: Stable/uncomplicated  EVALUATION COMPLEXITY: Low   GOALS: Goals reviewed with patient? Yes  SHORT TERM GOALS: Target date: 02/06/2024   Be independent in initial HEP Baseline: Goal status: INITIAL  2.  Sit with Rt=Lt weight bearing x 5 minutes due to reduced Rt LE pain Baseline:  Goal status: INITIAL  3.  Ambulate with rolling walker x 5-7 minutes with neutral posture/weightbearing for household function  Baseline:  Goal status: INITIAL  4.  Report > or = to 20% reduction in Rt LE pain to improve function with ADLs and self-care Baseline:  Goal status: INITIAL    LONG TERM GOALS: Target date: 03/05/2024    Be independent in advanced HEP Baseline:  Goal status: INITIAL  2.  Improve Modified Oswestry to < or = to 48% disability  Baseline: 60% disability (30/50) Goal status: INITIAL  3.  Improve TUG to < or = to 16 seconds to reduce falls risk  Baseline: 30 seconds  Goal status: INITIAL  4.  Sit with Rt=Lt weight bearing x 20 minutes due to improve riding in the car Baseline:  Goal status: INITIAL  5.  Report > or = to 40% reduction in Rt LE pain with self-care and ADLs  Baseline:  Goal status: INITIAL  6.  Stand with Rt=Lt weightbearing x 10 min for ADLs due to reduced pain Baseline:  Goal status: INITIAL   PLAN:  PT FREQUENCY: 2x/week  PT DURATION: 8 weeks  PLANNED INTERVENTIONS: 97110-Therapeutic exercises, 97530- Therapeutic activity, [id number]- Neuromuscular re-education, 97535- Self Care, [id number]- Manual therapy, [phone]- Gait training, [phone]- Canalith repositioning, [id number]- Aquatic Therapy, [phone]- Electrical stimulation (unattended), [phone]- Electrical stimulation (manual), [id number]- Vasopneumatic device, [id number]- Ultrasound, [id number]- Traction (mechanical), [id number] (1-2 muscles), [id number] (3+ muscles)- Dry Needling, Patient/Family education, Balance training, Taping, Joint mobilization, Joint manipulation,  Spinal manipulation, Spinal mobilization, Vestibular training, and Cryotherapy  PLAN FOR NEXT SESSION: gait with walker and focus on symmetry, gentle mobility and advance as able, aquatics and land  Delon Darner, PTA 01/10/24 3:09 PM

## 2024-01-10 NOTE — Progress Notes (Signed)
 Patient was consented for utilization of ambient recorder DAX Copilot tool for preparation of note for today's encounter. Note reviewed and edited by provider before finalizing.    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  In May 2025, he broke ribs 7 and 8 and was getting better but is still not healed. He did not understand why his right side hurt so bad when it was the left side that had the problem. He kept working with it and moved something recently, which exacerbated the pain and resulted in a herniated disc in his back.  He is in considerable pain at this time. He is scheduled for an MRI and stared therapy on Monday. He also notes that he received a small dose of steroids injected into his back.   He is currently on a regimen of 35 units of Lantus in the morning and Novolog  8 units with meals. Blood sugar levels have been high, even when no food is consumed. He expresses concern about the potential need for additional  steroid injections, as he believes they may cause a significant increase in blood sugar levels. He recalls an instance where he had to administer over 25 units of  corrective insulin  without having eaten anything to get better glucose control. He has already received a low dose of steroids from another physician. He is scheduled to see a spine specialist on 01/29/2024.  He and his family are slated to take a trip put of town in the Sept so he is anxious to get his pain under control.   Review of Systems:  Complete ROS obtained and pertinent positives noted in the HPI   Medications Current Rx[1]     Objective Data  Vital Signs: Vitals:   01/10/24 1120  BP: 135/70  Pulse: 88  Temp: 97.3 F (36.3 C)     Physical Exam: GENERAL:  alert, well-hydrated, well-nourished, and in mild discomfort EYES: pupils equally  round; no proptosis, lid lag, or stare  THYROID: no visible goiter;  CARDIOVASCULAR:  RRR, normal S1, S2 RESPIRATORY:  Normal respiratory effort EXTREMITIES:  Lower extremities warm, well perfused, no edema noted MSK: walking slowly. Ambulating with cane SKIN: no rashes, wounds, nail or skin changes NEURO:  alert, aware, answers questions appropriately; no tremor with outstretched hands PSYCH: pleasant mood  Assessment/Plan  John [name] is a 68 y.o.  male with a past medical history of above who presents for a follow-up visit.  Assessment & Plan 1. Type 1 Diabetes Mellitus, secondary to hematochromatosis - A1c level is currently at 7.5. - Discussed the need to increase Lantus dosage by 20% on the day of steroid injection, resulting in a total of 50 units daily.  - Advised to increase Humalog base to 12 units to manage potential blood sugar spikes due to the steroid injection. Emphasized the importance of maintaining consistent insulin  intake and the impact of pain and discomfort on blood sugar levels.  - Discussed potential adjustments if surgery is recommended, including reducing Lantus dosage to 40 units if surgery is required.    Return in about 4 months (around 05/11/2024) for T1dm.  Electronically signed by: Josette Marvis Gearing, MD 01/10/2024 9:41 AM         [1] Current Outpatient Medications  Medication Sig Dispense Refill  . albuterol HFA (PROVENTIL HFA;VENTOLIN HFA;PROAIR HFA) 90 mcg/actuation inhaler Inhale 2 puffs every  4 (four) hours as needed for wheezing.    . BD Ultra-Fine Short Pen Needle 31 gauge x 5/16 ndle USE AS DIRECTED FOR INJECTIONS 4 TO 5 TIMES DAILY. Make an appt with provider for continued refills. 500 each 0  . carvediloL (COREG) 6.25 mg tablet Take 6.25 mg by mouth Once Daily. 30 tablet 11  . flash glucose scanning reader (FreeStyle Libre 2 Reader) misc Scan as needed for continuous glucose monitoring. 1 each 0  . flash glucose sensor (FreeStyle  Libre 2 Sensor) kit INJECT 1 SENSOR TO THE SKIN EVERY 14 DAYS FOR  CONTINUOUS  GLUCOSE  MONITORING. 2 each 3  . gabapentin  (NEURONTIN ) 100 mg capsule Take 100 mg by mouth every morning.    . gabapentin  (NEURONTIN ) 300 mg capsule Take 300 mg by mouth nightly.    SABRA glucagon (Baqsimi) 3 mg/actuation spry Administer 1 spray into affected nostril(s) as needed. 2 each 0  . glucose blood (Accu-Chek Aviva Plus test strp) test strip USE 1 STRIP TO CHECK GLUCOSE FIVE TIMES DAILY 300 each 5  . Lantus Solostar U-100 Insulin  100 unit/mL (3 mL) pen INJECT 40 TO 45 UNITS SUBCUTANEOUSLY ONCE DAILY 30 mL 5  . NovoLOG  Flexpen U-100 Insulin  100 unit/mL (3 mL) pen Inject 4 units three times daily plus sliding scale before meals  TDD 50 units 45 mL 7   No current facility-administered medications for this visit.

## 2024-01-11 ENCOUNTER — Emergency Department (HOSPITAL_COMMUNITY)

## 2024-01-11 ENCOUNTER — Emergency Department (HOSPITAL_COMMUNITY)
Admission: EM | Admit: 2024-01-11 | Discharge: 2024-01-11 | Disposition: A | Discharge status: Home / Self Care | Attending: Emergency Medicine | Admitting: Emergency Medicine

## 2024-01-11 ENCOUNTER — Other Ambulatory Visit: Payer: Self-pay

## 2024-01-11 ENCOUNTER — Encounter (HOSPITAL_COMMUNITY): Payer: Self-pay

## 2024-01-11 DIAGNOSIS — Z794 Long term (current) use of insulin: Secondary | ICD-10-CM | POA: Insufficient documentation

## 2024-01-11 DIAGNOSIS — M5431 Sciatica, right side: Secondary | ICD-10-CM | POA: Diagnosis not present

## 2024-01-11 DIAGNOSIS — E1165 Type 2 diabetes mellitus with hyperglycemia: Secondary | ICD-10-CM | POA: Diagnosis not present

## 2024-01-11 DIAGNOSIS — M25551 Pain in right hip: Secondary | ICD-10-CM | POA: Diagnosis present

## 2024-01-11 LAB — CBC
HCT: 43.6 % (ref 39.0–52.0)
Hemoglobin: 14.6 g/dL (ref 13.0–17.0)
MCH: 32.4 pg (ref 26.0–34.0)
MCHC: 33.5 g/dL (ref 30.0–36.0)
MCV: 96.9 fL (ref 80.0–100.0)
Platelets: 145 K/uL — ABNORMAL LOW (ref 150–400)
RBC: 4.5 MIL/uL (ref 4.22–5.81)
RDW: 13 % (ref 11.5–15.5)
WBC: 5.1 K/uL (ref 4.0–10.5)
nRBC: 0 % (ref 0.0–0.2)

## 2024-01-11 LAB — COMPREHENSIVE METABOLIC PANEL WITH GFR
ALT: 54 U/L — ABNORMAL HIGH (ref 0–44)
AST: 94 U/L — ABNORMAL HIGH (ref 15–41)
Albumin: 2.7 g/dL — ABNORMAL LOW (ref 3.5–5.0)
Alkaline Phosphatase: 150 U/L — ABNORMAL HIGH (ref 38–126)
Anion gap: 9 (ref 5–15)
BUN: 13 mg/dL (ref 8–23)
CO2: 20 mmol/L — ABNORMAL LOW (ref 22–32)
Calcium: 8.8 mg/dL — ABNORMAL LOW (ref 8.9–10.3)
Chloride: 102 mmol/L (ref 98–111)
Creatinine, Ser: 0.9 mg/dL (ref 0.61–1.24)
GFR, Estimated: >60 mL/min (ref >60)
Glucose, Bld: 212 mg/dL — ABNORMAL HIGH (ref 70–99)
Potassium: 4 mmol/L (ref 3.5–5.1)
Sodium: 131 mmol/L — ABNORMAL LOW (ref 135–145)
Total Bilirubin: 0.9 mg/dL (ref 0.0–1.2)
Total Protein: 7.1 g/dL (ref 6.5–8.1)

## 2024-01-11 LAB — CBG MONITORING, ED
Glucose-Capillary: 105 mg/dL — ABNORMAL HIGH (ref 70–99)
Glucose-Capillary: 56 mg/dL — ABNORMAL LOW (ref 70–99)

## 2024-01-11 LAB — LIPASE, BLOOD: Lipase: 27 U/L (ref 11–51)

## 2024-01-11 MED ORDER — IOHEXOL 350 MG/ML SOLN
75.0000 mL | Freq: Once | INTRAVENOUS | Status: AC | PRN
  Administered 2024-01-11: 75 mL via INTRAVENOUS

## 2024-01-11 MED ORDER — GADOBUTROL 1 MMOL/ML IV SOLN
7.0000 mL | Freq: Once | INTRAVENOUS | Status: AC | PRN
  Administered 2024-01-11: 7 mL via INTRAVENOUS

## 2024-01-11 MED ORDER — LORAZEPAM 2 MG/ML IJ SOLN
0.5000 mg | Freq: Once | INTRAMUSCULAR | Status: AC
  Administered 2024-01-11: 0.5 mg via INTRAVENOUS
  Filled 2024-01-11: qty 1

## 2024-01-11 NOTE — ED Provider Notes (Signed)
 [place] EMERGENCY DEPARTMENT AT [facility] Provider Note   CSN: [id number] Arrival date & time: 01/11/24  9049     Patient presents with: Hip Pain   John [name] is a 68 y.o. male.    Hip Pain  Patient presents for right hip pain.  Medical history includes DM, neuropathy, cirrhosis, HLD, anxiety, chronic right hip pain, depression.  He had a fall 3 months ago and sustained 2 broken ribs on the left side.  After the fall, he developed pain in his right upper gluteal area.  He had worsening of this pain after some recent heavy lifting.  He has been walking with a cane since that time.  He had an x-ray imaging of his right hip and lumbar spine last week.  X-ray imaging showed large stool burden.  He took 2 Dulcolax yesterday and has had several bowel movements today.  His PCP was concerned of possible appendicitis or diverticulitis and sent him to the ED.  He underwent recent steroid injection in his back 2 days ago.  He is scheduled to see a spine specialist on the 20th.      Prior to Admission medications   Medication Sig Start Date End Date Taking? Authorizing Provider  ACCU-CHEK AVIVA PLUS test strip SMARTSIG:1 Strip(s) Via Meter 5 Times Daily 06/09/20   [provider]  albuterol (VENTOLIN HFA) 108 (90 Base) MCG/ACT inhaler albuterol sulfate HFA 90 mcg/actuation aerosol inhaler  INHALE 2 PUFFS BY MOUTH EVERY 4 HOURS AS NEEDED FOR WHEEZING 07/01/19   [provider]  azelastine (ASTELIN) 0.1 % nasal spray azelastine 137 mcg (0.1 %) nasal spray aerosol  USE 2 SPRAY(S) IN EACH NOSTRIL 4 TIMES DAILY FOR 3 5 DAYS . THEN USE TWICE DAILY    [provider]  Continuous Blood Gluc Receiver (FREESTYLE LIBRE 2 READER) DEVI Scan as needed for continuous glucose monitoring. 06/15/20   [provider]  Continuous Blood Gluc Sensor (FREESTYLE LIBRE 2 SENSOR) MISC Apply topically. 06/15/20   [provider]  diclofenac  (FLECTOR ) 1.3 % PTCH Place 1  patch onto the skin 2 (two) times daily. 10/24/23   [name], MD  gabapentin  (NEURONTIN ) 100 MG capsule Take 1 capsule by mouth 3 (three) times daily. 03/30/14   [provider]  gabapentin  (NEURONTIN ) 300 MG capsule Take 1 capsule by mouth at bedtime. 08/09/17   [provider]  glucagon 1 MG injection Inject 1 mg into the vein once as needed.    [provider]  Glucagon 3 MG/DOSE POWD Place into the nose. 01/11/21   [provider]  HYDROcodone -acetaminophen  (NORCO/VICODIN) 5-325 MG tablet Take 1 tablet by mouth every 6 (six) hours as needed. 10/24/23   [name], MD  insulin  aspart (NOVOLOG ) 100 unit/mL injection Inject into the skin.    [provider]  insulin  glargine (LANTUS) 100 UNIT/ML injection Inject 30 Units into the skin daily.    [provider]  Insulin  Pen Needle 31G X 8 MM MISC Injects 4 times daily 02/17/14   [provider]  ketoconazole  (NIZORAL ) 2 % cream Apply 1 application topically daily. 07/05/18   [name], [name]  lactulose (CHRONULAC) 10 GM/15ML solution as needed. 08/10/19   [provider]  NOVOLOG  FLEXPEN 100 UNIT/ML FlexPen See admin instructions.  INJECT 4 UNITS SUBCUTANEOUSLY THREE TIMES DAILY PLUS SLIDING SCALE BEFORE MEALS. TDD = 30 UNITS 04/23/22   [provider]  sildenafil (REVATIO) 20 MG tablet See admin instructions. Take  1-5 tabs at least 45 minutes before activity PRN 03/21/23   [provider]    Allergies: Sulfa antibiotics, Sulfamethoxazole, and Testosterone    Review of Systems  Musculoskeletal:  Positive for arthralgias and back pain.  All other systems reviewed and are negative.   Updated Vital Signs BP 127/80   Pulse 78   Temp 98.1 F (36.7 C) (Oral)   Resp 16   Ht 6' (1.829 m)   Wt 78 kg   SpO2 99%   BMI 23.32 kg/m   Physical Exam Vitals and nursing note reviewed.  Constitutional:      General: He is not in acute distress.     Appearance: Normal appearance. He is well-developed. He is not ill-appearing, toxic-appearing or diaphoretic.  HENT:     Head: Normocephalic and atraumatic.     Right Ear: External ear normal.     Left Ear: External ear normal.     Nose: Nose normal.     Mouth/Throat:     Mouth: Mucous membranes are moist.  Eyes:     Extraocular Movements: Extraocular movements intact.     Conjunctiva/sclera: Conjunctivae normal.  Cardiovascular:     Rate and Rhythm: Normal rate and regular rhythm.  Pulmonary:     Effort: Pulmonary effort is normal. No respiratory distress.  Abdominal:     General: There is no distension.     Palpations: Abdomen is soft.  Musculoskeletal:        General: No swelling or deformity.     Cervical back: Normal range of motion and neck supple.     Comments: Positive ipsilateral straight leg raise test on right side.  Skin:    General: Skin is warm and dry.     Coloration: Skin is not jaundiced or pale.  Neurological:     General: No focal deficit present.     Mental Status: He is alert and oriented to person, place, and time.  Psychiatric:        Mood and Affect: Mood normal.        Behavior: Behavior normal.     (all labs ordered are listed, but only abnormal results are displayed) Labs Reviewed  COMPREHENSIVE METABOLIC PANEL WITH GFR - Abnormal; Notable for the following components:      Result Value   Sodium 131 (*)    CO2 20 (*)    Glucose, Bld 212 (*)    Calcium 8.8 (*)    Albumin 2.7 (*)    AST 94 (*)    ALT 54 (*)    Alkaline Phosphatase 150 (*)    All other components within normal limits  CBC - Abnormal; Notable for the following components:   Platelets 145 (*)    All other components within normal limits  CBG MONITORING, ED - Abnormal; Notable for the following components:   Glucose-Capillary 56 (*)    All other components within normal limits  CBG MONITORING, ED - Abnormal; Notable for the following components:   Glucose-Capillary 105 (*)     All other components within normal limits  LIPASE, BLOOD  URINALYSIS, ROUTINE W REFLEX MICROSCOPIC    EKG: None  Radiology: CT L-SPINE NO CHARGE Result Date: 01/11/2024 CLINICAL DATA:  Low back pain, symptoms persist with greater than 6 weeks treatment. Fall in May. Right-sided pain. EXAM: CT LUMBAR SPINE WITHOUT CONTRAST TECHNIQUE: Multidetector CT imaging of the lumbar spine was performed without intravenous contrast administration. Multiplanar CT image reconstructions were also generated. RADIATION DOSE REDUCTION: This  exam was performed according to the departmental dose-optimization program which includes automated exposure control, adjustment of the mA and/or kV according to patient size and/or use of iterative reconstruction technique. COMPARISON:  Same day lumbar spine MRI 01/11/2024. FINDINGS: Segmentation: 5 lumbar vertebrae. The caudal most well-formed intervertebral disc space is designated L5-S1. Alignment: Levocurvature of the lumbar spine. No significant spondylolisthesis. Vertebrae: Lumbar vertebral body height is maintained. No fracture is identified. Paraspinal and other soft tissues: No paraspinal mass or collection. Abdominopelvic soft tissue findings separately reported on same day CT abdomen/pelvis. Disc levels: Please see same day lumbar spine MRI for a description of degenerative findings. IMPRESSION: 1. No evidence of a lumbar spine fracture. 2. Levocurvature of the lumbar spine. 3. Please see same day lumbar spine MRI for a description of degenerative findings. Electronically Signed   By: Rockey Childs D.O.   On: 01/11/2024 13:34   CT ABDOMEN PELVIS W CONTRAST Result Date: 01/11/2024 EXAM: CT ABDOMEN AND PELVIS WITH CONTRAST 01/11/2024 01:04:43 PM TECHNIQUE: CT of the abdomen and pelvis was performed with the administration of intravenous contrast. Multiplanar reformatted images are provided for review. Automated exposure control, iterative reconstruction, and/or weight based  adjustment of the mA/kV was utilized to reduce the radiation dose to as low as reasonably achievable. COMPARISON: US  abdomen 05/28/2023 CLINICAL HISTORY: Abdominal pain, acute, nonlocalized. FINDINGS: LOWER CHEST: Remote left eighth, ninth, and 10th rib fractures. LIVER: Cirrhotic morphology of the liver. No focal liver lesion within the lateral segment of left lobe, there is an indeterminate hypodense lesion not described on prior imaging measuring 3.0 x 2.7 cm, axial image 18/3. GALLBLADDER AND BILE DUCTS: Gallstones. These measure up to 6 mm. No gallbladder wall thickening or pericholecystic inflammation. No signs of choledocholithiasis. The common bile duct measures up to 0.9 cm. No intrahepatic bile duct dilatation. SPLEEN: The spleen measures 13.3 cm in craniocaudal dimension. No focal splenic lesion. There is a small soft tissue nodule between the lateral aspect of the spleen and left lateral abdominal wall. Etiology is indeterminate. PANCREAS: Diffuse pancreatic atrophy. No main duct dilatation or focal mass lesion identified. ADRENAL GLANDS: No acute abnormality. KIDNEYS, URETERS AND BLADDER: No stones in the kidneys or ureters. No hydronephrosis. No perinephric or periureteral stranding. Urinary bladder is unremarkable. GI AND BOWEL: The appendix is visualized and appears normal. No pathologic dilatation the larger or small bowel loops. Focal area of soft tissue stranding within the left hemiabdomen lateral to the proximal descending colon with questionable findings soft tissue nodules measuring up to 5 mm, image 36/3. PERITONEUM AND RETROPERITONEUM: No ascites. No free air. VASCULATURE: Aortic atherosclerosis. Multiple esophageal, gastric, and perisplenic varices identified. LYMPH NODES: No signs of abdominal or pelvic adenopathy. REPRODUCTIVE ORGANS: Mild prostate gland enlargement. BONES AND SOFT TISSUES: No acute osseous abnormality. No focal soft tissue abnormality. IMPRESSION: 1. Cirrhotic morphology  of the liver stigmata of portal venous hypertension including splenomegaly and esophageal and upper abdominal varicosities. 2. Ideterminate hypodense lesion in the lateral segment of the left lobe measuring 3.0 x 2.7 cm, not described on prior imaging. Recommend further evaluation with non-emergent liver protocol MRI 3. Gallstones measuring up to 6 mm without gallbladder wall thickening, pericholecystic inflammation, or choledocholithiasis. 4. Focal soft tissue stranding in the left hemiabdomen lateral to the proximal descending colon with questionable soft tissue nodules measuring up to 5 mm. Small soft tissue nodule between the lateral aspect of the spleen and left lateral abdominal wall. Etiology is indeterminate. Possibly post-inflammatory or infectious. In the absence of  a known malignancy, peritoneal carcinomatosis is considered less favored. Attention to this area on follow-up MRI is advised. Electronically signed by: Waddell Calk MD 01/11/2024 01:30 PM EDT RP Workstation: [id number]   MR Lumbar Spine W Wo Contrast Result Date: 01/11/2024 CLINICAL DATA:  Provided history: Low back pain, symptoms persist with greater than 6 weeks treatment. Additional history provided: The patient reports suffering a fall in May, pain on right side. EXAM: MRI LUMBAR SPINE WITHOUT AND WITH CONTRAST TECHNIQUE: Multiplanar and multiecho pulse sequences of the lumbar spine were obtained without and with intravenous contrast. CONTRAST:  7mL GADAVIST  GADOBUTROL  1 MMOL/ML IV SOLN COMPARISON:  Same day lumbar spine CT 01/11/2024. Report from lumbar spine MRI 09/01/2015 (images currently unavailable). FINDINGS: Segmentation: Five lumbar vertebrae. The caudal-most well-formed intervertebral disc space is designated L5-S1. Alignment: Levocurvature of the lumbar spine. No significant spondylolisthesis. Vertebrae: No lumbar vertebral compression fracture. Mild degenerative endplate edema and enhancement on the right at L3-L4. Conus  medullaris and cauda equina: Conus extends to the L1-L2. Level. No signal abnormality identified within the visualized distal spinal cord. No pathologic enhancement of the visualized distal spinal cord or cauda equina nerve roots. Paraspinal and other soft tissues: No paraspinal mass or collection. Abdominopelvic soft tissue findings separately reported on same day CT abdomen/pelvis. Disc levels: Mild-to-moderate disc degeneration at L5-S1. Disc desiccation without significant disc height loss at the remaining lumbar levels. T12-L1: No significant disc herniation or stenosis. L1-L2: Slight disc bulge. No significant spinal canal or foraminal stenosis. L2-L3: No significant disc herniation or stenosis. L3-L4: Disc bulge. Superimposed broad-based right foraminal/extraforaminal disc protrusion (at site of posterior annular fissure) (for instance as seen on series 6, image 33). No significant spinal canal stenosis. The disc protrusion results in mild right inferior neural foraminal narrowing and could contact the exiting right L3 nerve root beyond the neural foramen. L4-L5: Central posterior annular fissure. Disc bulge. Facet hypertrophy. No significant spinal canal or foraminal stenosis. L5-S1: Mild endplate spurring. Mild facet arthropathy on the left. No significant spinal canal or foraminal stenosis. IMPRESSION: 1. Lumbar spondylosis as outlined within the body of the report. 2. At L3-L4, there is a disc bulge. Superimposed broad-based right foraminal/extraforaminal disc protrusion (at site of posterior annular fissure). The disc protrusion results in mild right inferior neural foraminal narrowing and may contact the exiting right L3 nerve root beyond the neural foramen. Correlate for right L3 radiculopathy. Mild degenerative endplate edema and enhancement on the right. 3. No significant spinal canal or foraminal stenosis at the remaining levels. 4. Levocurvature of the lumbar spine. Electronically Signed   By: Rockey Childs D.O.   On: 01/11/2024 13:22     Procedures   Medications Ordered in the ED  LORazepam  (ATIVAN ) injection 0.5 mg (0.5 mg Intravenous Given 01/11/24 1159)  gadobutrol  (GADAVIST ) 1 MMOL/ML injection 7 mL (7 mLs Intravenous Contrast Given 01/11/24 1301)  iohexol  (OMNIPAQUE ) 350 MG/ML injection 75 mL (75 mLs Intravenous Contrast Given 01/11/24 1305)                                    Medical Decision Making Amount and/or Complexity of Data Reviewed Labs: ordered. Radiology: ordered.  Risk Prescription drug management.   This patient presents to the ED for concern of right hip pain, this involves an extensive number of treatment options, and is a complaint that carries with it a high risk of complications and morbidity.  The  differential diagnosis includes arthritis, neuropathy, radiculopathy, appendicitis   Co morbidities / Chronic conditions that complicate the patient evaluation  DM, neuropathy, cirrhosis, HLD, anxiety, chronic right hip pain, depression   Additional history obtained:  Additional history obtained from EMR External records from outside source obtained and reviewed including N/A   Lab Tests:  I Ordered, and personally interpreted labs.  The pertinent results include: Baseline mild elevations in transaminases, normal kidney function, normal electrolytes, mild hyperglycemia, normal hemoglobin, no leukocytosis   Imaging Studies ordered:  I ordered imaging studies including CT of abdomen, pelvis, L-spine; MRI of lumbar spine I independently visualized and interpreted imaging which showed no acute findings.  Indeterminate left liver lesion and left abdominal nodule seen on CT; MRI shows L3-4 disc bulge with protrusion causing foraminal narrowing at right L3 nerve root. I agree with the radiologist interpretation   Cardiac Monitoring: / EKG:  The patient was maintained on a cardiac monitor.  I personally viewed and interpreted the cardiac monitored which  showed an underlying rhythm of: Sinus rhythm   Problem List / ED Course / Critical interventions / Medication management  Patient presenting for ongoing right hip pain.  Seen at PCPs office recently and told to come to the ED for evaluation due to concerns of possible appendicitis or diverticulitis.  Despite these concerns, patient states that his pain is in his hip and buttocks and not in his abdomen.  Abdomen is soft and nontender.  He does have evidence of right-sided sciatica.  This has caused him some recent weakness and he has been walking with a cane.  Patient declines any pain medication at this time.  Workup was initiated.  Patient underwent CT scan of abdomen and pelvis which did not show any acute intra-abdominal findings.  He does have age-indeterminate left liver lesion and left abdominal nodule.  He was informed of these and advised to follow-up with PCP for outpatient MRI.  On MRI of lumbar spine, he does have L3-4 disc protrusion causing neuroforaminal narrowing at right L3 nerve root.  This is likely the cause of his recent sciatic pain.  Patient has outpatient follow-up with spine doctor in 2-1/2 weeks.  He is stable for discharge at this time. I ordered medication including Ativan  for anxiolysis Reevaluation of the patient after these medicines showed that the patient improved I have reviewed the patients home medicines and have made adjustments as needed  Social Determinants of Health:  Lives with family, has PCP     Final diagnoses:  Sciatica of right side    ED Discharge Orders     None          Melvenia Motto, MD 01/11/24 1448

## 2024-01-11 NOTE — Discharge Instructions (Addendum)
 Your CT scan showed the following: -Indeterminate hypodense lesion in the lateral segment of the left lobe measuring 3.0 x 2.7 cm, not described on prior imaging. Recommend further evaluation with non-emergent liver protocol MRI -Focal soft tissue stranding in the left hemiabdomen lateral to the proximal descending colon with questionable soft tissue nodules measuring up to 5 mm. Small soft tissue nodule between the lateral aspect of the spleen and left lateral abdominal wall. Etiology is indeterminate. Possibly post-inflammatory or infectious. In the absence of a known malignancy, peritoneal carcinomatosis is considered less favored. Attention to this area on follow-up MRI is advised.  Talk to your primary care doctor about obtaining an outpatient MRI to further evaluate these findings.  Your MRI showed the following: -At L3-L4, there is a disc bulge. Superimposed broad-based right  foraminal/extraforaminal disc protrusion (at site of posterior annular fissure). The disc protrusion results in mild right inferior neural foraminal narrowing and may contact the exiting right L3 nerve root beyond the neural foramen. Correlate for right L3 radiculopathy. Mild degenerative endplate edema and enhancement on the right.  Discuss these findings and your recent symptoms further with your spine doctor appointment later this month.  Return to the emergency department for any new or worsening symptoms of concern.

## 2024-01-11 NOTE — ED Triage Notes (Signed)
 Pt c.o right sided pain for a while now, pt had a bad fall in May and is still recovering from that. Pt was seen at his PCP last week and had xrays done. Pt was instructed to come here to rule out diverticulitis or appendicitis. Pt denies abd pain. Xray of hip shows possible herniated disk and recommended spinal imaging.

## 2024-01-11 NOTE — ED Notes (Signed)
 Pt given orange juice. Will recheck CBG in 15 minutes.

## 2024-01-13 ENCOUNTER — Encounter: Payer: Self-pay | Admitting: Rehabilitative and Restorative Service Providers"

## 2024-01-13 ENCOUNTER — Ambulatory Visit: Admitting: Rehabilitative and Restorative Service Providers"

## 2024-01-13 DIAGNOSIS — M5459 Other low back pain: Secondary | ICD-10-CM

## 2024-01-13 DIAGNOSIS — R2689 Other abnormalities of gait and mobility: Secondary | ICD-10-CM

## 2024-01-13 DIAGNOSIS — M6281 Muscle weakness (generalized): Secondary | ICD-10-CM

## 2024-01-13 DIAGNOSIS — R252 Cramp and spasm: Secondary | ICD-10-CM

## 2024-01-13 NOTE — Therapy (Signed)
 OUTPATIENT PHYSICAL THERAPY TREATMENT NOTE   Patient Name: John [name] MRN: [id number] DOB:[birth year], 68 y.o., male Today's Date: 01/13/2024  END OF SESSION:  PT End of Session - 01/13/24 1532     Visit Number 3    Date for PT Re-Evaluation 03/05/24    Authorization Type [employer] approved 01/09/2024 - 03/05/2024    Authorization - Visit Number 2    Authorization - Number of Visits 16    Progress Note Due on Visit 10    PT Start Time 1530    PT Stop Time 1610    PT Time Calculation (min) 40 min    Activity Tolerance Patient limited by pain    Behavior During Therapy [facility] for tasks assessed/performed           Past Medical History:  Diagnosis Date   Anxiety    Diabetes (HCC)    Foot pain left   to have MRI tues.    Hepatitis C    High cholesterol    Past Surgical History:  Procedure Laterality Date   None     Patient Active Problem List   Diagnosis Date Noted   Hypovolemia due to dehydration 07/31/2022   Uncontrolled type 1 diabetes mellitus with hyperglycemia (HCC) 03/24/2020   Chronic motor tic 04/28/2018   Vertigo 02/25/2018   Chronic right hip pain 12/11/2016   Liver cirrhosis (HCC) 11/10/2015   Adjustment disorder with mixed anxiety and depressed mood 06/01/2015   Secondary esophageal varices without bleeding (HCC) 12/15/2014   Chronic dental infection 12/11/2014   Thrombocytopenia (HCC) 11/11/2014   Anatomical narrow angle, bilateral 12/20/2013   Tic 09/28/2013   Tremors of nervous system 07/01/2013   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   Diabetes (HCC)    High cholesterol    Anxiety    Hepatitis C    Skin pustule 12/01/2012   Cramps, extremity 10/17/2012   Vitamin D insufficiency 05/14/2012   Hematochezia 03/17/2012   Male hypogonadism 08/20/2011   Diabetic neuropathy (HCC) 08/14/2011   Onychomycosis due to dermatophyte 08/14/2011   Pancytopenia (HCC) 06/06/2011   Depression 03/19/2011    PCP: Lenon Boyer  REFERRING PROVIDER: Donata Clapper, PA  REFERRING DIAG:  Diagnosis  M51.26 (ICD-10-CM) - Other intervertebral disc displacement, lumbar region    THERAPY DIAG:  Other low back pain  Cramp and spasm  Other abnormalities of gait and mobility  Muscle weakness (generalized)  Rationale for Evaluation and Treatment: Rehabilitation  ONSET DATE: 10/2023- Lt rib fracture   SUBJECTIVE:   SUBJECTIVE STATEMENT: Pt states that he is walking better with the walker.  States the MD told him to go to the ED on Saturday for imaging to rule out appendicitis or diverticulitis.  Patient received new imaging (see below) with a diagnosis of sciatica.  PERTINENT HISTORY: Diabetes, vertigo, motor tic/tremors, depression, diabetic neuropathy  PAIN:  Are you having pain? Yes: NPRS scale: avg 5/10 Pain location: Rt low back to Rt gluteals  Pain description: it kills me, intense Aggravating factors: moving, bed mobility, standing with Rt=Lt (not able), sitting (not able) Relieving factors: shifting off of Rt LE to the Lt, warm bath   PRECAUTIONS: Fall  RED FLAGS: None   WEIGHT BEARING RESTRICTIONS: No  FALLS:  Has patient fallen in last 6 months? Yes. Number of falls many times-PT will address balance   LIVING ENVIRONMENT: Lives with: lives with their spouse Lives in: House/apartment Stairs: Yes: External: 3 steps; on right going up Has following equipment at home: Single point cane,  Walker - 2 wheeled, Wheelchair (manual), and Ramped entry  OCCUPATION: does not work   PLOF: Independent, Independent with household mobility with device, and Leisure: walk for exercise  PATIENT GOALS: walk without pain, reduce pain, sit longer, go to music festival in September   NEXT MD VISIT: specialist at [facility] 01/29/24  OBJECTIVE:  Note: Objective measures were completed at Evaluation unless otherwise noted.  DIAGNOSTIC FINDINGS:  Lumbar x-ray 01/08/24 FINDINGS: No acute fracture.  No significant listhesis.  Moderate  degenerative disc and facet changes at L5-S1, mild at the remaining levels.  Additional findings: Vascular calcifications. Increased stool burden.  Rt hip:  FINDINGS:  No acute fracture dislocation. 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 The adjacent soft tissues are unremarkable.    Lumbar MRI on 01/11/2024: IMPRESSION: 1. Lumbar spondylosis as outlined within the body of the report. 2. At L3-L4, there is a disc bulge. Superimposed broad-based right foraminal/extraforaminal disc protrusion (at site of posterior annular fissure). The disc protrusion results in mild right inferior neural foraminal narrowing and may contact the exiting right L3 nerve root beyond the neural foramen. Correlate for right L3 radiculopathy. Mild degenerative endplate edema and enhancement on the right. 3. No significant spinal canal or foraminal stenosis at the remaining levels. 4. Levocurvature of the lumbar spine.   PATIENT SURVEYS:  01/09/24: Modified Oswestry: 30/50=60% disability    COGNITION: Overall cognitive status: Within functional limits for tasks assessed     SENSATION: WFL   MUSCLE LENGTH: Unable to test due to irritability of symptoms   POSTURE: rounded shoulders, forward head, and weight shift left  PALPATION: Palpable tenderness over Lt lumbar paraspinals and gluteals.   LOWER EXTREMITY ROM:  Unable to test due to irritability of symptoms   LOWER EXTREMITY MMT: Pain with resisted testing in Rt gluteals. At least 4/5 in bil hips and knees    FUNCTIONAL TESTS:  01/09/24 Timed up and go (TUG): 30.72 seconds with cane   01/13/2024: Timed up and go (TUG):  24.79 sec with RW 3 minute walk:  329 ft with RW and RPE of 6/10  GAIT: Distance walked: 25 feet  Assistive device utilized: Single point cane Level of assistance: SBA Comments: instability, asymmetry with reduced time spent on Rt LE, guarded movement                                                                                                                         TREATMENT DATE:   01/13/2024: Nustep level 3 x6 min with PT present to discuss status (pt cues for slow/easy speed) Timed Up and Go with RW Seated hamstring stretch 2x20 sec bilat (with cuing for improved technique and to not over-do the stretch) Seated hip adduction ball squeeze x10 with 5 sec hold Seated transversus abdominus contraction by pressing ball into thighs x10 with 5 sec hold 3 minute walk:  329 ft with RW and RPE of 6/10 Seated hip abduction clamshells with  blue loop 2x10 (pt reported as easy resistance) Seated marching with blue loop 2x5 Seated long arc quad x10 bilat   01/10/24: Pt arrives for aquatic physical therapy. Treatment took place in 3.5-5.5 feet of water. Water temperature was 91 degrees F. Pt entered the pool via. Seated water bench with 75% submersion Pt performed seated LE AROM exercises 10x in all planes, concurrent discussion of water principles and how we will use them with exercise.Pt verbally understood. Standing heel raises bil 6x. Marching 6x Bil: holding on wall heavily. Pt's pain was increasing,he was getting agitated so we ended session early.       PATIENT EDUCATION:  Education details: [email or web address], Environmental education officer Person educated: Patient Education method: Explanation, Demonstration, and Handouts Education comprehension: verbalized understanding and returned demonstration  HOME EXERCISE PROGRAM: Access Code: [email or web address] URL: [email or web address] Date: 01/13/2024 Prepared by: Jarrell Consandra Laske  Exercises - Supine Lower Trunk Rotation  - 3 x daily - 7 x weekly - 1 sets - 3 reps - 20 hold - Hooklying Single Knee to Chest Stretch  - 3 x daily - 7 x weekly - 1 sets - 3 reps - 20 hold - Supine Hip Adduction Isometric with Ball  - 3 x daily - 7 x weekly - 1-2 sets - 10 reps - 5 hold - Seated Hamstring Stretch  - 3 x daily - 7 x weekly - 1 sets - 3 reps - 20  hold - Seated Isometric Hip Adduction with Ball  - 1 x daily - 7 x weekly - 1-2 sets - 10 reps - 5 sec hold - Seated Transversus Abdominis Bracing  - 1 x daily - 7 x weekly - 3 sets - 10 reps - Seated Hip Abduction with Resistance  - 1 x daily - 7 x weekly - 2 sets - 10 reps - Seated March with Resistance  - 1 x daily - 7 x weekly - 2-3 sets - 5 reps - Seated Long Arc Quad  - 1 x daily - 7 x weekly - 2 sets - 10 reps  ASSESSMENT:  CLINICAL IMPRESSION: Mr Odriscoll presents to skilled PT reporting that he went to the ED on Saturday, as his PCP wanted to rule out a possible appendectomy or diverticulitis.  Patient has new imaging, including a lumbar MRI, which revealed a disc bulge.  Patient does report that he's been compliant with his HEP, but states that the hamstring stretch is the most difficulty.  Patient provided with some cuing for improved technique during session and for a more gentle stretch and he reported that he tolerated it better.  Patient with improved time on TUG and was able to participate in a 3 minute walk test with his RW.  Patient provided with an updated HEP and a red theraband for home use.  Patient with great participation today and tolerated session better.  Patient continues to require skilled PT to progress towards goal related activities.  OBJECTIVE IMPAIRMENTS: Abnormal gait, decreased activity tolerance, decreased balance, decreased endurance, decreased mobility, difficulty walking, decreased strength, decreased safety awareness, increased muscle spasms, impaired flexibility, improper body mechanics, postural dysfunction, and pain.   ACTIVITY LIMITATIONS: carrying, lifting, bending, sitting, standing, squatting, sleeping, stairs, transfers, bed mobility, bathing, toileting, dressing, hygiene/grooming, and locomotion level  PARTICIPATION LIMITATIONS: meal prep, cleaning, laundry, shopping, and community activity  PERSONAL FACTORS: 1-2 comorbidities: fall with rib  fracture, lumbar DDD are also affecting patient's functional outcome.   REHAB POTENTIAL: Good  CLINICAL DECISION MAKING: Stable/uncomplicated  EVALUATION COMPLEXITY:  Low   GOALS: Goals reviewed with patient? Yes  SHORT TERM GOALS: Target date: 02/06/2024   Be independent in initial HEP Baseline: Goal status: Ongoing  2.  Sit with Rt=Lt weight bearing x 5 minutes due to reduced Rt LE pain Baseline:  Goal status: INITIAL  3.  Ambulate with rolling walker x 5-7 minutes with neutral posture/weightbearing for household function  Baseline:  Goal status: Ongoing  4.  Report > or = to 20% reduction in Rt LE pain to improve function with ADLs and self-care Baseline:  Goal status: INITIAL    LONG TERM GOALS: Target date: 03/05/2024    Be independent in advanced HEP Baseline:  Goal status: INITIAL  2.  Improve Modified Oswestry to < or = to 48% disability  Baseline: 60% disability (30/50) Goal status: INITIAL  3.  Improve TUG to < or = to 16 seconds to reduce falls risk  Baseline: 30 seconds  Goal status: Ongoing  4.  Sit with Rt=Lt weight bearing x 20 minutes due to improve riding in the car Baseline:  Goal status: INITIAL  5.  Report > or = to 40% reduction in Rt LE pain with self-care and ADLs  Baseline:  Goal status: INITIAL  6.  Stand with Rt=Lt weightbearing x 10 min for ADLs due to reduced pain Baseline:  Goal status: INITIAL   PLAN:  PT FREQUENCY: 2x/week  PT DURATION: 8 weeks  PLANNED INTERVENTIONS: 97110-Therapeutic exercises, 97530- Therapeutic activity, 97112- Neuromuscular re-education, 97535- Self Care, [id number]- Manual therapy, [phone]- Gait training, [phone]- Canalith repositioning, [id number]- Aquatic Therapy, [phone]- Electrical stimulation (unattended), [phone]- Electrical stimulation (manual), [id number]- Vasopneumatic device, [id number]- Ultrasound, [id number]- Traction (mechanical), [id number] (1-2 muscles), [id number] (3+ muscles)- Dry Needling, Patient/Family education, Balance  training, Taping, Joint mobilization, Joint manipulation, Spinal manipulation, Spinal mobilization, Vestibular training, and Cryotherapy  PLAN FOR NEXT SESSION: gait with walker and focus on symmetry, gentle mobility and advance as able, aquatics and land   Becker, Lambert, DPT 01/13/24, 4:17 PM  [facility] [address] [place], [place] [address] Phone # [phone] Fax [phone]

## 2024-01-15 ENCOUNTER — Encounter: Payer: Self-pay | Admitting: Physical Therapy

## 2024-01-15 ENCOUNTER — Ambulatory Visit: Admitting: Physical Therapy

## 2024-01-15 DIAGNOSIS — R2689 Other abnormalities of gait and mobility: Secondary | ICD-10-CM

## 2024-01-15 DIAGNOSIS — R252 Cramp and spasm: Secondary | ICD-10-CM

## 2024-01-15 DIAGNOSIS — M5459 Other low back pain: Secondary | ICD-10-CM

## 2024-01-15 DIAGNOSIS — M6281 Muscle weakness (generalized): Secondary | ICD-10-CM

## 2024-01-15 NOTE — Therapy (Signed)
 OUTPATIENT PHYSICAL THERAPY TREATMENT NOTE   Patient Name: John [name] MRN: [id number] DOB:[birth year], 68 y.o., male Today's Date: 01/15/2024  END OF SESSION:  PT End of Session - 01/15/24 0918     Visit Number 4    Date for PT Re-Evaluation 03/05/24    Authorization Type [employer] approved 01/09/2024 - 03/05/2024    Authorization - Visit Number 3    Authorization - Number of Visits 16    Progress Note Due on Visit 10    PT Start Time 0918    PT Stop Time 0956    PT Time Calculation (min) 38 min    Activity Tolerance Patient tolerated treatment well    Behavior During Therapy [facility] for tasks assessed/performed            Past Medical History:  Diagnosis Date   Anxiety    Diabetes (HCC)    Foot pain left   to have MRI tues.    Hepatitis C    High cholesterol    Past Surgical History:  Procedure Laterality Date   None     Patient Active Problem List   Diagnosis Date Noted   Hypovolemia due to dehydration 07/31/2022   Uncontrolled type 1 diabetes mellitus with hyperglycemia (HCC) 03/24/2020   Chronic motor tic 04/28/2018   Vertigo 02/25/2018   Chronic right hip pain 12/11/2016   Liver cirrhosis (HCC) 11/10/2015   Adjustment disorder with mixed anxiety and depressed mood 06/01/2015   Secondary esophageal varices without bleeding (HCC) 12/15/2014   Chronic dental infection 12/11/2014   Thrombocytopenia (HCC) 11/11/2014   Anatomical narrow angle, bilateral 12/20/2013   Tic 09/28/2013   Tremors of nervous system 07/01/2013   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   Diabetes (HCC)    High cholesterol    Anxiety    Hepatitis C    Skin pustule 12/01/2012   Cramps, extremity 10/17/2012   Vitamin D insufficiency 05/14/2012   Hematochezia 03/17/2012   Male hypogonadism 08/20/2011   Diabetic neuropathy (HCC) 08/14/2011   Onychomycosis due to dermatophyte 08/14/2011   Pancytopenia (HCC) 06/06/2011   Depression 03/19/2011    PCP: Lenon Boyer  REFERRING PROVIDER:  Donata Clapper, PA  REFERRING DIAG:  Diagnosis  M51.26 (ICD-10-CM) - Other intervertebral disc displacement, lumbar region    THERAPY DIAG:  Other low back pain  Cramp and spasm  Other abnormalities of gait and mobility  Muscle weakness (generalized)  Rationale for Evaluation and Treatment: Rehabilitation  ONSET DATE: 10/2023- Lt rib fracture   SUBJECTIVE:   SUBJECTIVE STATEMENT: Much better with the walker: better but not all better.  PERTINENT HISTORY: Diabetes, vertigo, motor tic/tremors, depression, diabetic neuropathy  PAIN:  Are you having pain? Yes: NPRS scale: avg 5/10 Pain location: Rt low back to Rt gluteals  Pain description: it kills me, intense Aggravating factors: moving, bed mobility, standing with Rt=Lt (not able), sitting (not able) Relieving factors: shifting off of Rt LE to the Lt, warm bath   PRECAUTIONS: Fall  RED FLAGS: None   WEIGHT BEARING RESTRICTIONS: No  FALLS:  Has patient fallen in last 6 months? Yes. Number of falls many times-PT will address balance   LIVING ENVIRONMENT: Lives with: lives with their spouse Lives in: House/apartment Stairs: Yes: External: 3 steps; on right going up Has following equipment at home: Single point cane, Walker - 2 wheeled, Wheelchair (manual), and Ramped entry  OCCUPATION: does not work   PLOF: Independent, Independent with household mobility with device, and Leisure: walk for exercise  PATIENT GOALS:  walk without pain, reduce pain, sit longer, go to music festival in September   NEXT MD VISIT: specialist at [facility] 01/29/24  OBJECTIVE:  Note: Objective measures were completed at Evaluation unless otherwise noted.  DIAGNOSTIC FINDINGS:  Lumbar x-ray 01/08/24 FINDINGS: No acute fracture.  No significant listhesis.  Moderate degenerative disc and facet changes at L5-S1, mild at the remaining levels.  Additional findings: Vascular calcifications. Increased stool burden.  Rt hip:   FINDINGS:  No acute fracture dislocation. 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 The adjacent soft tissues are unremarkable.    Lumbar MRI on 01/11/2024: IMPRESSION: 1. Lumbar spondylosis as outlined within the body of the report. 2. At L3-L4, there is a disc bulge. Superimposed broad-based right foraminal/extraforaminal disc protrusion (at site of posterior annular fissure). The disc protrusion results in mild right inferior neural foraminal narrowing and may contact the exiting right L3 nerve root beyond the neural foramen. Correlate for right L3 radiculopathy. Mild degenerative endplate edema and enhancement on the right. 3. No significant spinal canal or foraminal stenosis at the remaining levels. 4. Levocurvature of the lumbar spine.   PATIENT SURVEYS:  01/09/24: Modified Oswestry: 30/50=60% disability    COGNITION: Overall cognitive status: Within functional limits for tasks assessed     SENSATION: WFL   MUSCLE LENGTH: Unable to test due to irritability of symptoms   POSTURE: rounded shoulders, forward head, and weight shift left  PALPATION: Palpable tenderness over Lt lumbar paraspinals and gluteals.   LOWER EXTREMITY ROM:  Unable to test due to irritability of symptoms   LOWER EXTREMITY MMT: Pain with resisted testing in Rt gluteals. At least 4/5 in bil hips and knees    FUNCTIONAL TESTS:  01/09/24 Timed up and go (TUG): 30.72 seconds with cane   01/13/2024: Timed up and go (TUG):  24.79 sec with RW 3 minute walk:  329 ft with RW and RPE of 6/10  GAIT: Distance walked: 25 feet  Assistive device utilized: Single point cane Level of assistance: SBA Comments: instability, asymmetry with reduced time spent on Rt LE, guarded movement                                                                                                                        TREATMENT DATE:   01/15/24: Pt arrives for aquatic physical  therapy. Treatment took place in 3.5-5.5 feet of water. Water temperature was 91 degrees F. Pt entered the pool via side stepping down stairs holding the rails with Bil UE.SABRA Seated water bench with 75% submersion Pt performed seated LE AROM exercises 10x in all planes, concurrent discussion of current status. Semi-sit arms forward and back sitting up tall 10x.  Standing heel raises bil 10x. Marching 6x, hip abd 10x  Bil: holding on wall less heavily than last session. Water walking holding yellow noodle from bench seat to other end of bench seat incase he needed to sit quickly: forward and backward4x, seated rest 1  min, then side stepping same place 2x. Pt gets a little lightheaded, requires VC to breath and definitely needs seated rest breaks. Standing against the wall at 50% depth for kickboard rows 10x.   01/13/2024: Nustep level 3 x6 min with PT present to discuss status (pt cues for slow/easy speed) Timed Up and Go with RW Seated hamstring stretch 2x20 sec bilat (with cuing for improved technique and to not over-do the stretch) Seated hip adduction ball squeeze x10 with 5 sec hold Seated transversus abdominus contraction by pressing ball into thighs x10 with 5 sec hold 3 minute walk:  329 ft with RW and RPE of 6/10 Seated hip abduction clamshells with blue loop 2x10 (pt reported as easy resistance) Seated marching with blue loop 2x5 Seated long arc quad x10 bilat   01/10/24: Pt arrives for aquatic physical therapy. Treatment took place in 3.5-5.5 feet of water. Water temperature was 91 degrees F. Pt entered the pool via. Seated water bench with 75% submersion Pt performed seated LE AROM exercises 10x in all planes, concurrent discussion of water principles and how we will use them with exercise.Pt verbally understood. Standing heel raises bil 6x. Marching 6x Bil: holding on wall heavily. Pt's pain was increasing,he was getting agitated so we ended session early.       PATIENT EDUCATION:   Education details: [email or web address], Environmental education officer Person educated: Patient Education method: Explanation, Demonstration, and Handouts Education comprehension: verbalized understanding and returned demonstration  HOME EXERCISE PROGRAM: Access Code: [email or web address] URL: [email or web address] Date: 01/13/2024 Prepared by: Jarrell Menke  Exercises - Supine Lower Trunk Rotation  - 3 x daily - 7 x weekly - 1 sets - 3 reps - 20 hold - Hooklying Single Knee to Chest Stretch  - 3 x daily - 7 x weekly - 1 sets - 3 reps - 20 hold - Supine Hip Adduction Isometric with Ball  - 3 x daily - 7 x weekly - 1-2 sets - 10 reps - 5 hold - Seated Hamstring Stretch  - 3 x daily - 7 x weekly - 1 sets - 3 reps - 20 hold - Seated Isometric Hip Adduction with Ball  - 1 x daily - 7 x weekly - 1-2 sets - 10 reps - 5 sec hold - Seated Transversus Abdominis Bracing  - 1 x daily - 7 x weekly - 3 sets - 10 reps - Seated Hip Abduction with Resistance  - 1 x daily - 7 x weekly - 2 sets - 10 reps - Seated March with Resistance  - 1 x daily - 7 x weekly - 2-3 sets - 5 reps - Seated Long Arc Quad  - 1 x daily - 7 x weekly - 2 sets - 10 reps  ASSESSMENT:  CLINICAL IMPRESSION: Much improved since Friday appt. Using walker very helpful for energy conservation as well as pain management. He easily doubled the amount of work today compared to his first visit and pain was not as much as a barrier as was fatigue. Still tolerates what I would call a low level but improving.   OBJECTIVE IMPAIRMENTS: Abnormal gait, decreased activity tolerance, decreased balance, decreased endurance, decreased mobility, difficulty walking, decreased strength, decreased safety awareness, increased muscle spasms, impaired flexibility, improper body mechanics, postural dysfunction, and pain.   ACTIVITY LIMITATIONS: carrying, lifting, bending, sitting, standing, squatting, sleeping, stairs, transfers, bed mobility, bathing, toileting, dressing,  hygiene/grooming, and locomotion level  PARTICIPATION LIMITATIONS: meal prep, cleaning, laundry, shopping, and community activity  PERSONAL FACTORS: 1-2 comorbidities:  fall with rib fracture, lumbar DDD are also affecting patient's functional outcome.   REHAB POTENTIAL: Good  CLINICAL DECISION MAKING: Stable/uncomplicated  EVALUATION COMPLEXITY: Low   GOALS: Goals reviewed with patient? Yes  SHORT TERM GOALS: Target date: 02/06/2024   Be independent in initial HEP Baseline: Goal status: Ongoing  2.  Sit with Rt=Lt weight bearing x 5 minutes due to reduced Rt LE pain Baseline:  Goal status: INITIAL  3.  Ambulate with rolling walker x 5-7 minutes with neutral posture/weightbearing for household function  Baseline:  Goal status: Ongoing  4.  Report > or = to 20% reduction in Rt LE pain to improve function with ADLs and self-care Baseline:  Goal status: INITIAL    LONG TERM GOALS: Target date: 03/05/2024    Be independent in advanced HEP Baseline:  Goal status: INITIAL  2.  Improve Modified Oswestry to < or = to 48% disability  Baseline: 60% disability (30/50) Goal status: INITIAL  3.  Improve TUG to < or = to 16 seconds to reduce falls risk  Baseline: 30 seconds  Goal status: Ongoing  4.  Sit with Rt=Lt weight bearing x 20 minutes due to improve riding in the car Baseline:  Goal status: INITIAL  5.  Report > or = to 40% reduction in Rt LE pain with self-care and ADLs  Baseline:  Goal status: INITIAL  6.  Stand with Rt=Lt weightbearing x 10 min for ADLs due to reduced pain Baseline:  Goal status: INITIAL   PLAN:  PT FREQUENCY: 2x/week  PT DURATION: 8 weeks  PLANNED INTERVENTIONS: 97110-Therapeutic exercises, 97530- Therapeutic activity, 97112- Neuromuscular re-education, 97535- Self Care, [id number]- Manual therapy, [phone]- Gait training, [phone]- Canalith repositioning, [id number]- Aquatic Therapy, [phone]- Electrical stimulation (unattended), [phone]- Electrical  stimulation (manual), [id number]- Vasopneumatic device, [id number]- Ultrasound, [id number]- Traction (mechanical), [id number] (1-2 muscles), [id number] (3+ muscles)- Dry Needling, Patient/Family education, Balance training, Taping, Joint mobilization, Joint manipulation, Spinal manipulation, Spinal mobilization, Vestibular training, and Cryotherapy  PLAN FOR NEXT SESSION: gait with walker and focus on symmetry, gentle mobility and advance as able, aquatics and land  Delon Darner, PTA 01/15/24 10:09 AM     [facility] [address] [place], [place] [address] Phone # [phone] Fax [phone]

## 2024-01-17 NOTE — Progress Notes (Addendum)
 [facility]  New Patient Visit    Referring Provider [place], [place] Ade*   Assessment   John [name] is a 68 y.o. male with PMH significant for  has a past medical history of Cirrhosis of liver (*), Diabetes mellitus (*), and Hemochromatosis. referred from [place], Kirstin Ade* presenting for new patient consultation.   History of Present Illness The patient is a 68 year old male who presents today for a new patient evaluation for hip pain. The pain is located in the right lower back area and radiates to gluteal area. The pain started in May 2025 following a fall. that resulted in three fractured ribs. Concurrently, he developed pain in his right leg.   In early July 2025, he had another incident that exacerbated his condition. Two weeks later, he experienced severe pain that rendered him unable to walk or roll over in bed. This episode occurred two weeks ago. He describes the pain as originating from his buttock muscle and radiating across his back. Associated symptoms include: weakness,  leg pain and instability.  He reports no numbness but mentions that his leg is unstable and unreliable. He has had multiple falls in the past year due to his leg giving way. He has been attending physical therapy for his back pain for the past two weeks, three times a week. Current medication regimen includes gabapentin  which he has been on for over five years for neuropathy and muscle spasms.  He is diabetic and insulin -dependent. His A1c was 7.2. He is not on any blood thinners or antibiotics.    1. Lumbar radiculopathy      2. Falls        Plan      1.  Urine drug screen was not obtained.  2. [facility] Reporting is reviewed today and consistent with current reported medication and prior physicians.      Narcotic Violations: none Current Medication Regimen -stable Side effects -minimal   Activity Level- adequate [employer] Behavior- nil Narcotic  Contract- not obtained  3. Opioid risk tool score:   - 1 low risk         OPIOID RISK TOOL  More data may exist      01/17/2024  Opioid Risk Tool  Family history alcohol 0  Family history illegal drugs 0  Family history rx drugs 0  Personal history alcohol 0  Personal history illegal drugs 0  Personal history rx drugs 0  Age between 16-45 years 1*  History of preadolescent sexual abuse No  ADD, OCD, bipolar, schizophrenia 0  Depression 0  Scoring Total 1 *  Interpretation Low risk for opioid abuse *    Details      * Patient-reported             4. Opioid Medications:  - none prescribed      5. Adjuvant Medications: - none   - Optimize conservative measures including heat/ice therapy to affected site, acetaminophen  and NSAIDs as needed and per label recommendations, and trial OTC lidocaine patches.  6. Specialized treatments:   - Currently in physical therapy  7.  Imaging:   - None request push over MRI Consider NSG referral to evaluate instability falls in no benefit/ once MRU reviewed  8.  Procedures:  - Right L3-4 TFESI   - Risks and benefits of procedure including but not limited to bleeding, infection, nerve damage, and damage to surrounding structures discussed with patient and all questions were answered to his satisfaction.  9. Follow up for Right L4-S1 TFESI ASAP, follow up 2 weeks after procedure.  10. The patient's blood pressure was taken and reported as  (!) 98/54   History of Present Illness: Chief Complaint:  Chief Complaint  Patient presents with  . low back pain   The most significant area of pain is left loe back. The pain began 1 year and has been present >3 months. Patient denies numbness/tingling and reports weakness.   Patient is not incontinent of bowel/bladder. The pain is described as throbbing, sharp, and constant. The patient's pain score is rated as 6/10 on average over the last 7 days. The pain improves and is better with  nothing. The pain is made worse with bending, lifting, and getting out of seat. The patient is unable to do the following activities 2/2 pain: movement.  Denies symptoms including fevers, chills, constipation, nausea, vomiting, lightheadedness, dizziness, or excessive sedation. Denies any side effects to current medication regimen.    Previous Pain Course/Interventions: Has PT been completed for this pain complaint? yes  - Location of PT if completed outside of [facility]:  Has chiropractic been completed for this pain complaint?   Medications utilized:  NSAID's: none Muscle Relaxants: none Anti-Depressants: none Membrane Stabilizers: gabapentin  (Neurontin ) Opioids: none Other:   Procedures: No previous spine procedures  Diagnostic Studies: (I personally reviewed all applicable diagnostic imaging pertinent to this visit): MRI Lumbar 01/11/24   FINDINGS:  Segmentation: Five lumbar vertebrae. The caudal-most well-formed  intervertebral disc space is designated L5-S1.   Alignment: Levocurvature of the lumbar spine. No significant  spondylolisthesis.   Vertebrae: No lumbar vertebral compression fracture. Mild  degenerative endplate edema and enhancement on the right at L3-L4.   Conus medullaris and cauda equina: Conus extends to the L1-L2.  Level. No signal abnormality identified within the visualized distal  spinal cord. No pathologic enhancement of the visualized distal  spinal cord or cauda equina nerve roots.   Paraspinal and other soft tissues: No paraspinal mass or collection.  Abdominopelvic soft tissue findings separately reported on same day  CT abdomen/pelvis.   Disc levels:   Mild-to-moderate disc degeneration at L5-S1. Disc desiccation  without significant disc height loss at the remaining lumbar levels.   T12-L1: No significant disc herniation or stenosis.   L1-L2: Slight disc bulge. No significant spinal canal or foraminal  stenosis.   L2-L3: No  significant disc herniation or stenosis.   L3-L4: Disc bulge. Superimposed broad-based right  foraminal/extraforaminal disc protrusion (at site of posterior  annular fissure) (for instance as seen on series 6, image 33). No  significant spinal canal stenosis. The disc protrusion results in  mild right inferior neural foraminal narrowing and could contact the  exiting right L3 nerve root beyond the neural foramen.   L4-L5: Central posterior annular fissure. Disc bulge. Facet  hypertrophy. No significant spinal canal or foraminal stenosis.   L5-S1: Mild endplate spurring. Mild facet arthropathy on the left.  No significant spinal canal or foraminal stenosis.   IMPRESSION:  1. Lumbar spondylosis as outlined within the body of the report.  2. At L3-L4, there is a disc bulge. Superimposed broad-based right  foraminal/extraforaminal disc protrusion (at site of posterior  annular fissure). The disc protrusion results in mild right inferior  neural foraminal narrowing and may contact the exiting right L3  nerve root beyond the neural foramen. Correlate for right L3  radiculopathy. Mild degenerative endplate edema and enhancement on  the right.  3. No significant spinal canal  or foraminal stenosis at the  remaining levels.  4. Levocurvature of the lumbar spine.   XR right hip 01/07/24   FINDINGS:   No acute fracture dislocation. 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 The adjacent soft tissues are unremarkable.      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 Current Home Medications  Medication Sig Last Dose  ACCU-CHEK AVIVA PLUS test  strip SMARTSIG:1 Strip(s) Via Meter 5 Times Daily   albuterol sulfate HFA 108 (90 BASE) MCG/ACT inhaler Inhale two puffs into the lungs every 4 (four) hours as needed for Wheezing.   Continuous Blood Gluc Receiver (FREESTYLE LIBRE 14 DAY READER) DEVI by Does not apply route.   Continuous Blood Gluc Sensor (FREESTYLE LIBRE 2 SENSOR SYSTM) MISC USE AS DIRECTED FOR CONTINUOUS GLUCOSE MONITORING. CHANGE EVERY 14 DAYS.   diclofenac  sodium (VOLTAREN) 1 % gel Apply topically 4 (four) times a day as needed.   gabapentin  (NEURONTIN ) 100 mg capsule TAKE 1 CAPSULE BY MOUTH ONCE DAILY AS NEEDED (TOURETTES  EXACERBATION)   gabapentin  (NEURONTIN ) 300 mg capsule Take 1 capsule by mouth at bedtime   glucagon 1 MG injection Inject into the vein.   insulin  aspart (NOVOLOG ) 100 unit/mL injection Inject eight Units into the skin 15 (fifteen) minutes before meals. Sliding scale   insulin  glargine (LANTUS) 100 Unit/mL injection Inject thirty four Units into the skin daily.   Insulin  Pen Needle (BD PEN NEEDLE SHORT U/F) 31G X 8 MM MISC USE  4 TIMES DAILY FOR  INJECTIONS   lidocaine (LIDODERM) 5% Place one patch onto the skin daily. Remove & Discard patch within 12 hours or as directed by MD   Misc. Devices MISC DX: Spinal stenosis RX  folding walker with wheels       Problem list: Problem list reviewed and updated at today's visit Problem List[1]    OBJECTIVE: Body mass index is 22.16 kg/m.  Physical Exam:  Vitals:   01/17/24 1255  BP: (!) 98/54  Pulse: 83  Temp: 97.3 F (36.3 C)  TempSrc: Temporal  SpO2: 97%  Weight: 168 lb (76.2 kg)  Height: 6' 1 (1.854 m)    GENERAL/PSYCHIATRIC:  Patient is alert, oriented, answers questions appropriately. In no apparent distress. Interpersonal behavior socially appropriate. Mood appropriate to interview. HEENT:  Normocepalic, conjuctiva clear, mucous membranes moist. SKIN:  warm and dry. CARDIOVASCULAR:  Well perfused. No evidence of cyanosis/edema.    RESPIRATORY:  Respirations symetrical and without exertion. ABDOMEN:  soft. EXTREMITIES: Upper and lower extremity motor strength appears to be grossly intact. Grip strength is intact. NEUROLOGICAL:  No gross sensory deficits noted.  SPINE: straight GAIT: antalgic and stiff-legged  Sensory: Fine sensation: Normal in UE and LE, Bilaterally symmetric Temperature sensation: Grossly normal in UE and LE, Bilaterally symmetric  Pain (Neuro/Musculoskeletal):  Physical Exam Musculoskeletal: Tenderness noted in the right buttock and right hip area. Pain radiates from the right side of the back into the buttock and right hip area. Weakness in the right leg noted. No numbness reported.  Back: decreased range of motion, without paraspinal tenderness, with significant pain with facet loading b/l (spine extension and rotation), without Q/L muscle tenderness, positive straight leg raise  bilaterally Sacral:  FABER positive for posterior pain, positive SI joint compression test, negative Gaenslen's test bilaterally, negative Yeoman's test bilaterally    Peripheral Joints:  Hip: normal ROM with pain. Bilateral greater trochanteric bursas are non-tender. without groin pain.    to varus and valgus forces. Lachman sign is negative bilaterally.  Patient's pain was assessed, documented as positive, unless stated in the HPI, and a follow up plan has been documented in the Plan. Patient's medication list was reviewed and updated if applicable.  Body Mass Index (BMI) screening was documented and if the patient's BMI was greater than 25 or less than 18.5, the patient was asked to follow up with their PCP for a full assessment regarding weight management. If Fluoroscopy was used during a procedure, the radiation exposure time and number of images were documented within the record.   Treatment plan fully discussed and agreed upon with patient. All questions were answered. I attest that during this clinical  encounter, I confirmed written consent and received verbal authorization to use [employer] to ambiently capture, record, and transcribe the conversation between myself and John [name]. The transcript generated by [employer] was reviewed for accuracy and completeness, and accurately reflects the context of the clinical interaction. Documentation for time-based billing:  Total time spent of date of service was 45 minutes.  Patient care activities included preparing to see the patient such as reviewing the patient record, obtaining and/or reviewing separately obtained history, performing a medically appropriate history and physical examination, counseling and educating the patient, family, and/or caregiver, ordering prescription medications, tests, or procedures, and documenting clinical information in the electronic or other health record.  Joyce Brunson ,DNP             [1] Patient Active Problem List Diagnosis  . Hepatitis C virus infection without hepatic coma  . Hereditary hemochromatosis  . Tourette disorder  . Secondary esophageal varices without bleeding (*)  . Adjustment disorder with mixed anxiety and depressed mood  . Chronic right hip pain  . Uncontrolled type 1 diabetes mellitus with hyperglycemia (*)  . Unspecified cirrhosis of liver (*)  . Vitamin D insufficiency  . Thrombocytopenia  . Pancytopenia (*)  . Onychomycosis due to dermatophyte  . Lumbar radiculopathy  . Falls  *Some images could not be shown.

## 2024-01-21 ENCOUNTER — Ambulatory Visit

## 2024-01-22 ENCOUNTER — Other Ambulatory Visit: Payer: Self-pay | Admitting: Nurse Practitioner

## 2024-01-22 ENCOUNTER — Inpatient Hospital Stay

## 2024-01-22 ENCOUNTER — Inpatient Hospital Stay: Attending: Hematology

## 2024-01-22 DIAGNOSIS — K7469 Other cirrhosis of liver: Secondary | ICD-10-CM

## 2024-01-22 LAB — CMP (CANCER CENTER ONLY)
ALT: 49 U/L — ABNORMAL HIGH (ref 0–44)
AST: 65 U/L — ABNORMAL HIGH (ref 15–41)
Albumin: 3.2 g/dL — ABNORMAL LOW (ref 3.5–5.0)
Alkaline Phosphatase: 159 U/L — ABNORMAL HIGH (ref 38–126)
Anion gap: 6 (ref 5–15)
BUN: 10 mg/dL (ref 8–23)
CO2: 27 mmol/L (ref 22–32)
Calcium: 8.4 mg/dL — ABNORMAL LOW (ref 8.9–10.3)
Chloride: 101 mmol/L (ref 98–111)
Creatinine: 1.01 mg/dL (ref 0.61–1.24)
GFR, Estimated: >60 mL/min (ref >60)
Glucose, Bld: 274 mg/dL — ABNORMAL HIGH (ref 70–99)
Potassium: 4.1 mmol/L (ref 3.5–5.1)
Sodium: 134 mmol/L — ABNORMAL LOW (ref 135–145)
Total Bilirubin: 0.6 mg/dL (ref 0.0–1.2)
Total Protein: 6.8 g/dL (ref 6.5–8.1)

## 2024-01-22 LAB — CBC WITH DIFFERENTIAL (CANCER CENTER ONLY)
Abs Immature Granulocytes: 0.02 K/uL (ref 0.00–0.07)
Basophils Absolute: 0 K/uL (ref 0.0–0.1)
Basophils Relative: 0 %
Eosinophils Absolute: 0.4 K/uL (ref 0.0–0.5)
Eosinophils Relative: 8 %
HCT: 39.6 % (ref 39.0–52.0)
Hemoglobin: 14.2 g/dL (ref 13.0–17.0)
Immature Granulocytes: 0 %
Lymphocytes Relative: 14 %
Lymphs Abs: 0.7 K/uL (ref 0.7–4.0)
MCH: 32.2 pg (ref 26.0–34.0)
MCHC: 35.9 g/dL (ref 30.0–36.0)
MCV: 89.8 fL (ref 80.0–100.0)
Monocytes Absolute: 0.5 K/uL (ref 0.1–1.0)
Monocytes Relative: 11 %
Neutro Abs: 3.1 K/uL (ref 1.7–7.7)
Neutrophils Relative %: 67 %
Platelet Count: 125 K/uL — ABNORMAL LOW (ref 150–400)
RBC: 4.41 MIL/uL (ref 4.22–5.81)
RDW: 13.1 % (ref 11.5–15.5)
WBC Count: 4.8 K/uL (ref 4.0–10.5)
nRBC: 0 % (ref 0.0–0.2)

## 2024-01-22 LAB — IRON AND IRON BINDING CAPACITY (CC-WL,HP ONLY)
Iron: 248 ug/dL — ABNORMAL HIGH (ref 45–182)
Saturation Ratios: 74 % — ABNORMAL HIGH (ref 17.9–39.5)
TIBC: 337 ug/dL (ref 250–450)
UIBC: 89 ug/dL — ABNORMAL LOW (ref 117–376)

## 2024-01-22 NOTE — Patient Instructions (Signed)

## 2024-01-22 NOTE — Progress Notes (Signed)
 John [name] presents today for phlebotomy per MD orders. Phlebotomy procedure started at 1623 and ended at 1629. 499 grams removed. 16 G Phlebotomy kit used.  Patient tolerated procedure well. IV needle removed intact.  Patient declined 30 minute observation. Pt provided with diet ginger ale before leaving.

## 2024-01-23 LAB — FERRITIN: Ferritin: 160 ng/mL (ref 24–336)

## 2024-01-28 ENCOUNTER — Encounter (HOSPITAL_BASED_OUTPATIENT_CLINIC_OR_DEPARTMENT_OTHER): Payer: Self-pay | Admitting: Physical Therapy

## 2024-01-28 ENCOUNTER — Ambulatory Visit (HOSPITAL_BASED_OUTPATIENT_CLINIC_OR_DEPARTMENT_OTHER): Attending: Physician Assistant | Admitting: Physical Therapy

## 2024-01-28 DIAGNOSIS — M5459 Other low back pain: Secondary | ICD-10-CM | POA: Insufficient documentation

## 2024-01-28 DIAGNOSIS — R2689 Other abnormalities of gait and mobility: Secondary | ICD-10-CM | POA: Insufficient documentation

## 2024-01-28 DIAGNOSIS — M6281 Muscle weakness (generalized): Secondary | ICD-10-CM | POA: Insufficient documentation

## 2024-01-28 DIAGNOSIS — R252 Cramp and spasm: Secondary | ICD-10-CM | POA: Diagnosis present

## 2024-01-28 NOTE — Therapy (Signed)
 OUTPATIENT PHYSICAL THERAPY TREATMENT NOTE   Patient Name: John [name] MRN: [id number] DOB:Mar 03, 1956, 68 y.o., male Today's Date: 01/28/2024  END OF SESSION:  PT End of Session - 01/28/24 1534     Visit Number 5    Date for PT Re-Evaluation 03/05/24    Authorization Type [employer] approved 01/09/2024 - 03/05/2024    Authorization - Visit Number 4    Authorization - Number of Visits 16    Progress Note Due on Visit 10    PT Start Time 1530    PT Stop Time 1609    PT Time Calculation (min) 39 min    Activity Tolerance Patient tolerated treatment well    Behavior During Therapy WFL for tasks assessed/performed            Past Medical History:  Diagnosis Date   Anxiety    Diabetes (HCC)    Foot pain left   to have MRI tues.    Hepatitis C    High cholesterol    Past Surgical History:  Procedure Laterality Date   None     Patient Active Problem List   Diagnosis Date Noted   Hypovolemia due to dehydration 07/31/2022   Uncontrolled type 1 diabetes mellitus with hyperglycemia (HCC) 03/24/2020   Chronic motor tic 04/28/2018   Vertigo 02/25/2018   Chronic right hip pain 12/11/2016   Liver cirrhosis (HCC) 11/10/2015   Adjustment disorder with mixed anxiety and depressed mood 06/01/2015   Secondary esophageal varices without bleeding (HCC) 12/15/2014   Chronic dental infection 12/11/2014   Thrombocytopenia (HCC) 11/11/2014   Anatomical narrow angle, bilateral 12/20/2013   Tic 09/28/2013   Tremors of nervous system 07/01/2013   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   Diabetes (HCC)    High cholesterol    Anxiety    Hepatitis C    Skin pustule 12/01/2012   Cramps, extremity 10/17/2012   Vitamin D insufficiency 05/14/2012   Hematochezia 03/17/2012   Male hypogonadism 08/20/2011   Diabetic neuropathy (HCC) 08/14/2011   Onychomycosis due to dermatophyte 08/14/2011   Pancytopenia (HCC) 06/06/2011   Depression 03/19/2011    PCP: Lenon Boyer  REFERRING PROVIDER:  Donata Clapper, PA  REFERRING DIAG:  Diagnosis  M51.26 (ICD-10-CM) - Other intervertebral disc displacement, lumbar region    THERAPY DIAG:  Other low back pain  Cramp and spasm  Other abnormalities of gait and mobility  Muscle weakness (generalized)  Rationale for Evaluation and Treatment: Rehabilitation  ONSET DATE: 10/2023- Lt rib fracture   SUBJECTIVE:   SUBJECTIVE STATEMENT: Pt reports he had epidural yesterday.  Arrived by himself, without RW; still occasionally uses it.   Feeling tired, didn't sleep well last night.  Pt plans to go to music festival in 2 weeks and vacation to [place] in September.     POOL ACCESS: member of [employer]  PERTINENT HISTORY: Diabetes, vertigo, motor tic/tremors, depression, diabetic neuropathy  PAIN:  Are you having pain? Yes: NPRS scale: barely a 2/10 Pain location: Rt low back to Rt gluteals  Pain description: ache Aggravating factors: moving, bed mobility, standing with Rt=Lt (not able), sitting (not able) Relieving factors: shifting off of Rt LE to the Lt, warm bath   PRECAUTIONS: Fall  RED FLAGS: None   WEIGHT BEARING RESTRICTIONS: No  FALLS:  Has patient fallen in last 6 months? Yes. Number of falls many times-PT will address balance   LIVING ENVIRONMENT: Lives with: lives with their spouse Lives in: House/apartment Stairs: Yes: External: 3 steps; on right going up Has  following equipment at home: Single point cane, Walker - 2 wheeled, Wheelchair (manual), and Ramped entry  OCCUPATION: does not work   PLOF: Independent, Independent with household mobility with device, and Leisure: walk for exercise  PATIENT GOALS: walk without pain, reduce pain, sit longer, go to music festival in September   NEXT MD VISIT: specialist at [facility] 01/29/24  OBJECTIVE:  Note: Objective measures were completed at Evaluation unless otherwise noted.  DIAGNOSTIC FINDINGS:  Lumbar x-ray 01/08/24 FINDINGS: No acute fracture.  No  significant listhesis.  Moderate degenerative disc and facet changes at L5-S1, mild at the remaining levels.  Additional findings: Vascular calcifications. Increased stool burden.  Rt hip:  FINDINGS:  No acute fracture dislocation. 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 The adjacent soft tissues are unremarkable.    Lumbar MRI on 01/11/2024: IMPRESSION: 1. Lumbar spondylosis as outlined within the body of the report. 2. At L3-L4, there is a disc bulge. Superimposed broad-based right foraminal/extraforaminal disc protrusion (at site of posterior annular fissure). The disc protrusion results in mild right inferior neural foraminal narrowing and may contact the exiting right L3 nerve root beyond the neural foramen. Correlate for right L3 radiculopathy. Mild degenerative endplate edema and enhancement on the right. 3. No significant spinal canal or foraminal stenosis at the remaining levels. 4. Levocurvature of the lumbar spine.   PATIENT SURVEYS:  01/09/24: Modified Oswestry: 30/50=60% disability    COGNITION: Overall cognitive status: Within functional limits for tasks assessed     SENSATION: WFL   MUSCLE LENGTH: Unable to test due to irritability of symptoms   POSTURE: rounded shoulders, forward head, and weight shift left  PALPATION: Palpable tenderness over Lt lumbar paraspinals and gluteals.   LOWER EXTREMITY ROM:  Unable to test due to irritability of symptoms   LOWER EXTREMITY MMT: Pain with resisted testing in Rt gluteals. At least 4/5 in bil hips and knees    FUNCTIONAL TESTS:  01/09/24 Timed up and go (TUG): 30.72 seconds with cane   01/13/2024: Timed up and go (TUG):  24.79 sec with RW 3 minute walk:  329 ft with RW and RPE of 6/10  GAIT: Distance walked: 25 feet  Assistive device utilized: Single point cane Level of assistance: SBA Comments: instability, asymmetry with reduced time spent on Rt LE, guarded  movement                                                                                                                        TREATMENT DATE:  [facility] Adult PT Treatment:                                             Date:01/28/24  Pt seen for aquatic therapy today.  Treatment took place in water 3.5-4.75 ft in depth at the [facility] pool. Temp of water was  91.  Pt entered/exited the pool via stairs independently with bil rail. - UE on barbell- walking to deepest water; across pool -> - unsupported walking forward/ backward - side stepping with arm add/abdct - UE on wall: hip add/abdct 2 x 5;  toe/heel raises x 10; hip extension to toe touch x 10 each; relaxed squats x10 - TrA set with hollow 1/2 noodle -> full hollow noodle pull down to thighs x 10  (reported some cramping in back afterwards)  - noodle under arms behind back: cycling -> feet on ground walking backwards   01/15/24: Pt arrives for aquatic physical therapy. Treatment took place in 3.5-5.5 feet of water. Water temperature was 91 degrees F. Pt entered the pool via side stepping down stairs holding the rails with Bil UE.SABRA Seated water bench with 75% submersion Pt performed seated LE AROM exercises 10x in all planes, concurrent discussion of current status. Semi-sit arms forward and back sitting up tall 10x.  Standing heel raises bil 10x. Marching 6x, hip abd 10x  Bil: holding on wall less heavily than last session. Water walking holding yellow noodle from bench seat to other end of bench seat incase he needed to sit quickly: forward and backward4x, seated rest 1 min, then side stepping same place 2x. Pt gets a little lightheaded, requires VC to breath and definitely needs seated rest breaks. Standing against the wall at 50% depth for kickboard rows 10x.   01/13/2024: Nustep level 3 x6 min with PT present to discuss status (pt cues for slow/easy speed) Timed Up and Go with RW Seated hamstring stretch 2x20 sec bilat (with cuing for  improved technique and to not over-do the stretch) Seated hip adduction ball squeeze x10 with 5 sec hold Seated transversus abdominus contraction by pressing ball into thighs x10 with 5 sec hold 3 minute walk:  329 ft with RW and RPE of 6/10 Seated hip abduction clamshells with blue loop 2x10 (pt reported as easy resistance) Seated marching with blue loop 2x5 Seated long arc quad x10 bilat   01/10/24: Pt arrives for aquatic physical therapy. Treatment took place in 3.5-5.5 feet of water. Water temperature was 91 degrees F. Pt entered the pool via. Seated water bench with 75% submersion Pt performed seated LE AROM exercises 10x in all planes, concurrent discussion of water principles and how we will use them with exercise.Pt verbally understood. Standing heel raises bil 6x. Marching 6x Bil: holding on wall heavily. Pt's pain was increasing,he was getting agitated so we ended session early.       PATIENT EDUCATION:  Education details: [email or web address], Environmental education officer Person educated: Patient Education method: Explanation, Demonstration, and Handouts Education comprehension: verbalized understanding and returned demonstration  HOME EXERCISE PROGRAM: Access Code: [email or web address] URL: [email or web address] Date: 01/13/2024 Prepared by: Jarrell Menke  Exercises - Supine Lower Trunk Rotation  - 3 x daily - 7 x weekly - 1 sets - 3 reps - 20 hold - Hooklying Single Knee to Chest Stretch  - 3 x daily - 7 x weekly - 1 sets - 3 reps - 20 hold - Supine Hip Adduction Isometric with Ball  - 3 x daily - 7 x weekly - 1-2 sets - 10 reps - 5 hold - Seated Hamstring Stretch  - 3 x daily - 7 x weekly - 1 sets - 3 reps - 20 hold - Seated Isometric Hip Adduction with Ball  - 1 x daily - 7 x weekly - 1-2 sets - 10 reps - 5 sec hold -  Seated Transversus Abdominis Bracing  - 1 x daily - 7 x weekly - 3 sets - 10 reps - Seated Hip Abduction with Resistance  - 1 x daily - 7 x weekly - 2 sets - 10 reps - Seated  March with Resistance  - 1 x daily - 7 x weekly - 2-3 sets - 5 reps - Seated Long Arc Quad  - 1 x daily - 7 x weekly - 2 sets - 10 reps  ASSESSMENT:  CLINICAL IMPRESSION: Pt tolerated increased volume of exercises without excessive fatigue and no increase in pain.  Pt reported some cramping in lower back with resisted shoulder extension with hollow noodle; eased with change in exercise. He reported some dizziness if looking down at water; slightly improved with keeping eyes focused on wall instead.  Will continue to progress as tolerated. .   OBJECTIVE IMPAIRMENTS: Abnormal gait, decreased activity tolerance, decreased balance, decreased endurance, decreased mobility, difficulty walking, decreased strength, decreased safety awareness, increased muscle spasms, impaired flexibility, improper body mechanics, postural dysfunction, and pain.   ACTIVITY LIMITATIONS: carrying, lifting, bending, sitting, standing, squatting, sleeping, stairs, transfers, bed mobility, bathing, toileting, dressing, hygiene/grooming, and locomotion level  PARTICIPATION LIMITATIONS: meal prep, cleaning, laundry, shopping, and community activity  PERSONAL FACTORS: 1-2 comorbidities: fall with rib fracture, lumbar DDD are also affecting patient's functional outcome.   REHAB POTENTIAL: Good  CLINICAL DECISION MAKING: Stable/uncomplicated  EVALUATION COMPLEXITY: Low   GOALS: Goals reviewed with patient? Yes  SHORT TERM GOALS: Target date: 02/06/2024   Be independent in initial HEP Baseline: Goal status: Ongoing  2.  Sit with Rt=Lt weight bearing x 5 minutes due to reduced Rt LE pain Baseline:  Goal status: INITIAL  3.  Ambulate with rolling walker x 5-7 minutes with neutral posture/weightbearing for household function  Baseline:  Goal status: Ongoing  4.  Report > or = to 20% reduction in Rt LE pain to improve function with ADLs and self-care Baseline:  Goal status: INITIAL    LONG TERM GOALS: Target  date: 03/05/2024    Be independent in advanced HEP Baseline:  Goal status: INITIAL  2.  Improve Modified Oswestry to < or = to 48% disability  Baseline: 60% disability (30/50) Goal status: INITIAL  3.  Improve TUG to < or = to 16 seconds to reduce falls risk  Baseline: 30 seconds  Goal status: Ongoing  4.  Sit with Rt=Lt weight bearing x 20 minutes due to improve riding in the car Baseline:  Goal status: INITIAL  5.  Report > or = to 40% reduction in Rt LE pain with self-care and ADLs  Baseline:  Goal status: INITIAL  6.  Stand with Rt=Lt weightbearing x 10 min for ADLs due to reduced pain Baseline:  Goal status: INITIAL   PLAN:  PT FREQUENCY: 2x/week  PT DURATION: 8 weeks  PLANNED INTERVENTIONS: 97110-Therapeutic exercises, 97530- Therapeutic activity, 97112- Neuromuscular re-education, 97535- Self Care, [id number]- Manual therapy, [phone]- Gait training, [phone]- Canalith repositioning, [id number]- Aquatic Therapy, [phone]- Electrical stimulation (unattended), [phone]- Electrical stimulation (manual), [id number]- Vasopneumatic device, [id number]- Ultrasound, [id number]- Traction (mechanical), [id number] (1-2 muscles), [id number] (3+ muscles)- Dry Needling, Patient/Family education, Balance training, Taping, Joint mobilization, Joint manipulation, Spinal manipulation, Spinal mobilization, Vestibular training, and Cryotherapy  PLAN FOR NEXT SESSION: gait with walker and focus on symmetry, gentle mobility and advance as able, aquatics and land  [place], [place] 01/28/24 6:13 PM [facility] [address] [place], [place], [address] Phone: [phone]  Fax:  [phone]

## 2024-01-30 ENCOUNTER — Ambulatory Visit

## 2024-01-30 DIAGNOSIS — R293 Abnormal posture: Secondary | ICD-10-CM

## 2024-01-30 DIAGNOSIS — M6281 Muscle weakness (generalized): Secondary | ICD-10-CM

## 2024-01-30 DIAGNOSIS — M5459 Other low back pain: Secondary | ICD-10-CM | POA: Diagnosis not present

## 2024-01-30 DIAGNOSIS — R262 Difficulty in walking, not elsewhere classified: Secondary | ICD-10-CM

## 2024-01-30 DIAGNOSIS — R252 Cramp and spasm: Secondary | ICD-10-CM

## 2024-01-30 NOTE — Therapy (Unsigned)
 OUTPATIENT PHYSICAL THERAPY TREATMENT NOTE   Patient Name: John [name] MRN: [id number] DOB:Aug 12, 1955, 68 y.o., male Today's Date: 01/31/2024  END OF SESSION:  PT End of Session - 01/30/24 1241     Visit Number 6    Number of Visits 16    Date for PT Re-Evaluation 03/05/24    Authorization Type [employer]    Authorization Time Period Approved 16 visits 01/09/2024 - 03/05/2024    Authorization - Visit Number 6    Authorization - Number of Visits 16    Progress Note Due on Visit 10    PT Start Time 1230    PT Stop Time 1311    PT Time Calculation (min) 41 min    Activity Tolerance Patient tolerated treatment well    Behavior During Therapy WFL for tasks assessed/performed            Past Medical History:  Diagnosis Date   Anxiety    Diabetes (HCC)    Foot pain left   to have MRI tues.    Hepatitis C    High cholesterol    Past Surgical History:  Procedure Laterality Date   None     Patient Active Problem List   Diagnosis Date Noted   Hypovolemia due to dehydration 07/31/2022   Uncontrolled type 1 diabetes mellitus with hyperglycemia (HCC) 03/24/2020   Chronic motor tic 04/28/2018   Vertigo 02/25/2018   Chronic right hip pain 12/11/2016   Liver cirrhosis (HCC) 11/10/2015   Adjustment disorder with mixed anxiety and depressed mood 06/01/2015   Secondary esophageal varices without bleeding (HCC) 12/15/2014   Chronic dental infection 12/11/2014   Thrombocytopenia (HCC) 11/11/2014   Anatomical narrow angle, bilateral 12/20/2013   Tic 09/28/2013   Tremors of nervous system 07/01/2013   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   Diabetes (HCC)    High cholesterol    Anxiety    Hepatitis C    Skin pustule 12/01/2012   Cramps, extremity 10/17/2012   Vitamin D insufficiency 05/14/2012   Hematochezia 03/17/2012   Male hypogonadism 08/20/2011   Diabetic neuropathy (HCC) 08/14/2011   Onychomycosis due to dermatophyte 08/14/2011   Pancytopenia (HCC) 06/06/2011   Depression  03/19/2011    PCP: Lenon Boyer  REFERRING PROVIDER: Donata Clapper, PA  REFERRING DIAG:  Diagnosis  M51.26 (ICD-10-CM) - Other intervertebral disc displacement, lumbar region    THERAPY DIAG:  Other low back pain  Cramp and spasm  Difficulty in walking, not elsewhere classified  Muscle weakness (generalized)  Abnormal posture  Rationale for Evaluation and Treatment: Rehabilitation  ONSET DATE: 10/2023- Lt rib fracture   SUBJECTIVE:   SUBJECTIVE STATEMENT: Patient reports he is continuing to do well since ESI.  He is still being careful but no need for walker.  I was able to mow my lawn yesterday (riding mower)    POOL ACCESS: member of [employer]  PERTINENT HISTORY: Diabetes, vertigo, motor tic/tremors, depression, diabetic neuropathy  PAIN:  Are you having pain? Yes: NPRS scale: barely a 2/10 Pain location: Rt low back to Rt gluteals  Pain description: ache Aggravating factors: moving, bed mobility, standing with Rt=Lt (not able), sitting (not able) Relieving factors: shifting off of Rt LE to the Lt, warm bath   PRECAUTIONS: Fall  RED FLAGS: None   WEIGHT BEARING RESTRICTIONS: No  FALLS:  Has patient fallen in last 6 months? Yes. Number of falls many times-PT will address balance   LIVING ENVIRONMENT: Lives with: lives with their spouse Lives in: House/apartment Stairs: Yes: External:  3 steps; on right going up Has following equipment at home: Single point cane, Walker - 2 wheeled, Wheelchair (manual), and Ramped entry  OCCUPATION: does not work   PLOF: Independent, Independent with household mobility with device, and Leisure: walk for exercise  PATIENT GOALS: walk without pain, reduce pain, sit longer, go to music festival in September   NEXT MD VISIT: specialist at [facility] 01/29/24  OBJECTIVE:  Note: Objective measures were completed at Evaluation unless otherwise noted.  DIAGNOSTIC FINDINGS:  Lumbar x-ray 01/08/24 FINDINGS: No acute  fracture.  No significant listhesis.  Moderate degenerative disc and facet changes at L5-S1, mild at the remaining levels.  Additional findings: Vascular calcifications. Increased stool burden.  Rt hip:  FINDINGS:  No acute fracture dislocation. 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 The adjacent soft tissues are unremarkable.    Lumbar MRI on 01/11/2024: IMPRESSION: 1. Lumbar spondylosis as outlined within the body of the report. 2. At L3-L4, there is a disc bulge. Superimposed broad-based right foraminal/extraforaminal disc protrusion (at site of posterior annular fissure). The disc protrusion results in mild right inferior neural foraminal narrowing and may contact the exiting right L3 nerve root beyond the neural foramen. Correlate for right L3 radiculopathy. Mild degenerative endplate edema and enhancement on the right. 3. No significant spinal canal or foraminal stenosis at the remaining levels. 4. Levocurvature of the lumbar spine.   PATIENT SURVEYS:  01/09/24: Modified Oswestry: 30/50=60% disability    COGNITION: Overall cognitive status: Within functional limits for tasks assessed     SENSATION: WFL   MUSCLE LENGTH: Unable to test due to irritability of symptoms   POSTURE: rounded shoulders, forward head, and weight shift left  PALPATION: Palpable tenderness over Lt lumbar paraspinals and gluteals.   LOWER EXTREMITY ROM:  Unable to test due to irritability of symptoms   LOWER EXTREMITY MMT: Pain with resisted testing in Rt gluteals. At least 4/5 in bil hips and knees    FUNCTIONAL TESTS:  01/09/24 Timed up and go (TUG): 30.72 seconds with cane   01/13/2024: Timed up and go (TUG):  24.79 sec with RW 3 minute walk:  329 ft with RW and RPE of 6/10  GAIT: Distance walked: 25 feet  Assistive device utilized: Single point cane Level of assistance: SBA Comments: instability, asymmetry with reduced time spent  on Rt LE, guarded movement                                                                                                                        TREATMENT DATE:  01/30/24 Nustep x 5 min level 2 Standing hamstring stretch 2 x 30 sec (heavy vc's for executing gentle stretches with proper technique) Attempted quad/hip flexor stretch but patient did not feel much stretch at all on either leg so we discontinued this Seated piriformis stretch 2 x 30 sec Seated mini sit ups with 5 lb dumbbell 2 x 10 Seated modified Russian twist with 5 lb  dumbbell  x 20 Ice to lumbar spine x 10 min in hook lying   [facility] Adult PT Treatment:                                             Date:01/28/24  Pt seen for aquatic therapy today.  Treatment took place in water 3.5-4.75 ft in depth at the [facility] pool. Temp of water was 91.  Pt entered/exited the pool via stairs independently with bil rail. - UE on barbell- walking to deepest water; across pool -> - unsupported walking forward/ backward - side stepping with arm add/abdct - UE on wall: hip add/abdct 2 x 5;  toe/heel raises x 10; hip extension to toe touch x 10 each; relaxed squats x10 - TrA set with hollow 1/2 noodle -> full hollow noodle pull down to thighs x 10  (reported some cramping in back afterwards)  - noodle under arms behind back: cycling -> feet on ground walking backwards   01/15/24: Pt arrives for aquatic physical therapy. Treatment took place in 3.5-5.5 feet of water. Water temperature was 91 degrees F. Pt entered the pool via side stepping down stairs holding the rails with Bil UE.SABRA Seated water bench with 75% submersion Pt performed seated LE AROM exercises 10x in all planes, concurrent discussion of current status. Semi-sit arms forward and back sitting up tall 10x.  Standing heel raises bil 10x. Marching 6x, hip abd 10x  Bil: holding on wall less heavily than last session. Water walking holding yellow noodle from bench seat to other end of  bench seat incase he needed to sit quickly: forward and backward4x, seated rest 1 min, then side stepping same place 2x. Pt gets a little lightheaded, requires VC to breath and definitely needs seated rest breaks. Standing against the wall at 50% depth for kickboard rows 10x.    PATIENT EDUCATION:  Education details: [email or web address], Environmental education officer Person educated: Patient Education method: Explanation, Demonstration, and Handouts Education comprehension: verbalized understanding and returned demonstration  HOME EXERCISE PROGRAM: Access Code: [email or web address] URL: [email or web address] Date: 01/13/2024 Prepared by: Jarrell Menke  Exercises - Supine Lower Trunk Rotation  - 3 x daily - 7 x weekly - 1 sets - 3 reps - 20 hold - Hooklying Single Knee to Chest Stretch  - 3 x daily - 7 x weekly - 1 sets - 3 reps - 20 hold - Supine Hip Adduction Isometric with Ball  - 3 x daily - 7 x weekly - 1-2 sets - 10 reps - 5 hold - Seated Hamstring Stretch  - 3 x daily - 7 x weekly - 1 sets - 3 reps - 20 hold - Seated Isometric Hip Adduction with Ball  - 1 x daily - 7 x weekly - 1-2 sets - 10 reps - 5 sec hold - Seated Transversus Abdominis Bracing  - 1 x daily - 7 x weekly - 3 sets - 10 reps - Seated Hip Abduction with Resistance  - 1 x daily - 7 x weekly - 2 sets - 10 reps - Seated March with Resistance  - 1 x daily - 7 x weekly - 2-3 sets - 5 reps - Seated Long Arc Quad  - 1 x daily - 7 x weekly - 2 sets - 10 reps  ASSESSMENT:  CLINICAL IMPRESSION: Al seems to be progressing well since ESI.  He continues to  move about with guarded posture but is not having to use the walker.  He completed all tasks today with minimal to no complaint. He needed increased instruction due to need to be cautious with stretching to avoid exacerbation post injection. We ended treatment with ice in hook lying.  He should continue to do well.  He would benefit from continuing skilled PT to meet goals stated below.     OBJECTIVE  IMPAIRMENTS: Abnormal gait, decreased activity tolerance, decreased balance, decreased endurance, decreased mobility, difficulty walking, decreased strength, decreased safety awareness, increased muscle spasms, impaired flexibility, improper body mechanics, postural dysfunction, and pain.   ACTIVITY LIMITATIONS: carrying, lifting, bending, sitting, standing, squatting, sleeping, stairs, transfers, bed mobility, bathing, toileting, dressing, hygiene/grooming, and locomotion level  PARTICIPATION LIMITATIONS: meal prep, cleaning, laundry, shopping, and community activity  PERSONAL FACTORS: 1-2 comorbidities: fall with rib fracture, lumbar DDD are also affecting patient's functional outcome.   REHAB POTENTIAL: Good  CLINICAL DECISION MAKING: Stable/uncomplicated  EVALUATION COMPLEXITY: Low   GOALS: Goals reviewed with patient? Yes  SHORT TERM GOALS: Target date: 02/06/2024   Be independent in initial HEP Baseline: Goal status: Ongoing  2.  Sit with Rt=Lt weight bearing x 5 minutes due to reduced Rt LE pain Baseline:  Goal status: INITIAL  3.  Ambulate with rolling walker x 5-7 minutes with neutral posture/weightbearing for household function  Baseline:  Goal status: Ongoing  4.  Report > or = to 20% reduction in Rt LE pain to improve function with ADLs and self-care Baseline:  Goal status: INITIAL    LONG TERM GOALS: Target date: 03/05/2024    Be independent in advanced HEP Baseline:  Goal status: INITIAL  2.  Improve Modified Oswestry to < or = to 48% disability  Baseline: 60% disability (30/50) Goal status: INITIAL  3.  Improve TUG to < or = to 16 seconds to reduce falls risk  Baseline: 30 seconds  Goal status: Ongoing  4.  Sit with Rt=Lt weight bearing x 20 minutes due to improve riding in the car Baseline:  Goal status: INITIAL  5.  Report > or = to 40% reduction in Rt LE pain with self-care and ADLs  Baseline:  Goal status: INITIAL  6.  Stand with Rt=Lt  weightbearing x 10 min for ADLs due to reduced pain Baseline:  Goal status: INITIAL   PLAN:  PT FREQUENCY: 2x/week  PT DURATION: 8 weeks  PLANNED INTERVENTIONS: 97110-Therapeutic exercises, 97530- Therapeutic activity, [id number]- Neuromuscular re-education, 97535- Self Care, [id number]- Manual therapy, [id number]- Gait training, [phone]- Canalith repositioning, [id number]- Aquatic Therapy, [phone]- Electrical stimulation (unattended), [phone]- Electrical stimulation (manual), [id number]- Vasopneumatic device, [id number]- Ultrasound, [id number]- Traction (mechanical), [id number] (1-2 muscles), [id number] (3+ muscles)- Dry Needling, Patient/Family education, Balance training, Taping, Joint mobilization, Joint manipulation, Spinal manipulation, Spinal mobilization, Vestibular training, and Cryotherapy  PLAN FOR NEXT SESSION: Focus on symmetry, gentle mobility and advance as able, aquatics and land, progress core strengthening if symptoms still well controlled.   Delon B. Dejia Ebron, PT 01/31/24 7:16 AM [facility] [address], [address] [place], [place] [address] Phone # [phone] Fax [phone]

## 2024-02-04 ENCOUNTER — Ambulatory Visit

## 2024-02-07 ENCOUNTER — Ambulatory Visit: Admitting: Physical Therapy

## 2024-02-11 ENCOUNTER — Ambulatory Visit: Attending: Physician Assistant

## 2024-02-11 DIAGNOSIS — R262 Difficulty in walking, not elsewhere classified: Secondary | ICD-10-CM | POA: Insufficient documentation

## 2024-02-11 DIAGNOSIS — R252 Cramp and spasm: Secondary | ICD-10-CM | POA: Insufficient documentation

## 2024-02-11 DIAGNOSIS — M5459 Other low back pain: Secondary | ICD-10-CM | POA: Insufficient documentation

## 2024-02-11 DIAGNOSIS — R293 Abnormal posture: Secondary | ICD-10-CM | POA: Insufficient documentation

## 2024-02-11 DIAGNOSIS — R2689 Other abnormalities of gait and mobility: Secondary | ICD-10-CM | POA: Insufficient documentation

## 2024-02-11 DIAGNOSIS — M6281 Muscle weakness (generalized): Secondary | ICD-10-CM | POA: Insufficient documentation

## 2024-02-12 ENCOUNTER — Encounter: Payer: Self-pay | Admitting: Physical Therapy

## 2024-02-12 ENCOUNTER — Ambulatory Visit: Admitting: Physical Therapy

## 2024-02-12 DIAGNOSIS — R262 Difficulty in walking, not elsewhere classified: Secondary | ICD-10-CM

## 2024-02-12 DIAGNOSIS — M5459 Other low back pain: Secondary | ICD-10-CM | POA: Diagnosis present

## 2024-02-12 DIAGNOSIS — M6281 Muscle weakness (generalized): Secondary | ICD-10-CM | POA: Diagnosis present

## 2024-02-12 DIAGNOSIS — R252 Cramp and spasm: Secondary | ICD-10-CM

## 2024-02-12 DIAGNOSIS — R2689 Other abnormalities of gait and mobility: Secondary | ICD-10-CM

## 2024-02-12 DIAGNOSIS — R293 Abnormal posture: Secondary | ICD-10-CM

## 2024-02-12 NOTE — Therapy (Signed)
 OUTPATIENT PHYSICAL THERAPY TREATMENT NOTE   Patient Name: John [name] MRN: [id number] DOB:April 16, 1956, 68 y.o., male Today's Date: 02/12/2024  END OF SESSION:  PT End of Session - 02/12/24 1140     Visit Number 7    Number of Visits 16    Date for PT Re-Evaluation 03/05/24    Authorization Type [employer]    Authorization Time Period Approved 16 visits 01/09/2024 - 03/05/2024    Authorization - Visit Number 7    Authorization - Number of Visits 16    Progress Note Due on Visit 10    PT Start Time 1140    PT Stop Time 1223    PT Time Calculation (min) 43 min    Activity Tolerance Patient tolerated treatment well    Behavior During Therapy WFL for tasks assessed/performed             Past Medical History:  Diagnosis Date   Anxiety    Diabetes (HCC)    Foot pain left   to have MRI tues.    Hepatitis C    High cholesterol    Past Surgical History:  Procedure Laterality Date   None     Patient Active Problem List   Diagnosis Date Noted   Hypovolemia due to dehydration 07/31/2022   Uncontrolled type 1 diabetes mellitus with hyperglycemia (HCC) 03/24/2020   Chronic motor tic 04/28/2018   Vertigo 02/25/2018   Chronic right hip pain 12/11/2016   Liver cirrhosis (HCC) 11/10/2015   Adjustment disorder with mixed anxiety and depressed mood 06/01/2015   Secondary esophageal varices without bleeding (HCC) 12/15/2014   Chronic dental infection 12/11/2014   Thrombocytopenia (HCC) 11/11/2014   Anatomical narrow angle, bilateral 12/20/2013   Tic 09/28/2013   Tremors of nervous system 07/01/2013   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   Diabetes (HCC)    High cholesterol    Anxiety    Hepatitis C    Skin pustule 12/01/2012   Cramps, extremity 10/17/2012   Vitamin D insufficiency 05/14/2012   Hematochezia 03/17/2012   Male hypogonadism 08/20/2011   Diabetic neuropathy (HCC) 08/14/2011   Onychomycosis due to dermatophyte 08/14/2011   Pancytopenia (HCC) 06/06/2011   Depression  03/19/2011    PCP: Lenon Boyer  REFERRING PROVIDER: Donata Clapper, PA  REFERRING DIAG:  Diagnosis  M51.26 (ICD-10-CM) - Other intervertebral disc displacement, lumbar region    THERAPY DIAG:  Other low back pain  Cramp and spasm  Difficulty in walking, not elsewhere classified  Muscle weakness (generalized)  Abnormal posture  Other abnormalities of gait and mobility  Rationale for Evaluation and Treatment: Rehabilitation  ONSET DATE: 10/2023- Lt rib fracture   SUBJECTIVE:   SUBJECTIVE STATEMENT: I had to have a repeat CT yesterday with contrast. My right quad has hurt pretty good since that procedure.    POOL ACCESS: member of [employer]  PERTINENT HISTORY: Diabetes, vertigo, motor tic/tremors, depression, diabetic neuropathy  PAIN:  Are you having pain? Yes: NPRS scale: barely a 3-6/10 Pain location: Rt quad when weight bearing Pain description: ache Aggravating factors: moving, bed mobility, standing with Rt=Lt (not able), sitting (not able) Relieving factors: shifting off of Rt LE to the Lt, warm bath   PRECAUTIONS: Fall  RED FLAGS: None   WEIGHT BEARING RESTRICTIONS: No  FALLS:  Has patient fallen in last 6 months? Yes. Number of falls many times-PT will address balance   LIVING ENVIRONMENT: Lives with: lives with their spouse Lives in: House/apartment Stairs: Yes: External: 3 steps; on right going up  Has following equipment at home: Single point cane, Walker - 2 wheeled, Wheelchair (manual), and Ramped entry  OCCUPATION: does not work   PLOF: Independent, Independent with household mobility with device, and Leisure: walk for exercise  PATIENT GOALS: walk without pain, reduce pain, sit longer, go to music festival in September   NEXT MD VISIT: specialist at [facility] 01/29/24  OBJECTIVE:  Note: Objective measures were completed at Evaluation unless otherwise noted.  DIAGNOSTIC FINDINGS:  Lumbar x-ray 01/08/24 FINDINGS: No acute  fracture.  No significant listhesis.  Moderate degenerative disc and facet changes at L5-S1, mild at the remaining levels.  Additional findings: Vascular calcifications. Increased stool burden.  Rt hip:  FINDINGS:  No acute fracture dislocation. 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 The adjacent soft tissues are unremarkable.    Lumbar MRI on 01/11/2024: IMPRESSION: 1. Lumbar spondylosis as outlined within the body of the report. 2. At L3-L4, there is a disc bulge. Superimposed broad-based right foraminal/extraforaminal disc protrusion (at site of posterior annular fissure). The disc protrusion results in mild right inferior neural foraminal narrowing and may contact the exiting right L3 nerve root beyond the neural foramen. Correlate for right L3 radiculopathy. Mild degenerative endplate edema and enhancement on the right. 3. No significant spinal canal or foraminal stenosis at the remaining levels. 4. Levocurvature of the lumbar spine.   PATIENT SURVEYS:  01/09/24: Modified Oswestry: 30/50=60% disability    COGNITION: Overall cognitive status: Within functional limits for tasks assessed     SENSATION: WFL   MUSCLE LENGTH: Unable to test due to irritability of symptoms   POSTURE: rounded shoulders, forward head, and weight shift left  PALPATION: Palpable tenderness over Lt lumbar paraspinals and gluteals.   LOWER EXTREMITY ROM:  Unable to test due to irritability of symptoms   LOWER EXTREMITY MMT: Pain with resisted testing in Rt gluteals. At least 4/5 in bil hips and knees    FUNCTIONAL TESTS:  01/09/24 Timed up and go (TUG): 30.72 seconds with cane   01/13/2024: Timed up and go (TUG):  24.79 sec with RW 3 minute walk:  329 ft with RW and RPE of 6/10  GAIT: Distance walked: 25 feet  Assistive device utilized: Single point cane Level of assistance: SBA Comments: instability, asymmetry with reduced time spent  on Rt LE, guarded movement                                                                                                                        TREATMENT DATE:   02/12/24:Pt seen for aquatic therapy today.  Treatment took place in water 3.5-4.75 ft in depth at the [facility] pool. Temp of water was 91.  Pt entered/exited the pool via stairs independently with bil rail. - UE on yellow noodle walking to deepest water; across pool ->6 lengths ea direction. - side stepping with yellow noodle 6 lengths - UE on wall: hip add/abdct 10x;  toe/heel raises x 15; hip  extension to toe touch x 10 each; relaxed squats x10 - TrA set with hollow 1/2 noodle semi sitting on bench 2x10  - RTLE forward step ups 2x6 holding Bil rails, then 2x6 Rt LE lateral steps with 1 rail for support -Standing lumbar stretch (childs pose)    01/30/24 Nustep x 5 min level 2 Standing hamstring stretch 2 x 30 sec (heavy vc's for executing gentle stretches with proper technique) Attempted quad/hip flexor stretch but patient did not feel much stretch at all on either leg so we discontinued this Seated piriformis stretch 2 x 30 sec Seated mini sit ups with 5 lb dumbbell 2 x 10 Seated modified Russian twist with 5 lb dumbbell  x 20 Ice to lumbar spine x 10 min in hook lying   [facility] Adult PT Treatment:                                             Date:01/28/24  Pt seen for aquatic therapy today.  Treatment took place in water 3.5-4.75 ft in depth at the [facility] pool. Temp of water was 91.  Pt entered/exited the pool via stairs independently with bil rail. - UE on barbell- walking to deepest water; across pool -> - unsupported walking forward/ backward - side stepping with arm add/abdct - UE on wall: hip add/abdct 2 x 5;  toe/heel raises x 10; hip extension to toe touch x 10 each; relaxed squats x10 - TrA set with hollow 1/2 noodle -> full hollow noodle pull down to thighs x 10  (reported some cramping in back  afterwards)  - noodle under arms behind back: cycling -> feet on ground walking backwards   PATIENT EDUCATION:  Education details: [email or web address], Environmental education officer Person educated: Patient Education method: Explanation, Demonstration, and Handouts Education comprehension: verbalized understanding and returned demonstration  HOME EXERCISE PROGRAM: Access Code: [email or web address] URL: [email or web address] Date: 01/13/2024 Prepared by: Jarrell Menke  Exercises - Supine Lower Trunk Rotation  - 3 x daily - 7 x weekly - 1 sets - 3 reps - 20 hold - Hooklying Single Knee to Chest Stretch  - 3 x daily - 7 x weekly - 1 sets - 3 reps - 20 hold - Supine Hip Adduction Isometric with Ball  - 3 x daily - 7 x weekly - 1-2 sets - 10 reps - 5 hold - Seated Hamstring Stretch  - 3 x daily - 7 x weekly - 1 sets - 3 reps - 20 hold - Seated Isometric Hip Adduction with Ball  - 1 x daily - 7 x weekly - 1-2 sets - 10 reps - 5 sec hold - Seated Transversus Abdominis Bracing  - 1 x daily - 7 x weekly - 3 sets - 10 reps - Seated Hip Abduction with Resistance  - 1 x daily - 7 x weekly - 2 sets - 10 reps - Seated March with Resistance  - 1 x daily - 7 x weekly - 2-3 sets - 5 reps - Seated Long Arc Quad  - 1 x daily - 7 x weekly - 2 sets - 10 reps  ASSESSMENT:  CLINICAL IMPRESSION: Al presents to aquatic PT with complaints of a varying Rt quad pain that began after having a repeat CT yesterday. He says he feels like his knee wants to give out sometimes. Quality of movement while in the water certainly improved:  started unstable (Rt knee/hip) but became more stable as he performed the exercises.    OBJECTIVE IMPAIRMENTS: Abnormal gait, decreased activity tolerance, decreased balance, decreased endurance, decreased mobility, difficulty walking, decreased strength, decreased safety awareness, increased muscle spasms, impaired flexibility, improper body mechanics, postural dysfunction, and pain.   ACTIVITY LIMITATIONS:  carrying, lifting, bending, sitting, standing, squatting, sleeping, stairs, transfers, bed mobility, bathing, toileting, dressing, hygiene/grooming, and locomotion level  PARTICIPATION LIMITATIONS: meal prep, cleaning, laundry, shopping, and community activity  PERSONAL FACTORS: 1-2 comorbidities: fall with rib fracture, lumbar DDD are also affecting patient's functional outcome.   REHAB POTENTIAL: Good  CLINICAL DECISION MAKING: Stable/uncomplicated  EVALUATION COMPLEXITY: Low   GOALS: Goals reviewed with patient? Yes  SHORT TERM GOALS: Target date: 02/06/2024   Be independent in initial HEP Baseline: Goal status: Ongoing  2.  Sit with Rt=Lt weight bearing x 5 minutes due to reduced Rt LE pain Baseline:  Goal status: INITIAL  3.  Ambulate with rolling walker x 5-7 minutes with neutral posture/weightbearing for household function  Baseline:  Goal status: Ongoing  4.  Report > or = to 20% reduction in Rt LE pain to improve function with ADLs and self-care Baseline:  Goal status: INITIAL    LONG TERM GOALS: Target date: 03/05/2024    Be independent in advanced HEP Baseline:  Goal status: INITIAL  2.  Improve Modified Oswestry to < or = to 48% disability  Baseline: 60% disability (30/50) Goal status: INITIAL  3.  Improve TUG to < or = to 16 seconds to reduce falls risk  Baseline: 30 seconds  Goal status: Ongoing  4.  Sit with Rt=Lt weight bearing x 20 minutes due to improve riding in the car Baseline:  Goal status: INITIAL  5.  Report > or = to 40% reduction in Rt LE pain with self-care and ADLs  Baseline:  Goal status: INITIAL  6.  Stand with Rt=Lt weightbearing x 10 min for ADLs due to reduced pain Baseline:  Goal status: INITIAL   PLAN:  PT FREQUENCY: 2x/week  PT DURATION: 8 weeks  PLANNED INTERVENTIONS: 97110-Therapeutic exercises, 97530- Therapeutic activity, [id number]- Neuromuscular re-education, 97535- Self Care, [id number]- Manual therapy, [id number]- Gait  training, [phone]- Canalith repositioning, [id number]- Aquatic Therapy, [phone]- Electrical stimulation (unattended), [phone]- Electrical stimulation (manual), [id number]- Vasopneumatic device, [id number]- Ultrasound, [id number]- Traction (mechanical), [id number] (1-2 muscles), [id number] (3+ muscles)- Dry Needling, Patient/Family education, Balance training, Taping, Joint mobilization, Joint manipulation, Spinal manipulation, Spinal mobilization, Vestibular training, and Cryotherapy  PLAN FOR NEXT SESSION:Assess response to session, check in on quad pain. Looks like pt does not have any further appts scheduled.  Delon Darner, PTA 02/12/24 12:24 PM   [facility] [address] [place], [place] [address] Phone # [phone] Fax [phone]

## 2024-02-13 ENCOUNTER — Other Ambulatory Visit: Payer: Self-pay

## 2024-02-13 ENCOUNTER — Emergency Department (HOSPITAL_COMMUNITY)
Admission: EM | Admit: 2024-02-13 | Discharge: 2024-02-13 | Disposition: A | Discharge status: Home / Self Care | Attending: Emergency Medicine | Admitting: Emergency Medicine

## 2024-02-13 ENCOUNTER — Encounter (HOSPITAL_COMMUNITY): Payer: Self-pay

## 2024-02-13 ENCOUNTER — Emergency Department (HOSPITAL_COMMUNITY)

## 2024-02-13 DIAGNOSIS — M79604 Pain in right leg: Secondary | ICD-10-CM | POA: Insufficient documentation

## 2024-02-13 DIAGNOSIS — Z794 Long term (current) use of insulin: Secondary | ICD-10-CM | POA: Diagnosis not present

## 2024-02-13 DIAGNOSIS — M79605 Pain in left leg: Secondary | ICD-10-CM | POA: Insufficient documentation

## 2024-02-13 DIAGNOSIS — M545 Low back pain, unspecified: Secondary | ICD-10-CM | POA: Insufficient documentation

## 2024-02-13 LAB — CBC WITH DIFFERENTIAL/PLATELET
Abs Immature Granulocytes: 0.01 K/uL (ref 0.00–0.07)
Basophils Absolute: 0 K/uL (ref 0.0–0.1)
Basophils Relative: 1 %
Eosinophils Absolute: 0.2 K/uL (ref 0.0–0.5)
Eosinophils Relative: 5 %
HCT: 39.7 % (ref 39.0–52.0)
Hemoglobin: 13.6 g/dL (ref 13.0–17.0)
Immature Granulocytes: 0 %
Lymphocytes Relative: 16 %
Lymphs Abs: 0.6 K/uL — ABNORMAL LOW (ref 0.7–4.0)
MCH: 31.9 pg (ref 26.0–34.0)
MCHC: 34.3 g/dL (ref 30.0–36.0)
MCV: 93 fL (ref 80.0–100.0)
Monocytes Absolute: 0.4 K/uL (ref 0.1–1.0)
Monocytes Relative: 10 %
Neutro Abs: 2.8 K/uL (ref 1.7–7.7)
Neutrophils Relative %: 68 %
Platelets: 135 K/uL — ABNORMAL LOW (ref 150–400)
RBC: 4.27 MIL/uL (ref 4.22–5.81)
RDW: 14.2 % (ref 11.5–15.5)
WBC: 4.1 K/uL (ref 4.0–10.5)
nRBC: 0 % (ref 0.0–0.2)

## 2024-02-13 LAB — COMPREHENSIVE METABOLIC PANEL WITH GFR
ALT: 39 U/L (ref 0–44)
AST: 74 U/L — ABNORMAL HIGH (ref 15–41)
Albumin: 2.6 g/dL — ABNORMAL LOW (ref 3.5–5.0)
Alkaline Phosphatase: 142 U/L — ABNORMAL HIGH (ref 38–126)
Anion gap: 12 (ref 5–15)
BUN: 9 mg/dL (ref 8–23)
CO2: 21 mmol/L — ABNORMAL LOW (ref 22–32)
Calcium: 8.4 mg/dL — ABNORMAL LOW (ref 8.9–10.3)
Chloride: 102 mmol/L (ref 98–111)
Creatinine, Ser: 0.94 mg/dL (ref 0.61–1.24)
GFR, Estimated: >60 mL/min (ref >60)
Glucose, Bld: 319 mg/dL — ABNORMAL HIGH (ref 70–99)
Potassium: 4 mmol/L (ref 3.5–5.1)
Sodium: 135 mmol/L (ref 135–145)
Total Bilirubin: 1.2 mg/dL (ref 0.0–1.2)
Total Protein: 6.6 g/dL (ref 6.5–8.1)

## 2024-02-13 LAB — MAGNESIUM: Magnesium: 1.7 mg/dL (ref 1.7–2.4)

## 2024-02-13 LAB — I-STAT CHEM 8, ED
BUN: 10 mg/dL (ref 8–23)
Calcium, Ion: 1.12 mmol/L — ABNORMAL LOW (ref 1.15–1.40)
Chloride: 101 mmol/L (ref 98–111)
Creatinine, Ser: 0.9 mg/dL (ref 0.61–1.24)
Glucose, Bld: 322 mg/dL — ABNORMAL HIGH (ref 70–99)
HCT: 41 % (ref 39.0–52.0)
Hemoglobin: 13.9 g/dL (ref 13.0–17.0)
Potassium: 4.1 mmol/L (ref 3.5–5.1)
Sodium: 135 mmol/L (ref 135–145)
TCO2: 19 mmol/L — ABNORMAL LOW (ref 22–32)

## 2024-02-13 LAB — C-REACTIVE PROTEIN: CRP: <0.5 mg/dL (ref <1.0)

## 2024-02-13 LAB — SEDIMENTATION RATE: Sed Rate: 19 mm/h — ABNORMAL HIGH (ref 0–16)

## 2024-02-13 LAB — CK: Total CK: 448 U/L — ABNORMAL HIGH (ref 49–397)

## 2024-02-13 MED ORDER — METHYLPREDNISOLONE 4 MG PO TBPK: ORAL_TABLET | ORAL | 0 refills | Status: AC

## 2024-02-13 MED ORDER — DIAZEPAM 5 MG PO TABS
5.0000 mg | ORAL_TABLET | Freq: Once | ORAL | Status: AC
  Administered 2024-02-13: 5 mg via ORAL
  Filled 2024-02-13: qty 1

## 2024-02-13 MED ORDER — KETOROLAC TROMETHAMINE 15 MG/ML IJ SOLN
15.0000 mg | Freq: Once | INTRAMUSCULAR | Status: AC
  Administered 2024-02-13: 15 mg via INTRAVENOUS
  Filled 2024-02-13: qty 1

## 2024-02-13 MED ORDER — DICLOFENAC SODIUM 1 % EX GEL: 4.0000 g | Freq: Four times a day (QID) | CUTANEOUS | 0 refills | Status: AC

## 2024-02-13 MED ORDER — ACETAMINOPHEN 500 MG PO TABS
1000.0000 mg | ORAL_TABLET | Freq: Once | ORAL | Status: AC
  Administered 2024-02-13: 1000 mg via ORAL
  Filled 2024-02-13: qty 2

## 2024-02-13 MED ORDER — GADOBUTROL 1 MMOL/ML IV SOLN
7.5000 mL | Freq: Once | INTRAVENOUS | Status: AC | PRN
  Administered 2024-02-13: 7.5 mL via INTRAVENOUS

## 2024-02-13 MED ORDER — SODIUM CHLORIDE 0.9 % IV BOLUS
1000.0000 mL | Freq: Once | INTRAVENOUS | Status: AC
  Administered 2024-02-13: 1000 mL via INTRAVENOUS

## 2024-02-13 MED ORDER — MORPHINE SULFATE (PF) 4 MG/ML IV SOLN
4.0000 mg | Freq: Once | INTRAVENOUS | Status: AC
  Administered 2024-02-13: 4 mg via INTRAVENOUS
  Filled 2024-02-13: qty 1

## 2024-02-13 NOTE — ED Provider Triage Note (Signed)
 Emergency Medicine Provider Triage Evaluation Note  John [name] , a 68 y.o. male  was evaluated in triage.  Pt complains of pain in weakness in bilateral lower extremities.  Mostly in the proximal lower extremities.  He states it feels as if I ran a lot, but I have not.  He states he broke 3 ribs back in May.  Has had multiple scans since then.  Recently with liver cancer.  Review of Systems  Positive: As above Negative: As above  Physical Exam  BP (!) 155/84 (BP Location: Right Arm)   Pulse 95   Temp 98.1 F (36.7 C)   Resp 18   Ht 6' (1.829 m)   Wt 77.6 kg   SpO2 94%   BMI 23.19 kg/m  Gen:   Awake, no distress   Resp:  Normal effort  MSK:   Moves extremities without difficulty  Other:    Medical Decision Making  Medically screening exam initiated at 12:16 PM.  Appropriate orders placed.  John [name] was informed that the remainder of the evaluation will be completed by another provider, this initial triage assessment does not replace that evaluation, and the importance of remaining in the ED until their evaluation is complete.    Hildegard Loge, PA-C 02/13/24 1218

## 2024-02-13 NOTE — ED Triage Notes (Signed)
 Pt here form home with bil leg weakness , pt states that the  pain and weakness got worse yesterday

## 2024-02-13 NOTE — ED Provider Notes (Signed)
 John [name] EMERGENCY DEPARTMENT AT [facility] Provider Note   CSN: [id number] Arrival date & time: 02/13/24  1145     Patient presents with: Leg Pain   John [name] is a 68 y.o. male.   68 yo M with a chief complaints of low back pain that radiates down the legs.  Hurts worse to the right side but feels it is bilateral.  Like his legs got progressively weak and he is worried that he is not able to walk effectively.  He was able to do PT yesterday without much issue.  He says his legs are hurting worse today.  He denies fevers or chills.  He did have a spinal injection that he does not think helped all that much.  He thinks his got worse since then.  He had CT imaging with contrast done recently to evaluate new liver mass and he felt like the contrast made his leg pain worse.  He had called his doctor today who told him to come in to be evaluated for a DVT.   Leg Pain      Prior to Admission medications   Medication Sig Start Date End Date Taking? Authorizing Provider  diclofenac  Sodium (VOLTAREN ) 1 % GEL Apply 4 g topically 4 (four) times daily. 02/13/24  Yes Emil Share, DO  methylPREDNISolone  (MEDROL  DOSEPAK) 4 MG TBPK tablet Day 1: 8mg  before breakfast, 4 mg after lunch, 4 mg after supper, and 8 mg at bedtime Day 2: 4 mg before breakfast, 4 mg after lunch, 4 mg  after supper, and 8 mg  at bedtime Day 3:  4 mg  before breakfast, 4 mg  after lunch, 4 mg after supper, and 4 mg  at bedtime Day 4: 4 mg  before breakfast, 4 mg  after lunch, and 4 mg at bedtime Day 5: 4 mg  before breakfast and 4 mg at bedtime Day 6: 4 mg  before breakfast 02/13/24  Yes Emil Share, DO  ACCU-CHEK AVIVA PLUS test strip SMARTSIG:1 Strip(s) Via Meter 5 Times Daily 06/09/20   [provider]  albuterol (VENTOLIN HFA) 108 (90 Base) MCG/ACT inhaler albuterol sulfate HFA 90 mcg/actuation aerosol inhaler  INHALE 2 PUFFS BY MOUTH EVERY 4 HOURS AS NEEDED FOR WHEEZING 07/01/19   [provider]   azelastine (ASTELIN) 0.1 % nasal spray azelastine 137 mcg (0.1 %) nasal spray aerosol  USE 2 SPRAY(S) IN EACH NOSTRIL 4 TIMES DAILY FOR 3 5 DAYS . THEN USE TWICE DAILY    [provider]  Continuous Blood Gluc Receiver (FREESTYLE LIBRE 2 READER) DEVI Scan as needed for continuous glucose monitoring. 06/15/20   [provider]  Continuous Blood Gluc Sensor (FREESTYLE LIBRE 2 SENSOR) MISC Apply topically. 06/15/20   [provider]  diclofenac  (FLECTOR ) 1.3 % PTCH Place 1 patch onto the skin 2 (two) times daily. 10/24/23   [name], MD  gabapentin  (NEURONTIN ) 100 MG capsule Take 1 capsule by mouth 3 (three) times daily. 03/30/14   [provider]  gabapentin  (NEURONTIN ) 300 MG capsule Take 1 capsule by mouth at bedtime. 08/09/17   [provider]  glucagon 1 MG injection Inject 1 mg into the vein once as needed.    [provider]  Glucagon 3 MG/DOSE POWD Place into the nose. 01/11/21   [provider]  HYDROcodone -acetaminophen  (NORCO/VICODIN) 5-325 MG tablet Take 1 tablet by mouth every 6 (six) hours as needed. 10/24/23   [name], MD  insulin  aspart (NOVOLOG )  100 unit/mL injection Inject into the skin.    [provider]  insulin  glargine (LANTUS) 100 UNIT/ML injection Inject 30 Units into the skin daily.    [provider]  Insulin  Pen Needle 31G X 8 MM MISC Injects 4 times daily 02/17/14   [provider]  ketoconazole  (NIZORAL ) 2 % cream Apply 1 application topically daily. 07/05/18   [name], [name]  lactulose (CHRONULAC) 10 GM/15ML solution as needed. 08/10/19   [provider]  NOVOLOG  FLEXPEN 100 UNIT/ML FlexPen See admin instructions.  INJECT 4 UNITS SUBCUTANEOUSLY THREE TIMES DAILY PLUS SLIDING SCALE BEFORE MEALS. TDD = 30 UNITS 04/23/22   [provider]  sildenafil (REVATIO) 20 MG tablet See admin instructions. Take 1-5 tabs at least 45 minutes before activity PRN 03/21/23    [provider]    Allergies: Sulfa antibiotics, Sulfamethoxazole, and Testosterone    Review of Systems  Updated Vital Signs BP 120/77   Pulse 67   Temp 98.6 F (37 C) (Oral)   Resp 18   Ht 6' (1.829 m)   Wt 77.6 kg   SpO2 98%   BMI 23.19 kg/m   Physical Exam Vitals and nursing note reviewed.  Constitutional:      Appearance: He is well-developed.  HENT:     Head: Normocephalic and atraumatic.  Eyes:     Pupils: Pupils are equal, round, and reactive to light.  Neck:     Vascular: No JVD.  Cardiovascular:     Rate and Rhythm: Normal rate and regular rhythm.     Heart sounds: No murmur heard.    No friction rub. No gallop.  Pulmonary:     Effort: No respiratory distress.     Breath sounds: No wheezing.  Abdominal:     General: There is no distension.     Tenderness: There is no abdominal tenderness. There is no guarding or rebound.  Musculoskeletal:        General: Normal range of motion.     Cervical back: Normal range of motion and neck supple.     Comments: No obvious midline spinal tenderness step-offs or deformities.  Pulse motor and sensation are intact in bilateral lower extremities.  Reflexes are 2+ and equal.  There is no clonus.  Skin:    Coloration: Skin is not pale.     Findings: No rash.  Neurological:     Mental Status: He is alert and oriented to person, place, and time.  Psychiatric:        Behavior: Behavior normal.     (all labs ordered are listed, but only abnormal results are displayed) Labs Reviewed  CBC WITH DIFFERENTIAL/PLATELET - Abnormal; Notable for the following components:      Result Value   Platelets 135 (*)    Lymphs Abs 0.6 (*)    All other components within normal limits  COMPREHENSIVE METABOLIC PANEL WITH GFR - Abnormal; Notable for the following components:   CO2 21 (*)    Glucose, Bld 319 (*)    Calcium 8.4 (*)    Albumin 2.6 (*)    AST 74 (*)    Alkaline Phosphatase 142 (*)    All other components within  normal limits  CK - Abnormal; Notable for the following components:   Total CK 448 (*)    All other components within normal limits  SEDIMENTATION RATE - Abnormal; Notable for the following components:   Sed Rate 19 (*)    All other components  within normal limits  I-STAT CHEM 8, ED - Abnormal; Notable for the following components:   Glucose, Bld 322 (*)    Calcium, Ion 1.12 (*)    TCO2 19 (*)    All other components within normal limits  MAGNESIUM  C-REACTIVE PROTEIN    EKG: None  Radiology: MR Lumbar Spine W Wo Contrast Result Date: 02/13/2024 EXAM: MR Lumbar Spine with and without intravenous contrast. 02/13/2024 06:46:00 PM TECHNIQUE: Multiplanar multisequence MRI of the lumbar spine was performed with and without the administration of intravenous contrast. COMPARISON: 01/11/2024 CLINICAL HISTORY: Low back pain, cauda equina syndrome suspected. FINDINGS: BONES AND ALIGNMENT: Normal vertebral body heights. Normal bone marrow signal. No abnormal enhancement. Normal alignment. SPINAL CORD: The conus terminates normally. SOFT TISSUES: No acute abnormality. L1-L2: No disc herniation. No spinal canal stenosis or neural foraminal narrowing. L2-L3: No disc herniation. No spinal canal stenosis or neural foraminal narrowing. L3-L4: Right foraminal disc protrusion is unchanged. There is no high grade foraminal stenosis, though the proximity to the exiting L3 nerve root could be a source of irritation. No central spinal canal stenosis. L4-L5: Disc bulge with narrowing of the lateral recesses. No central spinal canal or neural foraminal stenosis. L5-S1: Disc space narrowing with endplate spurring. No central spinal canal or neural foraminal stenosis. IMPRESSION: 1. No evidence of cauda equina syndrome. 2. Right foraminal disc protrusion at L3-L4, unchanged, with potential irritation of the exiting L3 nerve root. No high grade foraminal stenosis. Electronically signed by: Franky Stanford MD 02/13/2024 07:43  PM EDT RP Workstation: [id number]     Procedures   Medications Ordered in the ED  ketorolac  (TORADOL ) 15 MG/ML injection 15 mg (has no administration in time range)  sodium chloride  0.9 % bolus 1,000 mL (1,000 mLs Intravenous New Bag/Given 02/13/24 1815)  morphine  (PF) 4 MG/ML injection 4 mg (4 mg Intravenous Given 02/13/24 1803)  diazepam  (VALIUM ) tablet 5 mg (5 mg Oral Given 02/13/24 1759)  acetaminophen  (TYLENOL ) tablet 1,000 mg (1,000 mg Oral Given 02/13/24 1759)  gadobutrol  (GADAVIST ) 1 MMOL/ML injection 7.5 mL (7.5 mLs Intravenous Contrast Given 02/13/24 1842)                                    Medical Decision Making Amount and/or Complexity of Data Reviewed Labs: ordered. Radiology: ordered.  Risk OTC drugs. Prescription drug management.   68 yo M with a chief complaints of low back pain.  This been an ongoing issue for him.  He has had issues for about 3 weeks.  Has had multiple presentations to the hospital he tells me has had at least 3 CT scans and an MRI for this.  He is seeing pain management and had a spinal injection done a couple weeks ago.  Since then he feels like his leg pains got worse and they have been more weak than normal.  He had called his primary care physician who was worried about a DVT and sent him here for evaluation.  I had a long discussion with the patient and family at bedside.  I discussed the typical course for radicular low back pain.  I discussed the limitation of MRI.  I also discussed how it would be uncommon to have bilateral DVTs without leg edema.  After some discussion the patient and family would prefer to have the MRI and DVT studies performed.  Will send off inflammatory markers.  MRI is negative for  acute cauda equina syndrome or epidural abscess or epidural hematoma.  I discussed results with patient and family.  We are still awaiting a DVT study he would prefer to go home at this time.  Will place a order to have it done tomorrow as an  outpatient.  PCP follow-up.  8:21 PM:  I have discussed the diagnosis/risks/treatment options with the patient.  Evaluation and diagnostic testing in the emergency department does not suggest an emergent condition requiring admission or immediate intervention beyond what has been performed at this time.  They will follow up with PCP. We also discussed returning to the ED immediately if new or worsening sx occur. We discussed the sx which are most concerning (e.g., sudden worsening pain, fever, inability to tolerate by mouth cauda equina s/sx) that necessitate immediate return. Medications administered to the patient during their visit and any new prescriptions provided to the patient are listed below.  Medications given during this visit Medications  ketorolac  (TORADOL ) 15 MG/ML injection 15 mg (has no administration in time range)  sodium chloride  0.9 % bolus 1,000 mL (1,000 mLs Intravenous New Bag/Given 02/13/24 1815)  morphine  (PF) 4 MG/ML injection 4 mg (4 mg Intravenous Given 02/13/24 1803)  diazepam  (VALIUM ) tablet 5 mg (5 mg Oral Given 02/13/24 1759)  acetaminophen  (TYLENOL ) tablet 1,000 mg (1,000 mg Oral Given 02/13/24 1759)  gadobutrol  (GADAVIST ) 1 MMOL/ML injection 7.5 mL (7.5 mLs Intravenous Contrast Given 02/13/24 1842)     The patient appears reasonably screen and/or stabilized for discharge and I doubt any other medical condition or other [facility] requiring further screening, evaluation, or treatment in the ED at this time prior to discharge.       Final diagnoses:  Bilateral leg pain    ED Discharge Orders          Ordered    LE VENOUS        02/13/24 2018    methylPREDNISolone  (MEDROL  DOSEPAK) 4 MG TBPK tablet        02/13/24 2018    diclofenac  Sodium (VOLTAREN ) 1 % GEL  4 times daily        02/13/24 2018               Emil Share, DO 02/13/24 2021

## 2024-02-13 NOTE — ED Notes (Addendum)
 Patient just had MRI in August that showed disc protusion Patient reports worsening pain and pain down bilateral legs and feeling like his legs are going to give out. Recently diagnosed with Liver Cancer. Denies incontinence

## 2024-02-13 NOTE — ED Notes (Signed)
 Pt transported to MRI

## 2024-02-13 NOTE — Discharge Instructions (Signed)
Your back pain is most likely due to a muscular strain.  There is been a lot of research on back pain, unfortunately the only thing that seems to really help is Tylenol and ibuprofen.  Relative rest is also important to not lift greater than 10 pounds bending or twisting at the waist.  Please follow-up with your family physician.  The other thing that really seems to benefit patients is physical therapy which your doctor may send you for.  Please return to the emergency department for new numbness or weakness to your arms or legs. Difficulty with urinating or urinating or pooping on yourself.  Also if you cannot feel toilet paper when you wipe or get a fever.   Use the gel as prescribed.  Take the steroids as prescribed Also take tylenol 1000mg (2 extra strength) four times a day.

## 2024-02-14 ENCOUNTER — Ambulatory Visit (HOSPITAL_COMMUNITY)
Admission: RE | Admit: 2024-02-14 | Discharge: 2024-02-14 | Disposition: A | Discharge status: Home / Self Care | Source: Ambulatory Visit | Attending: Emergency Medicine | Admitting: Emergency Medicine

## 2024-02-14 DIAGNOSIS — M79605 Pain in left leg: Secondary | ICD-10-CM | POA: Insufficient documentation

## 2024-02-14 DIAGNOSIS — M79604 Pain in right leg: Secondary | ICD-10-CM | POA: Insufficient documentation

## 2024-02-14 DIAGNOSIS — M7989 Other specified soft tissue disorders: Secondary | ICD-10-CM

## 2024-02-14 DIAGNOSIS — M545 Low back pain, unspecified: Secondary | ICD-10-CM | POA: Diagnosis present

## 2024-03-30 NOTE — Assessment & Plan Note (Signed)
-  managed by hematology with therapeutic phlebotomies as needed since at least 2008. -CT 01/2024 showed a new liver lesion 3.0X2.7cm in left lobe, I recommend liver MRI for further evaluation

## 2024-03-30 NOTE — Assessment & Plan Note (Signed)
 Continue to f/u with Dr. Sherell  -Continue HCC screening by AFP and abdominal ultrasound every 6 months here

## 2024-03-31 ENCOUNTER — Inpatient Hospital Stay

## 2024-03-31 ENCOUNTER — Inpatient Hospital Stay: Attending: Hematology | Admitting: Hematology

## 2024-03-31 DIAGNOSIS — C220 Liver cell carcinoma: Secondary | ICD-10-CM | POA: Diagnosis not present

## 2024-03-31 DIAGNOSIS — E1140 Type 2 diabetes mellitus with diabetic neuropathy, unspecified: Secondary | ICD-10-CM | POA: Diagnosis not present

## 2024-03-31 DIAGNOSIS — K7469 Other cirrhosis of liver: Secondary | ICD-10-CM

## 2024-03-31 LAB — CMP (CANCER CENTER ONLY)
ALT: 37 U/L (ref 0–44)
AST: 61 U/L — ABNORMAL HIGH (ref 15–41)
Albumin: 3.1 g/dL — ABNORMAL LOW (ref 3.5–5.0)
Alkaline Phosphatase: 123 U/L (ref 38–126)
Anion gap: 5 (ref 5–15)
BUN: 11 mg/dL (ref 8–23)
CO2: 26 mmol/L (ref 22–32)
Calcium: 8.8 mg/dL — ABNORMAL LOW (ref 8.9–10.3)
Chloride: 105 mmol/L (ref 98–111)
Creatinine: 0.75 mg/dL (ref 0.61–1.24)
GFR, Estimated: >60 mL/min (ref >60)
Glucose, Bld: 286 mg/dL — ABNORMAL HIGH (ref 70–99)
Potassium: 4.2 mmol/L (ref 3.5–5.1)
Sodium: 136 mmol/L (ref 135–145)
Total Bilirubin: 0.7 mg/dL (ref 0.0–1.2)
Total Protein: 6.6 g/dL (ref 6.5–8.1)

## 2024-03-31 LAB — IRON AND IRON BINDING CAPACITY (CC-WL,HP ONLY)
Iron: 138 ug/dL (ref 45–182)
Saturation Ratios: 41 % — ABNORMAL HIGH (ref 17.9–39.5)
TIBC: 333 ug/dL (ref 250–450)
UIBC: 195 ug/dL (ref 117–376)

## 2024-03-31 LAB — CBC WITH DIFFERENTIAL (CANCER CENTER ONLY)
Abs Immature Granulocytes: 0.02 K/uL (ref 0.00–0.07)
Basophils Absolute: 0 K/uL (ref 0.0–0.1)
Basophils Relative: 1 %
Eosinophils Absolute: 0.4 K/uL (ref 0.0–0.5)
Eosinophils Relative: 12 %
HCT: 39.1 % (ref 39.0–52.0)
Hemoglobin: 13.8 g/dL (ref 13.0–17.0)
Immature Granulocytes: 1 %
Lymphocytes Relative: 17 %
Lymphs Abs: 0.5 K/uL — ABNORMAL LOW (ref 0.7–4.0)
MCH: 31.9 pg (ref 26.0–34.0)
MCHC: 35.3 g/dL (ref 30.0–36.0)
MCV: 90.3 fL (ref 80.0–100.0)
Monocytes Absolute: 0.3 K/uL (ref 0.1–1.0)
Monocytes Relative: 10 %
Neutro Abs: 1.9 K/uL (ref 1.7–7.7)
Neutrophils Relative %: 59 %
Platelet Count: 110 K/uL — ABNORMAL LOW (ref 150–400)
RBC: 4.33 MIL/uL (ref 4.22–5.81)
RDW: 13.7 % (ref 11.5–15.5)
WBC Count: 3.1 K/uL — ABNORMAL LOW (ref 4.0–10.5)
nRBC: 0 % (ref 0.0–0.2)

## 2024-03-31 LAB — FERRITIN: Ferritin: 60 ng/mL (ref 24–336)

## 2024-03-31 MED ORDER — GABAPENTIN 100 MG PO CAPS: 100.0000 mg | ORAL_CAPSULE | Freq: Two times a day (BID) | ORAL | 1 refills | Status: AC

## 2024-03-31 MED ORDER — GABAPENTIN 300 MG PO CAPS: 300.0000 mg | ORAL_CAPSULE | Freq: Every day | ORAL | 1 refills | Status: AC

## 2024-03-31 NOTE — Assessment & Plan Note (Signed)
-   Incidental finding on CT in August 2025. -Subsequent to liver MRI and CT confirmed 3.0 cm mass in the segment 2, consistent with [facility]  - He is scheduled for liver directed therapy at [facility], and he is scheduled to meet their transplant team also.

## 2024-03-31 NOTE — Progress Notes (Signed)
 [facility]   [phone] [phone]   Clinic Follow up Note   Patient Care Team: Shepperson, Kirstin, PA-C as PCP - General (Family Medicine) Sherell Leisure, MD as Referring Physician (Internal Medicine) Lanny Callander, MD as Attending Physician (Hematology and Oncology)  Date of Service:  03/31/2024  CHIEF COMPLAINT: f/u of hemochromatosis  CURRENT THERAPY:  Phlebotomy as needed if ferritin above 50 or iron saturation more than 50%  Oncology History   Hemochromatosis -managed by hematology with therapeutic phlebotomies as needed since at least 2008. - I recommend HCC screening  Liver cirrhosis (HCC) Continue to f/u with Dr. Sherell  -Continue HCC screening by AFP and abdominal ultrasound every 6 months here    Hepatocellular carcinoma [facility]) - Incidental finding on CT in August 2025. -Subsequent to liver MRI and CT confirmed 3.0 cm mass in the segment 2, consistent with [facility]  - He is scheduled for liver directed therapy at [facility], and he is scheduled to meet their transplant team also.  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 Hemochromatosis is a known risk factor for liver cancer. - Proceed with Y90 radioembolization therapy as scheduled. - Coordinate with the transplant team in [place] for evaluation and potential listing for liver transplant. - Monitor liver function and cancer status post-treatment with MRI every 3-6 months. - Consider follow-up care in [place] for convenience.  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 Ferritin levels were previously high, but current levels are pending. He is not anemic, and phlebotomy is deferred due to current stress and upcoming procedures. - Hold phlebotomy for now and reassess ferritin levels once results are available. - Schedule  phlebotomy within the next four weeks if ferritin levels remain elevated.  Type 2 diabetes mellitus with diabetic neuropathy Type 2 diabetes with associated diabetic neuropathy. Blood sugar levels are elevated, likely due to recent food intake and medication timing. He has been on gabapentin  for neuropathy management, but there was an issue with prescription refills from the primary care provider. - Refill gabapentin  prescription: 300 mg at night and 100 mg twice daily. - Follow up with primary care provider to discuss ongoing management of diabetes and neuropathy.   Plan - I reviewed his recent MRI and CT reports, he is scheduled to have liver directed therapy by IR in [facility] soon, he will also meet their transplant team. - Lab reviewed today, ferritin level is still pending.  Will call him if he needs phlebotomy  - Continue lab every 3 months with phlebotomy appointment 1 week after if needed - Follow-up with me in 9 months - I refilled his gabapentin  today and encouraged him to review with his PCP, future refill will be by his PCP.  Discussed the use of AI scribe software for clinical note transcription with the patient, who gave verbal consent to proceed.  History of Present Illness John [name] is a 68 year old male with hemochromatosis who presents for follow-up.  He was recently diagnosed with liver cancer, discovered incidentally during a CT scan after a fall. He is scheduled to start radiation therapy tomorrow and is being evaluated for a liver transplant in [place]. He is concerned about feeling unwell after the radiation therapy and is considering canceling a scheduled visit to [facility] due to this.  He has hemochromatosis and undergoes regular phlebotomy treatments. He recalls having a phlebotomy in August and is unsure if he  needs another one today.  He takes gabapentin  for neuropathy related to diabetes, with no drowsiness reported from its use.     All other  systems were reviewed with the patient and are negative.  MEDICAL HISTORY:  Past Medical History:  Diagnosis Date   Anxiety    Diabetes (HCC)    Foot pain left   to have MRI tues.    Hepatitis C    High cholesterol     SURGICAL HISTORY: Past Surgical History:  Procedure Laterality Date   None      I have reviewed the social history and family history with the patient and they are unchanged from previous note.  ALLERGIES:  is allergic to sulfa antibiotics, sulfamethoxazole, and testosterone.  MEDICATIONS:  Current Outpatient Medications  Medication Sig Dispense Refill   ACCU-CHEK AVIVA PLUS test strip SMARTSIG:1 Strip(s) Via Meter 5 Times Daily     albuterol (VENTOLIN HFA) 108 (90 Base) MCG/ACT inhaler albuterol sulfate HFA 90 mcg/actuation aerosol inhaler  INHALE 2 PUFFS BY MOUTH EVERY 4 HOURS AS NEEDED FOR WHEEZING     azelastine (ASTELIN) 0.1 % nasal spray azelastine 137 mcg (0.1 %) nasal spray aerosol  USE 2 SPRAY(S) IN EACH NOSTRIL 4 TIMES DAILY FOR 3 5 DAYS . THEN USE TWICE DAILY     Continuous Blood Gluc Receiver (FREESTYLE LIBRE 2 READER) DEVI Scan as needed for continuous glucose monitoring.     Continuous Blood Gluc Sensor (FREESTYLE LIBRE 2 SENSOR) MISC Apply topically.     diclofenac  (FLECTOR ) 1.3 % PTCH Place 1 patch onto the skin 2 (two) times daily. 5 patch 1   diclofenac  Sodium (VOLTAREN ) 1 % GEL Apply 4 g topically 4 (four) times daily. 100 g 0   glucagon 1 MG injection Inject 1 mg into the vein once as needed.     Glucagon 3 MG/DOSE POWD Place into the nose.     HYDROcodone -acetaminophen  (NORCO/VICODIN) 5-325 MG tablet Take 1 tablet by mouth every 6 (six) hours as needed. 12 tablet 0   insulin  aspart (NOVOLOG ) 100 unit/mL injection Inject into the skin.     insulin  glargine (LANTUS) 100 UNIT/ML injection Inject 30 Units into the skin daily.     Insulin  Pen Needle 31G X 8 MM MISC Injects 4 times daily     ketoconazole  (NIZORAL ) 2 % cream Apply 1 application  topically daily. 60 g 2   lactulose (CHRONULAC) 10 GM/15ML solution as needed.     methylPREDNISolone  (MEDROL  DOSEPAK) 4 MG TBPK tablet Day 1: 8mg  before breakfast, 4 mg after lunch, 4 mg after supper, and 8 mg at bedtime Day 2: 4 mg before breakfast, 4 mg after lunch, 4 mg  after supper, and 8 mg  at bedtime Day 3:  4 mg  before breakfast, 4 mg  after lunch, 4 mg after supper, and 4 mg  at bedtime Day 4: 4 mg  before breakfast, 4 mg  after lunch, and 4 mg at bedtime Day 5: 4 mg  before breakfast and 4 mg at bedtime Day 6: 4 mg  before breakfast 1 each 0   NOVOLOG  FLEXPEN 100 UNIT/ML FlexPen See admin instructions.  INJECT 4 UNITS SUBCUTANEOUSLY THREE TIMES DAILY PLUS SLIDING SCALE BEFORE MEALS. TDD = 30 UNITS     sildenafil (REVATIO) 20 MG tablet See admin instructions. Take 1-5 tabs at least 45 minutes before activity PRN     gabapentin  (NEURONTIN ) 100 MG capsule Take 1 capsule (100 mg total) by mouth 2 (two) times  daily. 180 capsule 1   gabapentin  (NEURONTIN ) 300 MG capsule Take 1 capsule (300 mg total) by mouth at bedtime. 90 capsule 1   No current facility-administered medications for this visit.    PHYSICAL EXAMINATION: ECOG PERFORMANCE STATUS: 1 - Symptomatic but completely ambulatory  Vitals:   03/31/24 0834  BP: 136/70  Pulse: 79  Resp: 17  Temp: 98.3 F (36.8 C)  SpO2: 95%   Wt Readings from Last 3 Encounters:  03/31/24 169 lb 4.8 oz (76.8 kg)  02/13/24 171 lb (77.6 kg)  01/11/24 171 lb 15.3 oz (78 kg)     GENERAL:alert, no distress and comfortable SKIN: skin color, texture, turgor are normal, no rashes or significant lesions EYES: normal, Conjunctiva are pink and non-injected, sclera clear NECK: supple, thyroid normal size, non-tender, without nodularity LYMPH:  no palpable lymphadenopathy in the cervical, axillary  LUNGS: clear to auscultation and percussion with normal breathing effort HEART: regular rate & rhythm and no murmurs and no lower extremity  edema ABDOMEN:abdomen soft, non-tender and normal bowel sounds Musculoskeletal:no cyanosis of digits and no clubbing  NEURO: alert & oriented x 3 with fluent speech, no focal motor/sensory deficits  Physical Exam    LABORATORY DATA:  I have reviewed the data as listed    Latest Ref Rng & Units 03/31/2024    7:58 AM 02/13/2024   12:18 PM 02/13/2024   12:13 PM  CBC  WBC 4.0 - 10.5 K/uL 3.1   4.1   Hemoglobin 13.0 - 17.0 g/dL 86.1  86.0  86.3   Hematocrit 39.0 - 52.0 % 39.1  41.0  39.7   Platelets 150 - 400 K/uL 110   135         Latest Ref Rng & Units 03/31/2024    7:58 AM 02/13/2024   12:18 PM 02/13/2024   12:13 PM  CMP  Glucose 70 - 99 mg/dL 713  677  680   BUN 8 - 23 mg/dL 11  10  9    Creatinine 0.61 - 1.24 mg/dL 9.24  9.09  9.05   Sodium 135 - 145 mmol/L 136  135  135   Potassium 3.5 - 5.1 mmol/L 4.2  4.1  4.0   Chloride 98 - 111 mmol/L 105  101  102   CO2 22 - 32 mmol/L 26   21   Calcium 8.9 - 10.3 mg/dL 8.8   8.4   Total Protein 6.5 - 8.1 g/dL 6.6   6.6   Total Bilirubin 0.0 - 1.2 mg/dL 0.7   1.2   Alkaline Phos 38 - 126 U/L 123   142   AST 15 - 41 U/L 61   74   ALT 0 - 44 U/L 37   39       RADIOGRAPHIC STUDIES: I have personally reviewed the radiological images as listed and agreed with the findings in the report. No results found.    No orders of the defined types were placed in this encounter.  All questions were answered. The patient knows to call the clinic with any problems, questions or concerns. No barriers to learning was detected. The total time spent in the appointment was 30 minutes, including review of chart and various tests results, discussions about plan of care and coordination of care plan     Onita Mattock, MD 03/31/2024

## 2024-04-01 ENCOUNTER — Other Ambulatory Visit: Payer: Self-pay

## 2024-04-01 NOTE — Progress Notes (Signed)
 Sent scheduling message to Brendia Daring to contact pt to get him scheduled for lab and therapeutic phlebotomy in 1 to 2 wks since pt's Ferritin was 60.  Per Dr. Lanny pt will need therapeutic phlebotomy.

## 2024-04-06 ENCOUNTER — Encounter: Payer: Self-pay | Admitting: Hematology

## 2024-04-15 ENCOUNTER — Inpatient Hospital Stay: Attending: Hematology

## 2024-04-15 ENCOUNTER — Inpatient Hospital Stay

## 2024-04-15 DIAGNOSIS — C220 Liver cell carcinoma: Secondary | ICD-10-CM | POA: Insufficient documentation

## 2024-04-15 DIAGNOSIS — K7469 Other cirrhosis of liver: Secondary | ICD-10-CM

## 2024-04-15 DIAGNOSIS — E860 Dehydration: Secondary | ICD-10-CM

## 2024-04-15 LAB — IRON AND IRON BINDING CAPACITY (CC-WL,HP ONLY)
Iron: 123 ug/dL (ref 45–182)
Saturation Ratios: 50 % — ABNORMAL HIGH (ref 17.9–39.5)
TIBC: 248 ug/dL — ABNORMAL LOW (ref 250–450)
UIBC: 125 ug/dL (ref 117–376)

## 2024-04-15 LAB — CBC WITH DIFFERENTIAL (CANCER CENTER ONLY)
Abs Immature Granulocytes: 0.03 K/uL (ref 0.00–0.07)
Basophils Absolute: 0 K/uL (ref 0.0–0.1)
Basophils Relative: 0 %
Eosinophils Absolute: 0.5 K/uL (ref 0.0–0.5)
Eosinophils Relative: 10 %
HCT: 36.3 % — ABNORMAL LOW (ref 39.0–52.0)
Hemoglobin: 13 g/dL (ref 13.0–17.0)
Immature Granulocytes: 1 %
Lymphocytes Relative: 4 %
Lymphs Abs: 0.2 K/uL — ABNORMAL LOW (ref 0.7–4.0)
MCH: 32.3 pg (ref 26.0–34.0)
MCHC: 35.8 g/dL (ref 30.0–36.0)
MCV: 90.3 fL (ref 80.0–100.0)
Monocytes Absolute: 0.5 K/uL (ref 0.1–1.0)
Monocytes Relative: 10 %
Neutro Abs: 3.7 K/uL (ref 1.7–7.7)
Neutrophils Relative %: 75 %
Platelet Count: 113 K/uL — ABNORMAL LOW (ref 150–400)
RBC: 4.02 MIL/uL — ABNORMAL LOW (ref 4.22–5.81)
RDW: 13.2 % (ref 11.5–15.5)
WBC Count: 4.9 K/uL (ref 4.0–10.5)
nRBC: 0 % (ref 0.0–0.2)

## 2024-04-15 LAB — CMP (CANCER CENTER ONLY)
ALT: 94 U/L — ABNORMAL HIGH (ref 0–44)
AST: 161 U/L — ABNORMAL HIGH (ref 15–41)
Albumin: 2.9 g/dL — ABNORMAL LOW (ref 3.5–5.0)
Alkaline Phosphatase: 200 U/L — ABNORMAL HIGH (ref 38–126)
Anion gap: 6 (ref 5–15)
BUN: 10 mg/dL (ref 8–23)
CO2: 24 mmol/L (ref 22–32)
Calcium: 8.4 mg/dL — ABNORMAL LOW (ref 8.9–10.3)
Chloride: 103 mmol/L (ref 98–111)
Creatinine: 0.72 mg/dL (ref 0.61–1.24)
GFR, Estimated: >60 mL/min (ref >60)
Glucose, Bld: 202 mg/dL — ABNORMAL HIGH (ref 70–99)
Potassium: 4 mmol/L (ref 3.5–5.1)
Sodium: 133 mmol/L — ABNORMAL LOW (ref 135–145)
Total Bilirubin: 0.8 mg/dL (ref 0.0–1.2)
Total Protein: 6.6 g/dL (ref 6.5–8.1)

## 2024-04-15 LAB — FERRITIN: Ferritin: 256 ng/mL (ref 24–336)

## 2024-04-15 NOTE — Progress Notes (Signed)
 Clell R Shreiner presents today for phlebotomy per MD orders. Phlebotomy procedure started at 1344 and ended at 1348. 542.67 grams removed. 16 G Phlebotomy kit used.  Patient tolerated procedure well. IV needle removed intact.   Patient stayed for 30 minute observation.  Pt provided with diet ginger ale and peanut butter crackers before leaving. VSS at discharge. Ambulatory to lobby w/o assistance.

## 2024-06-30 ENCOUNTER — Other Ambulatory Visit: Payer: Self-pay

## 2024-06-30 ENCOUNTER — Inpatient Hospital Stay: Attending: Hematology

## 2024-06-30 DIAGNOSIS — C220 Liver cell carcinoma: Secondary | ICD-10-CM

## 2024-06-30 LAB — CBC WITH DIFFERENTIAL (CANCER CENTER ONLY)
Abs Immature Granulocytes: 0.02 K/uL (ref 0.00–0.07)
Basophils Absolute: 0 K/uL (ref 0.0–0.1)
Basophils Relative: 0 %
Eosinophils Absolute: 0.3 K/uL (ref 0.0–0.5)
Eosinophils Relative: 10 %
HCT: 35.6 % — ABNORMAL LOW (ref 39.0–52.0)
Hemoglobin: 12.4 g/dL — ABNORMAL LOW (ref 13.0–17.0)
Immature Granulocytes: 1 %
Lymphocytes Relative: 11 %
Lymphs Abs: 0.4 K/uL — ABNORMAL LOW (ref 0.7–4.0)
MCH: 29.5 pg (ref 26.0–34.0)
MCHC: 34.8 g/dL (ref 30.0–36.0)
MCV: 84.6 fL (ref 80.0–100.0)
Monocytes Absolute: 0.5 K/uL (ref 0.1–1.0)
Monocytes Relative: 13 %
Neutro Abs: 2.4 K/uL (ref 1.7–7.7)
Neutrophils Relative %: 65 %
Platelet Count: 99 K/uL — ABNORMAL LOW (ref 150–400)
RBC: 4.21 MIL/uL — ABNORMAL LOW (ref 4.22–5.81)
RDW: 14.4 % (ref 11.5–15.5)
WBC Count: 3.6 K/uL — ABNORMAL LOW (ref 4.0–10.5)
nRBC: 0 % (ref 0.0–0.2)

## 2024-06-30 LAB — FERRITIN: Ferritin: 49 ng/mL (ref 24–336)

## 2024-07-07 ENCOUNTER — Inpatient Hospital Stay

## 2024-07-08 ENCOUNTER — Other Ambulatory Visit: Payer: Self-pay

## 2024-07-08 ENCOUNTER — Telehealth: Payer: Self-pay

## 2024-07-08 NOTE — Telephone Encounter (Signed)
 Pt called regarding his phlebotomy appt on 07/07/2024.  Stated the appt was cancelled per Dr Lanny since the pt's ferritin was 49. Pt is scheduled to come in again in April 2026 for lab & phlebotomy.  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  Stated this nurse will make Dr Lanny and her Team aware of pt's call.

## 2024-09-29 ENCOUNTER — Inpatient Hospital Stay: Attending: Hematology

## 2024-10-06 ENCOUNTER — Inpatient Hospital Stay

## 2024-12-29 ENCOUNTER — Inpatient Hospital Stay: Attending: Hematology

## 2025-01-05 ENCOUNTER — Inpatient Hospital Stay

## 2025-01-05 ENCOUNTER — Inpatient Hospital Stay: Admitting: Hematology
